# Patient Record
Sex: Female | Born: 1965
Health system: Southern US, Community
[De-identification: ages and names within clinical notes are randomized; demographics above are authoritative.]

## PROBLEM LIST (undated history)

## (undated) DIAGNOSIS — F419 Anxiety disorder, unspecified: Secondary | ICD-10-CM

## (undated) DIAGNOSIS — M1990 Unspecified osteoarthritis, unspecified site: Secondary | ICD-10-CM

## (undated) DIAGNOSIS — E119 Type 2 diabetes mellitus without complications: Secondary | ICD-10-CM

## (undated) DIAGNOSIS — I10 Essential (primary) hypertension: Secondary | ICD-10-CM

## (undated) DIAGNOSIS — T7840XA Allergy, unspecified, initial encounter: Secondary | ICD-10-CM

## (undated) HISTORY — DX: Essential (primary) hypertension: I10

## (undated) HISTORY — DX: Anxiety disorder, unspecified: F41.9

## (undated) HISTORY — DX: Type 2 diabetes mellitus without complications: E11.9

## (undated) HISTORY — DX: Allergy, unspecified, initial encounter: T78.40XA

## (undated) HISTORY — PX: TONSILLECTOMY: SUR1361

---

## 2004-03-23 ENCOUNTER — Inpatient Hospital Stay: Payer: Self-pay

## 2004-04-05 HISTORY — PX: DILATION AND CURETTAGE OF UTERUS: SHX78

## 2004-09-01 ENCOUNTER — Ambulatory Visit: Payer: Self-pay

## 2004-12-30 ENCOUNTER — Inpatient Hospital Stay: Payer: Self-pay

## 2005-01-12 ENCOUNTER — Ambulatory Visit: Payer: Self-pay | Admitting: Obstetrics & Gynecology

## 2005-02-03 ENCOUNTER — Ambulatory Visit: Payer: Self-pay | Admitting: Obstetrics & Gynecology

## 2005-02-05 ENCOUNTER — Observation Stay: Payer: Self-pay

## 2005-02-26 ENCOUNTER — Observation Stay: Payer: Self-pay | Admitting: Obstetrics & Gynecology

## 2005-03-01 ENCOUNTER — Inpatient Hospital Stay: Payer: Self-pay

## 2005-03-05 ENCOUNTER — Ambulatory Visit: Payer: Self-pay | Admitting: Obstetrics & Gynecology

## 2007-08-17 ENCOUNTER — Ambulatory Visit: Payer: Self-pay | Admitting: Family Medicine

## 2013-03-05 ENCOUNTER — Ambulatory Visit (INDEPENDENT_AMBULATORY_CARE_PROVIDER_SITE_OTHER): Payer: 59 | Admitting: Family Medicine

## 2013-03-05 ENCOUNTER — Encounter: Payer: Self-pay | Admitting: Family Medicine

## 2013-03-05 VITALS — BP 140/98 | HR 83 | Ht 67.0 in | Wt 215.0 lb

## 2013-03-05 DIAGNOSIS — I119 Hypertensive heart disease without heart failure: Secondary | ICD-10-CM | POA: Insufficient documentation

## 2013-03-05 DIAGNOSIS — R7303 Prediabetes: Secondary | ICD-10-CM | POA: Insufficient documentation

## 2013-03-05 DIAGNOSIS — R7309 Other abnormal glucose: Secondary | ICD-10-CM

## 2013-03-05 DIAGNOSIS — I10 Essential (primary) hypertension: Secondary | ICD-10-CM

## 2013-03-05 DIAGNOSIS — Z1151 Encounter for screening for human papillomavirus (HPV): Secondary | ICD-10-CM

## 2013-03-05 DIAGNOSIS — Z124 Encounter for screening for malignant neoplasm of cervix: Secondary | ICD-10-CM

## 2013-03-05 DIAGNOSIS — Z01419 Encounter for gynecological examination (general) (routine) without abnormal findings: Secondary | ICD-10-CM

## 2013-03-05 DIAGNOSIS — Z3041 Encounter for surveillance of contraceptive pills: Secondary | ICD-10-CM

## 2013-03-05 LAB — CBC
Hemoglobin: 13.5 g/dL (ref 12.0–15.0)
MCH: 29 pg (ref 26.0–34.0)
MCHC: 34.5 g/dL (ref 30.0–36.0)
MCV: 83.9 fL (ref 78.0–100.0)
RDW: 14.2 % (ref 11.5–15.5)

## 2013-03-05 MED ORDER — NORGESTIMATE-ETH ESTRADIOL 0.25-35 MG-MCG PO TABS: 1.0000 | ORAL_TABLET | Freq: Every day | ORAL | Status: DC

## 2013-03-05 NOTE — Progress Notes (Signed)
  Subjective:     Mackenzie Clark is a 47 y.o. female and is here for a comprehensive physical exam. The patient reports no problems. Seen by PCP with pre-diabetes, treated with weight loss.  Last Hgb A1C was 6.2 on no meds.  On OC's for contraception and cycle control.  On 2 medications for her BP. Discussed contraceptive options that do not contain estrogen to assist with BP control.  Per pt., BP is usually not this far elevated.  Had normal mammogram in July.  She is a CMA at [employer].  History   Social History  . Marital Status: Married    Spouse Name: N/A    Number of Children: N/A  . Years of Education: N/A   Occupational History  . Not on file.   Social History Main Topics  . Smoking status: Never Smoker   . Smokeless tobacco: Never Used  . Alcohol Use: Yes     Comment: occasionally  . Drug Use: No  . Sexual Activity: Yes    Partners: Male    Birth Control/ Protection: OCP, Pill   Other Topics Concern  . Not on file   Social History Narrative  . No narrative on file   No health maintenance topics applied.  The following portions of the patient's history were reviewed and updated as appropriate: allergies, current medications, past family history, past medical history, past social history, past surgical history and problem list.  Review of Systems A comprehensive review of systems was negative.   Objective:    BP 140/98  Pulse 83  Ht 5\' 7"  (1.702 m)  Wt 215 lb (97.523 kg)  BMI 33.67 kg/m2  LMP 02/28/2013 General appearance: alert, cooperative and appears stated age Head: Normocephalic, without obvious abnormality, atraumatic Neck: no adenopathy, supple, symmetrical, trachea midline and thyroid not enlarged, symmetric, no tenderness/mass/nodules Lungs: clear to auscultation bilaterally Breasts: normal appearance, no masses or tenderness Heart: regular rate and rhythm, S1, S2 normal, no murmur, click, rub or gallop Abdomen: soft, non-tender; bowel sounds  normal; no masses,  no organomegaly Pelvic: cervix normal in appearance, external genitalia normal, no adnexal masses or tenderness, no cervical motion tenderness, uterus normal size, shape, and consistency and vagina normal without discharge Extremities: extremities normal, atraumatic, no cyanosis or edema Pulses: 2+ and symmetric Skin: Skin color, texture, turgor normal. No rashes or lesions Lymph nodes: Cervical, supraclavicular, and axillary nodes normal. Neurologic: Grossly normal    Assessment:    Healthy female exam. HTN, pre-diabetes.      Plan:    Pap Smear--s/p flu and mammogram. Fasting blood work Contraception options reviewed.  Have refilled her OC's for now--she should watch BP and consider switch to progestin only or barrier method if BP remains high. See After Visit Summary for Counseling Recommendations

## 2013-03-05 NOTE — Patient Instructions (Signed)
Contraception Choices Contraception (birth control) is the use of any methods or devices to prevent pregnancy. Below are some methods to help avoid pregnancy. HORMONAL METHODS   Contraceptive implant This is a thin, plastic tube containing progesterone hormone. It does not contain estrogen hormone. Your health care provider inserts the tube in the inner part of the upper arm. The tube can remain in place for up to 3 years. After 3 years, the implant must be removed. The implant prevents the ovaries from releasing an egg (ovulation), thickens the cervical mucus to prevent sperm from entering the uterus, and thins the lining of the inside of the uterus.  Progesterone-only injections These injections are given every 3 months by your health care provider to prevent pregnancy. This synthetic progesterone hormone stops the ovaries from releasing eggs. It also thickens cervical mucus and changes the uterine lining. This makes it harder for sperm to survive in the uterus.  Birth control pills These pills contain estrogen and progesterone hormone. They work by preventing the ovaries from releasing eggs (ovulation). They also cause the cervical mucus to thicken, preventing the sperm from entering the uterus. Birth control pills are prescribed by a health care provider.Birth control pills can also be used to treat heavy periods.  Minipill This type of birth control pill contains only the progesterone hormone. They are taken every day of each month and must be prescribed by your health care provider.  Birth control patch The patch contains hormones similar to those in birth control pills. It must be changed once a week and is prescribed by a health care provider.  Vaginal ring The ring contains hormones similar to those in birth control pills. It is left in the vagina for 3 weeks, removed for 1 week, and then a new one is put back in place. The patient must be comfortable inserting and removing the ring from the  vagina.A health care provider's prescription is necessary.  Emergency contraception Emergency contraceptives prevent pregnancy after unprotected sexual intercourse. This pill can be taken right after sex or up to 5 days after unprotected sex. It is most effective the sooner you take the pills after having sexual intercourse. Most emergency contraceptive pills are available without a prescription. Check with your pharmacist. Do not use emergency contraception as your only form of birth control. BARRIER METHODS   Female condom This is a thin sheath (latex or rubber) that is worn over the penis during sexual intercourse. It can be used with spermicide to increase effectiveness.  Female condom. This is a soft, loose-fitting sheath that is put into the vagina before sexual intercourse.  Diaphragm This is a soft, latex, dome-shaped barrier that must be fitted by a health care provider. It is inserted into the vagina, along with a spermicidal jelly. It is inserted before intercourse. The diaphragm should be left in the vagina for 6 to 8 hours after intercourse.  Cervical cap This is a round, soft, latex or plastic cup that fits over the cervix and must be fitted by a health care provider. The cap can be left in place for up to 48 hours after intercourse.  Sponge This is a soft, circular piece of polyurethane foam. The sponge has spermicide in it. It is inserted into the vagina after wetting it and before sexual intercourse.  Spermicides These are chemicals that kill or block sperm from entering the cervix and uterus. They come in the form of creams, jellies, suppositories, foam, or tablets. They do not require a   prescription. They are inserted into the vagina with an applicator before having sexual intercourse. The process must be repeated every time you have sexual intercourse. INTRAUTERINE CONTRACEPTION  Intrauterine device (IUD) This is a T-shaped device that is put in a woman's uterus during a  menstrual period to prevent pregnancy. There are 2 types:  Copper IUD This type of IUD is wrapped in copper wire and is placed inside the uterus. Copper makes the uterus and fallopian tubes produce a fluid that kills sperm. It can stay in place for 10 years.  Hormone IUD This type of IUD contains the hormone progestin (synthetic progesterone). The hormone thickens the cervical mucus and prevents sperm from entering the uterus, and it also thins the uterine lining to prevent implantation of a fertilized egg. The hormone can weaken or kill the sperm that get into the uterus. It can stay in place for 3 5 years, depending on which type of IUD is used. PERMANENT METHODS OF CONTRACEPTION  Female tubal ligation This is when the woman's fallopian tubes are surgically sealed, tied, or blocked to prevent the egg from traveling to the uterus.  Hysteroscopic sterilization This involves placing a small coil or insert into each fallopian tube. Your doctor uses a technique called hysteroscopy to do the procedure. The device causes scar tissue to form. This results in permanent blockage of the fallopian tubes, so the sperm cannot fertilize the egg. It takes about 3 months after the procedure for the tubes to become blocked. You must use another form of birth control for these 3 months.  Female sterilization This is when the female has the tubes that carry sperm tied off (vasectomy).This blocks sperm from entering the vagina during sexual intercourse. After the procedure, the man can still ejaculate fluid (semen). NATURAL PLANNING METHODS  Natural family planning This is not having sexual intercourse or using a barrier method (condom, diaphragm, cervical cap) on days the woman could become pregnant.  Calendar method This is keeping track of the length of each menstrual cycle and identifying when you are fertile.  Ovulation method This is avoiding sexual intercourse during ovulation.  Symptothermal method This is  avoiding sexual intercourse during ovulation, using a thermometer and ovulation symptoms.  Post ovulation method This is timing sexual intercourse after you have ovulated. Regardless of which type or method of contraception you choose, it is important that you use condoms to protect against the transmission of sexually transmitted infections (STIs). Talk with your health care provider about which form of contraception is most appropriate for you. Document Released: 03/22/2005 Document Revised: 11/22/2012 Document Reviewed: 09/14/2012 [facility] Patient Information 2014 [place], [place].  Preventive Care for Adults, Female A healthy lifestyle and preventive care can promote health and wellness. Preventive health guidelines for women include the following key practices.  A routine yearly physical is a good way to check with your caregiver about your health and preventive screening. It is a chance to share any concerns and updates on your health, and to receive a thorough exam.  Visit your dentist for a routine exam and preventive care every 6 months. Brush your teeth twice a day and floss once a day. Good oral hygiene prevents tooth decay and gum disease.  The frequency of eye exams is based on your age, health, family medical history, use of contact lenses, and other factors. Follow your caregiver's recommendations for frequency of eye exams.  Eat a healthy diet. Foods like vegetables, fruits, whole grains, low-fat dairy products, and lean protein  foods contain the nutrients you need without too many calories. Decrease your intake of foods high in solid fats, added sugars, and salt. Eat the right amount of calories for you.Get information about a proper diet from your caregiver, if necessary.  Regular physical exercise is one of the most important things you can do for your health. Most adults should get at least 150 minutes of moderate-intensity exercise (any activity that increases your heart rate  and causes you to sweat) each week. In addition, most adults need muscle-strengthening exercises on 2 or more days a week.  Maintain a healthy weight. The body mass index (BMI) is a screening tool to identify possible weight problems. It provides an estimate of body fat based on height and weight. Your caregiver can help determine your BMI, and can help you achieve or maintain a healthy weight.For adults 20 years and older:  A BMI below 18.5 is considered underweight.  A BMI of 18.5 to 24.9 is normal.  A BMI of 25 to 29.9 is considered overweight.  A BMI of 30 and above is considered obese.  Maintain normal blood lipids and cholesterol levels by exercising and minimizing your intake of saturated fat. Eat a balanced diet with plenty of fruit and vegetables. Blood tests for lipids and cholesterol should begin at age 55 and be repeated every 5 years. If your lipid or cholesterol levels are high, you are over 50, or you are at high risk for heart disease, you may need your cholesterol levels checked more frequently.Ongoing high lipid and cholesterol levels should be treated with medicines if diet and exercise are not effective.  If you smoke, find out from your caregiver how to quit. If you do not use tobacco, do not start.  Lung cancer screening is recommended for adults aged 46 80 years who are at high risk for developing lung cancer because of a history of smoking. Yearly low-dose computed tomography (CT) is recommended for people who have at least a 30-pack-year history of smoking and are a current smoker or have quit within the past 15 years. A pack year of smoking is smoking an average of 1 pack of cigarettes a day for 1 year (for example: 1 pack a day for 30 years or 2 packs a day for 15 years). Yearly screening should continue until the smoker has stopped smoking for at least 15 years. Yearly screening should also be stopped for people who develop a health problem that would prevent them from  having lung cancer treatment.  If you are pregnant, do not drink alcohol. If you are breastfeeding, be very cautious about drinking alcohol. If you are not pregnant and choose to drink alcohol, do not exceed 1 drink per day. One drink is considered to be 12 ounces (355 mL) of beer, 5 ounces (148 mL) of wine, or 1.5 ounces (44 mL) of liquor.  Avoid use of street drugs. Do not share needles with anyone. Ask for help if you need support or instructions about stopping the use of drugs.  High blood pressure causes heart disease and increases the risk of stroke. Your blood pressure should be checked at least every 1 to 2 years. Ongoing high blood pressure should be treated with medicines if weight loss and exercise are not effective.  If you are 68 to 45 years old, ask your caregiver if you should take aspirin to prevent strokes.  Diabetes screening involves taking a blood sample to check your fasting blood sugar level. This should  be done once every 3 years, after age 28, if you are within normal weight and without risk factors for diabetes. Testing should be considered at a younger age or be carried out more frequently if you are overweight and have at least 1 risk factor for diabetes.  Breast cancer screening is essential preventive care for women. You should practice "breast self-awareness." This means understanding the normal appearance and feel of your breasts and may include breast self-examination. Any changes detected, no matter how small, should be reported to a caregiver. Women in their 110s and 30s should have a clinical breast exam (CBE) by a caregiver as part of a regular health exam every 1 to 3 years. After age 54, women should have a CBE every year. Starting at age 20, women should consider having a mammography (breast X-ray test) every year. Women who have a family history of breast cancer should talk to their caregiver about genetic screening. Women at a high risk of breast cancer should talk  to their caregivers about having magnetic resonance imaging (MRI) and a mammography every year.  Breast cancer gene (BRCA)-related cancer risk assessment is recommended for women who have family members with BRCA-related cancers. BRCA-related cancers include breast, ovarian, tubal, and peritoneal cancers. Having family members with these cancers may be associated with an increased risk for harmful changes (mutations) in the breast cancer genes BRCA1 and BRCA2. Results of the assessment will determine the need for genetic counseling and BRCA1 and BRCA2 testing.  The Pap test is a screening test for cervical cancer. A Pap test can show cell changes on the cervix that might become cervical cancer if left untreated. A Pap test is a procedure in which cells are obtained and examined from the lower end of the uterus (cervix).  Women should have a Pap test starting at age 68.  Between ages 35 and 61, Pap tests should be repeated every 2 years.  Beginning at age 64, you should have a Pap test every 3 years as long as the past 3 Pap tests have been normal.  Some women have medical problems that increase the chance of getting cervical cancer. Talk to your caregiver about these problems. It is especially important to talk to your caregiver if a new problem develops soon after your last Pap test. In these cases, your caregiver may recommend more frequent screening and Pap tests.  The above recommendations are the same for women who have or have not gotten the vaccine for human papillomavirus (HPV).  If you had a hysterectomy for a problem that was not cancer or a condition that could lead to cancer, then you no longer need Pap tests. Even if you no longer need a Pap test, a regular exam is a good idea to make sure no other problems are starting.  If you are between ages 16 and 83, and you have had normal Pap tests going back 10 years, you no longer need Pap tests. Even if you no longer need a Pap test, a  regular exam is a good idea to make sure no other problems are starting.  If you have had past treatment for cervical cancer or a condition that could lead to cancer, you need Pap tests and screening for cancer for at least 20 years after your treatment.  If Pap tests have been discontinued, risk factors (such as a new sexual partner) need to be reassessed to determine if screening should be resumed.  The HPV test is an  additional test that may be used for cervical cancer screening. The HPV test looks for the virus that can cause the cell changes on the cervix. The cells collected during the Pap test can be tested for HPV. The HPV test could be used to screen women aged 85 years and older, and should be used in women of any age who have unclear Pap test results. After the age of 73, women should have HPV testing at the same frequency as a Pap test.  Colorectal cancer can be detected and often prevented. Most routine colorectal cancer screening begins at the age of 63 and continues through age 68. However, your caregiver may recommend screening at an earlier age if you have risk factors for colon cancer. On a yearly basis, your caregiver may provide home test kits to check for hidden blood in the stool. Use of a small camera at the end of a tube, to directly examine the colon (sigmoidoscopy or colonoscopy), can detect the earliest forms of colorectal cancer. Talk to your caregiver about this at age 34, when routine screening begins. Direct examination of the colon should be repeated every 5 to 10 years through age 71, unless early forms of pre-cancerous polyps or small growths are found.  Hepatitis C blood testing is recommended for all people born from [birth year] through 1963 and any individual with known risks for hepatitis C.  Practice safe sex. Use condoms and avoid high-risk sexual practices to reduce the spread of sexually transmitted infections (STIs). STIs include gonorrhea, chlamydia, syphilis,  trichomonas, herpes, HPV, and human immunodeficiency virus (HIV). Herpes, HIV, and HPV are viral illnesses that have no cure. They can result in disability, cancer, and death. Sexually active women aged 83 and younger should be checked for chlamydia. Older women with new or multiple partners should also be tested for chlamydia. Testing for other STIs is recommended if you are sexually active and at increased risk.  Osteoporosis is a disease in which the bones lose minerals and strength with aging. This can result in serious bone fractures. The risk of osteoporosis can be identified using a bone density scan. Women ages 7 and over and women at risk for fractures or osteoporosis should discuss screening with their caregivers. Ask your caregiver whether you should take a calcium supplement or vitamin D to reduce the rate of osteoporosis.  Menopause can be associated with physical symptoms and risks. Hormone replacement therapy is available to decrease symptoms and risks. You should talk to your caregiver about whether hormone replacement therapy is right for you.  Use sunscreen. Apply sunscreen liberally and repeatedly throughout the day. You should seek shade when your shadow is shorter than you. Protect yourself by wearing long sleeves, pants, a wide-brimmed hat, and sunglasses year round, whenever you are outdoors.  Once a month, do a whole body skin exam, using a mirror to look at the skin on your back. Notify your caregiver of new moles, moles that have irregular borders, moles that are larger than a pencil eraser, or moles that have changed in shape or color.  Stay current with required immunizations.  Influenza vaccine. All adults should be immunized every year.  Tetanus, diphtheria, and acellular pertussis (Td, Tdap) vaccine. Pregnant women should receive 1 dose of Tdap vaccine during each pregnancy. The dose should be obtained regardless of the length of time since the last dose. Immunization is  preferred during the 27th to 36th week of gestation. An adult who has not previously received Tdap or  who does not know her vaccine status should receive 1 dose of Tdap. This initial dose should be followed by tetanus and diphtheria toxoids (Td) booster doses every 10 years. Adults with an unknown or incomplete history of completing a 3-dose immunization series with Td-containing vaccines should begin or complete a primary immunization series including a Tdap dose. Adults should receive a Td booster every 10 years.  Varicella vaccine. An adult without evidence of immunity to varicella should receive 2 doses or a second dose if she has previously received 1 dose. Pregnant females who do not have evidence of immunity should receive the first dose after pregnancy. This first dose should be obtained before leaving the health care facility. The second dose should be obtained 4 8 weeks after the first dose.  Human papillomavirus (HPV) vaccine. Females aged 61 26 years who have not received the vaccine previously should obtain the 3-dose series. The vaccine is not recommended for use in pregnant females. However, pregnancy testing is not needed before receiving a dose. If a female is found to be pregnant after receiving a dose, no treatment is needed. In that case, the remaining doses should be delayed until after the pregnancy. Immunization is recommended for any person with an immunocompromised condition through the age of 26 years if she did not get any or all doses earlier. During the 3-dose series, the second dose should be obtained 4 8 weeks after the first dose. The third dose should be obtained 24 weeks after the first dose and 16 weeks after the second dose.  Zoster vaccine. One dose is recommended for adults aged 63 years or older unless certain conditions are present.  Measles, mumps, and rubella (MMR) vaccine. Adults born before [birth year] generally are considered immune to measles and mumps. Adults born in  2 or later should have 1 or more doses of MMR vaccine unless there is a contraindication to the vaccine or there is laboratory evidence of immunity to each of the three diseases. A routine second dose of MMR vaccine should be obtained at least 28 days after the first dose for students attending postsecondary schools, health care workers, or international travelers. People who received inactivated measles vaccine or an unknown type of measles vaccine during 1963 1967 should receive 2 doses of MMR vaccine. People who received inactivated mumps vaccine or an unknown type of mumps vaccine before 1979 and are at high risk for mumps infection should consider immunization with 2 doses of MMR vaccine. For females of childbearing age, rubella immunity should be determined. If there is no evidence of immunity, females who are not pregnant should be vaccinated. If there is no evidence of immunity, females who are pregnant should delay immunization until after pregnancy. Unvaccinated health care workers born before [birth year] who lack laboratory evidence of measles, mumps, or rubella immunity or laboratory confirmation of disease should consider measles and mumps immunization with 2 doses of MMR vaccine or rubella immunization with 1 dose of MMR vaccine.  Pneumococcal 13-valent conjugate (PCV13) vaccine. When indicated, a person who is uncertain of her immunization history and has no record of immunization should receive the PCV13 vaccine. An adult aged 23 years or older who has certain medical conditions and has not been previously immunized should receive 1 dose of PCV13 vaccine. This PCV13 should be followed with a dose of pneumococcal polysaccharide (PPSV23) vaccine. The PPSV23 vaccine dose should be obtained at least 8 weeks after the dose of PCV13 vaccine. An adult aged 62 years or  older who has certain medical conditions and previously received 1 or more doses of PPSV23 vaccine should receive 1 dose of PCV13. The PCV13  vaccine dose should be obtained 1 or more years after the last PPSV23 vaccine dose.  Pneumococcal polysaccharide (PPSV23) vaccine. When PCV13 is also indicated, PCV13 should be obtained first. All adults aged 81 years and older should be immunized. An adult younger than age 46 years who has certain medical conditions should be immunized. Any person who resides in a nursing home or long-term care facility should be immunized. An adult smoker should be immunized. People with an immunocompromised condition and certain other conditions should receive both PCV13 and PPSV23 vaccines. People with human immunodeficiency virus (HIV) infection should be immunized as soon as possible after diagnosis. Immunization during chemotherapy or radiation therapy should be avoided. Routine use of PPSV23 vaccine is not recommended for American Indians, [address], or people younger than 65 years unless there are medical conditions that require PPSV23 vaccine. When indicated, people who have unknown immunization and have no record of immunization should receive PPSV23 vaccine. One-time revaccination 5 years after the first dose of PPSV23 is recommended for people aged 45 64 years who have chronic kidney failure, nephrotic syndrome, asplenia, or immunocompromised conditions. People who received 1 2 doses of PPSV23 before age 31 years should receive another dose of PPSV23 vaccine at age 2 years or later if at least 5 years have passed since the previous dose. Doses of PPSV23 are not needed for people immunized with PPSV23 at or after age 41 years.  Meningococcal vaccine. Adults with asplenia or persistent complement component deficiencies should receive 2 doses of quadrivalent meningococcal conjugate (MenACWY-D) vaccine. The doses should be obtained at least 2 months apart. Microbiologists working with certain meningococcal bacteria, military recruits, people at risk during an outbreak, and people who travel to or live in countries  with a high rate of meningitis should be immunized. A first-year college student up through age 41 years who is living in a residence hall should receive a dose if she did not receive a dose on or after her 16th birthday. Adults who have certain high-risk conditions should receive one or more doses of vaccine.  Hepatitis A vaccine. Adults who wish to be protected from this disease, have certain high-risk conditions, work with hepatitis A-infected animals, work in hepatitis A research labs, or travel to or work in countries with a high rate of hepatitis A should be immunized. Adults who were previously unvaccinated and who anticipate close contact with an international adoptee during the first 60 days after arrival in the [place] from a country with a high rate of hepatitis A should be immunized.  Hepatitis B vaccine. Adults who wish to be protected from this disease, have certain high-risk conditions, may be exposed to blood or other infectious body fluids, are household contacts or sex partners of hepatitis B positive people, are clients or workers in certain care facilities, or travel to or work in countries with a high rate of hepatitis B should be immunized.  Haemophilus influenzae type b (Hib) vaccine. A previously unvaccinated person with asplenia or sickle cell disease or having a scheduled splenectomy should receive 1 dose of Hib vaccine. Regardless of previous immunization, a recipient of a hematopoietic stem cell transplant should receive a 3-dose series 6 12 months after her successful transplant. Hib vaccine is not recommended for adults with HIV infection. Preventive Services / Frequency Ages 71 to 82  Blood pressure  check.** / Every 1 to 2 years.  Lipid and cholesterol check.** / Every 5 years beginning at age 33.  Clinical breast exam.** / Every 3 years for women in their 13s and 30s.  BRCA-related cancer risk assessment.** / For women who have family members with a BRCA-related  cancer (breast, ovarian, tubal, or peritoneal cancers).  Pap test.** / Every 2 years from ages 63 through 49. Every 3 years starting at age 64 through age 43 or 34 with a history of 3 consecutive normal Pap tests.  HPV screening.** / Every 3 years from ages 77 through ages 62 to 10 with a history of 3 consecutive normal Pap tests.  Hepatitis C blood test.** / For any individual with known risks for hepatitis C.  Skin self-exam. / Monthly.  Influenza vaccine. / Every year.  Tetanus, diphtheria, and acellular pertussis (Tdap, Td) vaccine.** / Consult your caregiver. Pregnant women should receive 1 dose of Tdap vaccine during each pregnancy. 1 dose of Td every 10 years.  Varicella vaccine.** / Consult your caregiver. Pregnant females who do not have evidence of immunity should receive the first dose after pregnancy.  HPV vaccine. / 3 doses over 6 months, if 26 and younger. The vaccine is not recommended for use in pregnant females. However, pregnancy testing is not needed before receiving a dose.  Measles, mumps, rubella (MMR) vaccine.** / You need at least 1 dose of MMR if you were born in 1957 or later. You may also need a 2nd dose. For females of childbearing age, rubella immunity should be determined. If there is no evidence of immunity, females who are not pregnant should be vaccinated. If there is no evidence of immunity, females who are pregnant should delay immunization until after pregnancy.  Pneumococcal 13-valent conjugate (PCV13) vaccine.** / Consult your caregiver.  Pneumococcal polysaccharide (PPSV23) vaccine.** / 1 to 2 doses if you smoke cigarettes or if you have certain conditions.  Meningococcal vaccine.** / 1 dose if you are age 43 to 57 years and a Orthoptist living in a residence hall, or have one of several medical conditions, you need to get vaccinated against meningococcal disease. You may also need additional booster doses.  Hepatitis A vaccine.** /  Consult your caregiver.  Hepatitis B vaccine.** / Consult your caregiver.  Haemophilus influenzae type b (Hib) vaccine.** / Consult your caregiver. Ages 41 to 44  Blood pressure check.** / Every 1 to 2 years.  Lipid and cholesterol check.** / Every 5 years beginning at age 15.  Lung cancer screening. / Every year if you are aged 64 80 years and have a 30-pack-year history of smoking and currently smoke or have quit within the past 15 years. Yearly screening is stopped once you have quit smoking for at least 15 years or develop a health problem that would prevent you from having lung cancer treatment.  Clinical breast exam.** / Every year after age 12.  BRCA-related cancer risk assessment.** / For women who have family members with a BRCA-related cancer (breast, ovarian, tubal, or peritoneal cancers).  Mammogram.** / Every year beginning at age 30 and continuing for as long as you are in good health. Consult with your caregiver.  Pap test.** / Every 3 years starting at age 57 through age 92 or 70 with a history of 3 consecutive normal Pap tests.  HPV screening.** / Every 3 years from ages 71 through ages 39 to 7 with a history of 3 consecutive normal Pap tests.  Fecal occult  blood test (FOBT) of stool. / Every year beginning at age 31 and continuing until age 41. You may not need to do this test if you get a colonoscopy every 10 years.  Flexible sigmoidoscopy or colonoscopy.** / Every 5 years for a flexible sigmoidoscopy or every 10 years for a colonoscopy beginning at age 79 and continuing until age 55.  Hepatitis C blood test.** / For all people born from [birth year] through 1963 and any individual with known risks for hepatitis C.  Skin self-exam. / Monthly.  Influenza vaccine. / Every year.  Tetanus, diphtheria, and acellular pertussis (Tdap/Td) vaccine.** / Consult your caregiver. Pregnant women should receive 1 dose of Tdap vaccine during each pregnancy. 1 dose of Td every 10  years.  Varicella vaccine.** / Consult your caregiver. Pregnant females who do not have evidence of immunity should receive the first dose after pregnancy.  Zoster vaccine.** / 1 dose for adults aged 24 years or older.  Measles, mumps, rubella (MMR) vaccine.** / You need at least 1 dose of MMR if you were born in 1957 or later. You may also need a 2nd dose. For females of childbearing age, rubella immunity should be determined. If there is no evidence of immunity, females who are not pregnant should be vaccinated. If there is no evidence of immunity, females who are pregnant should delay immunization until after pregnancy.  Pneumococcal 13-valent conjugate (PCV13) vaccine.** / Consult your caregiver.  Pneumococcal polysaccharide (PPSV23) vaccine.** / 1 to 2 doses if you smoke cigarettes or if you have certain conditions.  Meningococcal vaccine.** / Consult your caregiver.  Hepatitis A vaccine.** / Consult your caregiver.  Hepatitis B vaccine.** / Consult your caregiver.  Haemophilus influenzae type b (Hib) vaccine.** / Consult your caregiver. Ages 29 and over  Blood pressure check.** / Every 1 to 2 years.  Lipid and cholesterol check.** / Every 5 years beginning at age 22.  Lung cancer screening. / Every year if you are aged 40 80 years and have a 30-pack-year history of smoking and currently smoke or have quit within the past 15 years. Yearly screening is stopped once you have quit smoking for at least 15 years or develop a health problem that would prevent you from having lung cancer treatment.  Clinical breast exam.** / Every year after age 75.  BRCA-related cancer risk assessment.** / For women who have family members with a BRCA-related cancer (breast, ovarian, tubal, or peritoneal cancers).  Mammogram.** / Every year beginning at age 2 and continuing for as long as you are in good health. Consult with your caregiver.  Pap test.** / Every 3 years starting at age 51 through age  40 or 99 with a 3 consecutive normal Pap tests. Testing can be stopped between 65 and 70 with 3 consecutive normal Pap tests and no abnormal Pap or HPV tests in the past 10 years.  HPV screening.** / Every 3 years from ages 45 through ages 35 or 69 with a history of 3 consecutive normal Pap tests. Testing can be stopped between 65 and 70 with 3 consecutive normal Pap tests and no abnormal Pap or HPV tests in the past 10 years.  Fecal occult blood test (FOBT) of stool. / Every year beginning at age 40 and continuing until age 39. You may not need to do this test if you get a colonoscopy every 10 years.  Flexible sigmoidoscopy or colonoscopy.** / Every 5 years for a flexible sigmoidoscopy or every 10 years for a colonoscopy beginning  at age 72 and continuing until age 44.  Hepatitis C blood test.** / For all people born from [birth year] through 1965 and any individual with known risks for hepatitis C.  Osteoporosis screening.** / A one-time screening for women ages 45 and over and women at risk for fractures or osteoporosis.  Skin self-exam. / Monthly.  Influenza vaccine. / Every year.  Tetanus, diphtheria, and acellular pertussis (Tdap/Td) vaccine.** / 1 dose of Td every 10 years.  Varicella vaccine.** / Consult your caregiver.  Zoster vaccine.** / 1 dose for adults aged 69 years or older.  Pneumococcal 13-valent conjugate (PCV13) vaccine.** / Consult your caregiver.  Pneumococcal polysaccharide (PPSV23) vaccine.** / 1 dose for all adults aged 52 years and older.  Meningococcal vaccine.** / Consult your caregiver.  Hepatitis A vaccine.** / Consult your caregiver.  Hepatitis B vaccine.** / Consult your caregiver.  Haemophilus influenzae type b (Hib) vaccine.** / Consult your caregiver. ** Family history and personal history of risk and conditions may change your caregiver's recommendations. Document Released: 05/18/2001 Document Revised: 07/17/2012 Document Reviewed: 08/17/2010 [facility]  Patient Information 2014 [place], [place].

## 2013-03-06 LAB — LIPID PANEL
HDL: 59 mg/dL (ref >39)
LDL Cholesterol: 88 mg/dL (ref 0–99)
VLDL: 14 mg/dL (ref 0–40)

## 2013-03-06 LAB — COMPREHENSIVE METABOLIC PANEL
ALT: 13 U/L (ref 0–35)
AST: 13 U/L (ref 0–37)
Albumin: 4.3 g/dL (ref 3.5–5.2)
Alkaline Phosphatase: 58 U/L (ref 39–117)
Glucose, Bld: 98 mg/dL (ref 70–99)
Potassium: 3.1 mEq/L — ABNORMAL LOW (ref 3.5–5.3)
Sodium: 139 mEq/L (ref 135–145)
Total Bilirubin: 0.5 mg/dL (ref 0.3–1.2)
Total Protein: 7.2 g/dL (ref 6.0–8.3)

## 2013-03-06 LAB — HEMOGLOBIN A1C
Hgb A1c MFr Bld: 6.6 % — ABNORMAL HIGH (ref <5.7)
Mean Plasma Glucose: 143 mg/dL — ABNORMAL HIGH (ref <117)

## 2013-03-08 NOTE — Progress Notes (Signed)
Left message for patient that her lab results are normal and if she would like details of her results she can call us back.

## 2013-07-14 ENCOUNTER — Encounter: Payer: 59 | Attending: Internal Medicine

## 2013-07-14 VITALS — Ht 67.0 in | Wt 224.8 lb

## 2013-07-14 DIAGNOSIS — Z713 Dietary counseling and surveillance: Secondary | ICD-10-CM | POA: Insufficient documentation

## 2013-07-14 DIAGNOSIS — E119 Type 2 diabetes mellitus without complications: Secondary | ICD-10-CM | POA: Insufficient documentation

## 2013-07-14 DIAGNOSIS — R7303 Prediabetes: Secondary | ICD-10-CM

## 2013-07-14 NOTE — Progress Notes (Signed)
Patient was seen on 07/14/13 for the complete diabetes self-management series at [facility]. This is a part of the Link to [employer].  Current A1c = 6.1%  Handouts given during class include:  Living Well with Diabetes book  Carb Counting and Meal Planning book  Meal Plan Card  Carbohydrate guide  Meal planning worksheet  Low Sodium Flavoring Tips  The diabetes portion plate  Low Carbohydrate Snack Suggestions  A1c to eAG Conversion Chart  Diabetes Medications  Stress Management  Diabetes Recommended Care Schedule  Diabetes Success Plan  Core Class Satisfaction Survey  The following learning objectives were met by the patient during this course:  Describe diabetes  State some common risk factors for diabetes  Defines the role of glucose and insulin  Identifies type of diabetes and pathophysiology  Describe the relationship between diabetes and cardiovascular risk  State the members of the Healthcare Team  States the rationale for glucose monitoring  State when to test glucose  State their individual Target Range  State the importance of logging glucose readings  Describe how to interpret glucose readings  Identifies A1C target  Explain the correlation between A1c and eAG values  State symptoms and treatment of high blood glucose  State symptoms and treatment of low blood glucose  Explain proper technique for glucose testing  Identifies proper sharps disposal  Describe the role of different macronutrients on glucose  Explain how carbohydrates affect blood glucose  State what foods contain the most carbohydrates  Demonstrate carbohydrate counting  Demonstrate how to read Nutrition Facts food label  Describe effects of various fats on heart health  Describe the importance of good nutrition for health and healthy eating strategies  Describe techniques for managing your shopping, cooking and meal  planning  List strategies to follow meal plan when dining out  Describe the effects of alcohol on glucose and how to use it safely   State the amount of activity recommended for healthy living   Describe activities suitable for individual needs   Identify ways to regularly incorporate activity into daily life   Identify barriers to activity and ways to over come these barriers  Identify diabetes medications being personally used and their primary action for lowering glucose and possible side effects   Describe role of stress on blood glucose and develop strategies to address psychosocial issues   Identify diabetes complications and ways to prevent them  Explain how to manage diabetes during illness   Evaluate success in meeting personal goal   Establish 2-3 goals that they will plan to diligently work on until they return for the  73-monthfollow-up visit  Goals:  Follow Diabetes Meal Plan as instructed  Eat 3 meals and 2 snacks, every 3-5 hrs  Limit carbohydrate intake to 45 grams carbohydrate/meal Limit carbohydrate intake to 15 grams carbohydrate/snack Add lean protein foods to meals/snacks  Monitor glucose levels as instructed by your doctor  Aim for 15-30 mins of physical activity daily as tolerated  Bring food record and glucose log to all healthcare visits  Your patient has established the following 4 month goals in their individualized success plan: I will count my carb choices at most meals and snacks I will increase my activity level at least 5 days a week I will take my diabetes medications as scheduled I will test my glucose at least 1 times a day, 1 days a week To help manage stress I will do meditation at least 3  times a week  Your patient has identified these potential barriers to change: Motivation  Your patient has identified their diabetes self-care support plan as [facility] American Diabetes Association web site Family support  Plan: Attend Core 4  in 4 months

## 2013-11-05 ENCOUNTER — Ambulatory Visit: Payer: 59

## 2014-02-17 ENCOUNTER — Ambulatory Visit: Payer: Self-pay | Admitting: Family Medicine

## 2014-07-05 ENCOUNTER — Other Ambulatory Visit: Payer: Self-pay

## 2014-07-05 NOTE — Patient Instructions (Signed)
1. Plan to exercise/walk 2 times a week for 30 minutes. Goal of 150 minutes a week   Diabetes and Exercise Exercising regularly is important. It is not just about losing weight. It has many health benefits, such as:  Improving your overall fitness, flexibility, and endurance.  Increasing your bone density.  Helping with weight control.  Decreasing your body fat.  Increasing your muscle strength.  Reducing stress and tension.  Improving your overall health. People with diabetes who exercise gain additional benefits because exercise:  Reduces appetite.  Improves the body's use of blood sugar (glucose).  Helps lower or control blood glucose.  Decreases blood pressure.  Helps control blood lipids (such as cholesterol and triglycerides).  Improves the body's use of the hormone insulin by:  Increasing the body's insulin sensitivity.  Reducing the body's insulin needs.  Decreases the risk for heart disease because exercising:  Lowers cholesterol and triglycerides levels.  Increases the levels of good cholesterol (such as high-density lipoproteins [HDL]) in the body.  Lowers blood glucose levels. YOUR ACTIVITY PLAN  Choose an activity that you enjoy and set realistic goals. Your health care provider or diabetes educator can help you make an activity plan that works for you. Exercise regularly as directed by your health care provider. This includes:  Performing resistance training twice a week such as push-ups, sit-ups, lifting weights, or using resistance bands.  Performing 150 minutes of cardio exercises each week such as walking, running, or playing sports.  Staying active and spending no more than 90 minutes at one time being inactive. Even short bursts of exercise are good for you. Three 10-minute sessions spread throughout the day are just as beneficial as a single 30-minute session. Some exercise ideas include:  Taking the dog for a walk.  Taking the stairs  instead of the elevator.  Dancing to your favorite song.  Doing an exercise video.  Doing your favorite exercise with a friend. RECOMMENDATIONS FOR EXERCISING WITH TYPE 1 OR TYPE 2 DIABETES   Check your blood glucose before exercising. If blood glucose levels are greater than 240 mg/dL, check for urine ketones. Do not exercise if ketones are present.  Avoid injecting insulin into areas of the body that are going to be exercised. For example, avoid injecting insulin into:  The arms when playing tennis.  The legs when jogging.  Keep a record of:  Food intake before and after you exercise.  Expected peak times of insulin action.  Blood glucose levels before and after you exercise.  The type and amount of exercise you have done.  Review your records with your health care provider. Your health care provider will help you to develop guidelines for adjusting food intake and insulin amounts before and after exercising.  If you take insulin or oral hypoglycemic agents, watch for signs and symptoms of hypoglycemia. They include:  Dizziness.  Shaking.  Sweating.  Chills.  Confusion.  Drink plenty of water while you exercise to prevent dehydration or heat stroke. Body water is lost during exercise and must be replaced.  Talk to your health care provider before starting an exercise program to make sure it is safe for you. Remember, almost any type of activity is better than none. Document Released: 06/12/2003 Document Revised: 08/06/2013 Document Reviewed: 08/29/2012 [facility] Patient Information 2015 [place], [place]. This information is not intended to replace advice given to you by your health care provider. Make sure you discuss any questions you have with your health care provider.

## 2014-07-05 NOTE — Patient Outreach (Signed)
[facility]) Care Management   07/05/2014  Mackenzie Clark 1966-01-26 [id number]  Mackenzie Clark is an 49 y.o. female seen for Link to Wellness self management program for Type 2 diabetes  Subjective:  Member states that she has been doing well and she has lost weight.  States that she has been limiting the carbohyrates she eats and her portion sizes.  States she has not been exercising regularly but she does a lot of walking at work  Objective:   Review of Systems  All other systems reviewed and are negative.   Physical Exam  Filed Vitals:   07/05/14 1229  BP: 136/88  Pulse: 74    Current Medications:   Current Outpatient Prescriptions  Medication Sig Dispense Refill  . Liraglutide (VICTOZA) 18 MG/3ML SOPN Inject 1.8 mg into the skin daily.    Marland Kitchen lisinopril-hydrochlorothiazide (PRINZIDE,ZESTORETIC) 20-25 MG per tablet Take 1 tablet by mouth daily.    . metFORMIN (GLUCOPHAGE) 500 MG tablet Take 500 mg by mouth daily with breakfast.    . norgestimate-ethinyl estradiol (ORTHO-CYCLEN,SPRINTEC,PREVIFEM) 0.25-35 MG-MCG tablet Take 1 tablet by mouth daily. 1 Package 3  . lisinopril-hydrochlorothiazide (PRINZIDE,ZESTORETIC) 20-12.5 MG per tablet Take 1 tablet by mouth daily.     No current facility-administered medications for this visit.    Functional Status:   In your present state of health, do you have any difficulty performing the following activities: 07/05/2014  Is the patient deaf or have difficulty hearing? N  Hearing N  Vision N  Difficulty concentrating or making decisions N  Walking or climbing stairs? N  Doing errands, shopping? N    Fall/Depression Screening:    PHQ 2/9 Scores 07/05/2014 07/14/2013  PHQ - 2 Score 0 0   [facility]N CM Care Plan        Patient Outreach from 07/05/2014 in [place] [facility]  Potential for elevated blood sugars related to dx of diabetes   Care Plan for Problem One  Active   Interventions for Problem One Long Term Goal  Reviewed carbohydrate counting and portion control, Reinforced importance of exercise in lowering blood sugars, Encouraged to make appt with prirmary care MD  in May    [facility] Long Term Goal (31-90 days)  Plan to maintain hemoglobin A1C at or below 6.5   [facility] Long Term Goal Start Date  07/05/14      Assessment:  Member's diabetes is well controled with last hemoglobin A1C of 6.3 on 05/17/14 at MD office.    Plan: Plan to see member in July for Link to Wellness visit.    [name] RN, [facility] Care Management [phone]

## 2014-11-13 ENCOUNTER — Ambulatory Visit: Payer: 59

## 2015-02-07 ENCOUNTER — Other Ambulatory Visit: Payer: Self-pay

## 2015-02-07 NOTE — Patient Outreach (Signed)
[facility] Care Management  02/07/2015  Sherina Stammer [birth year] [id number]  Phone call to schedule appointment. Member last seen 07/05/14.  No answer and message left.  Plan to send appointment request letter.  [name] RN, [facility] Care Management Coordinator-Link to [place] Management [phone]

## 2015-03-05 ENCOUNTER — Ambulatory Visit: Payer: 59

## 2015-03-05 ENCOUNTER — Other Ambulatory Visit: Payer: Self-pay

## 2015-03-05 VITALS — BP 138/88 | HR 75 | Resp 16 | Ht 67.0 in | Wt 213.1 lb

## 2015-03-05 DIAGNOSIS — R7303 Prediabetes: Secondary | ICD-10-CM

## 2015-03-05 NOTE — Patient Instructions (Signed)
1. Plan to eat 30-45 gm (2-3 servings) carbohydrates a meal and 15 gm for snacks.  Plan to use My Fitness Pal or Lose It to monitor carbohydrates and calories 2. Plan to exercise/walk 3-4 times a week for 20 minutes. Goal of 150 minutes a week 3. Plan to work on losing 10% of your weight by exercising more and returning to watching calories 4. Plan to see provider in December. 5. Plan to return to Link to Wellness on 06/11/15 at [facility]

## 2015-03-05 NOTE — Patient Outreach (Signed)
[facility]) Care Management   03/05/2015  Mackenzie Clark, Mackenzie Clark [id number]  Mackenzie Clark is an 49 y.o. female.   Member seen for follow up office visit for Link to Wellness program for self management of prediabetes  Subjective: States that she saw her provider in July and her hemoglobin A1C was 5.8.  States she is watching her portion sizes and she tries to make healthy choices.  States she had been walking with her son three times a week until it got cold and she has not found another activity to do while the weather is colder.  Objective:   Review of Systems  All other systems reviewed and are negative.   Physical Exam Today's Vitals   03/05/15 1415  BP: 138/88  Pulse: 75  Resp: 16  Height: 1.702 m (5\' 7" )  Weight: 213 lb 1.6 oz (96.662 kg)  SpO2: 98%  PainSc: 0-No pain   Current Medications:   Current Outpatient Prescriptions  Medication Sig Dispense Refill  . lisinopril-hydrochlorothiazide (PRINZIDE,ZESTORETIC) 20-25 MG per tablet Take 1 tablet by mouth daily.    . metFORMIN (GLUCOPHAGE) 500 MG tablet Take 500 mg by mouth daily with breakfast.    . norgestimate-ethinyl estradiol (ORTHO-CYCLEN,SPRINTEC,PREVIFEM) 0.25-35 MG-MCG tablet Take 1 tablet by mouth daily. 1 Package 3  . Liraglutide (VICTOZA) 18 MG/3ML SOPN Inject 1.8 mg into the skin daily.    Marland Kitchen lisinopril-hydrochlorothiazide (PRINZIDE,ZESTORETIC) 20-12.5 MG per tablet Take 1 tablet by mouth daily.     No current facility-administered medications for this visit.    Functional Status:   In your present state of health, do you have any difficulty performing the following activities: 03/05/2015 07/05/2014  Hearing? N N  Vision? N N  Difficulty concentrating or making decisions? N N  Walking or climbing stairs? N N  Dressing or bathing? N N  Doing errands, shopping? N N    Fall/Depression Screening:    PHQ 2/9 Scores 03/05/2015 07/05/2014 07/14/2013  PHQ - 2 Score 0 0 0     Assessment:   Member seen for follow up office visit for Link to Wellness program for self management of  Prediabetes.  Member at goal with last hemoglobin A1C of 5.8.  Reports following a low CHO diet and she had been exercising until weather became cold.  Plan:   Plan to eat 30-45 gm (2-3 servings) carbohydrates a meal and 15 gm for snacks.  Plan to use My Fitness Pal or Lose It to monitor carbohydrates and calories Plan to exercise/walk 3-4 times a week for 20 minutes. Goal of 150 minutes a week Plan to work on losing 10% of your weight by exercising more and returning to watching calories Plan to see provider in December. Plan to return to Link to Wellness on 06/11/15 at [place] [facility]        Most Recent Value   Care Plan Problem One  Potential for elevated blood sugars related to dx of diabetes   Role Documenting the Problem One  Care Management Coordinator   Care Plan for Problem One  Active   [facility]N Long Term Goal (31-90 days)  Plan to maintain hemoglobin A1C at or below 6.5   [facility]N Long Term Goal Start Date  03/05/15 [hemoglobin A1C 5.9 on 10/31/14]   Interventions for Problem One Long Term Goal  Reviewed carbohydrate counting and portion control,Discussed imoportance of weight loss for blood sugars,  Reinforced importance of exercise in lowering blood sugars, Discussed  ways to get exercise when the weather is cold,Encouraged to keep appt with prirmary care MD  in December

## 2015-04-21 DIAGNOSIS — H5213 Myopia, bilateral: Secondary | ICD-10-CM | POA: Diagnosis not present

## 2015-04-21 DIAGNOSIS — H524 Presbyopia: Secondary | ICD-10-CM | POA: Diagnosis not present

## 2015-04-21 DIAGNOSIS — H52223 Regular astigmatism, bilateral: Secondary | ICD-10-CM | POA: Diagnosis not present

## 2015-06-11 ENCOUNTER — Ambulatory Visit: Payer: Self-pay

## 2015-07-21 DIAGNOSIS — Z79899 Other long term (current) drug therapy: Secondary | ICD-10-CM | POA: Diagnosis not present

## 2015-07-21 DIAGNOSIS — I10 Essential (primary) hypertension: Secondary | ICD-10-CM | POA: Diagnosis not present

## 2015-07-21 DIAGNOSIS — R7309 Other abnormal glucose: Secondary | ICD-10-CM | POA: Diagnosis not present

## 2015-07-23 ENCOUNTER — Telehealth: Payer: Self-pay

## 2015-07-29 ENCOUNTER — Telehealth: Payer: Self-pay

## 2015-07-30 ENCOUNTER — Ambulatory Visit: Payer: Self-pay | Admitting: Physician Assistant

## 2015-07-30 ENCOUNTER — Encounter: Payer: Self-pay | Admitting: Physician Assistant

## 2015-07-30 VITALS — BP 130/100 | Temp 98.5°F

## 2015-07-30 DIAGNOSIS — N390 Urinary tract infection, site not specified: Secondary | ICD-10-CM

## 2015-07-30 DIAGNOSIS — R300 Dysuria: Secondary | ICD-10-CM

## 2015-07-30 LAB — POCT URINALYSIS DIPSTICK
Bilirubin, UA: NEGATIVE
Glucose, UA: NEGATIVE
Ketones, UA: NEGATIVE
NITRITE UA: NEGATIVE
PROTEIN UA: NEGATIVE
SPEC GRAV UA: 1.02
UROBILINOGEN UA: 0.2
pH, UA: 6.5

## 2015-07-30 MED ORDER — FLUCONAZOLE 150 MG PO TABS: 150.0000 mg | ORAL_TABLET | Freq: Once | ORAL | Status: DC

## 2015-07-30 MED ORDER — CIPROFLOXACIN HCL 250 MG PO TABS: 250.0000 mg | ORAL_TABLET | Freq: Two times a day (BID) | ORAL | Status: DC

## 2015-07-30 NOTE — Progress Notes (Signed)
S:  C/o uti sx for 2 days, burning, urgency, frequency, denies vaginal discharge, abdominal pain or flank pain:  Remainder ros neg  O:  Vitals wnl, nad, no cva tenderness, back nontender,, n/v intact ua 3+ leuks  A: uti  P: cipro 250mg  bid x 7d, increase water intake, add cranberry juice, return if not improving in 2 -3 days, return earlier if worsening, discussed pyelonephritis sx

## 2015-08-05 ENCOUNTER — Encounter: Payer: Self-pay | Admitting: Emergency Medicine

## 2015-08-05 ENCOUNTER — Ambulatory Visit
Admission: EM | Admit: 2015-08-05 | Discharge: 2015-08-05 | Disposition: A | Discharge status: Home / Self Care | Payer: 59 | Attending: Family Medicine | Admitting: Family Medicine

## 2015-08-05 DIAGNOSIS — A5901 Trichomonal vulvovaginitis: Secondary | ICD-10-CM | POA: Diagnosis not present

## 2015-08-05 LAB — WET PREP, GENITAL
Clue Cells Wet Prep HPF POC: NONE SEEN
Sperm: NONE SEEN
YEAST WET PREP: NONE SEEN

## 2015-08-05 LAB — URINALYSIS COMPLETE WITH MICROSCOPIC (ARMC ONLY)
BACTERIA UA: NONE SEEN
Bilirubin Urine: NEGATIVE
Glucose, UA: NEGATIVE mg/dL
Ketones, ur: NEGATIVE mg/dL
NITRITE: NEGATIVE
PH: 7 (ref 5.0–8.0)
Protein, ur: NEGATIVE mg/dL
SPECIFIC GRAVITY, URINE: 1.015 (ref 1.005–1.030)

## 2015-08-05 MED ORDER — METRONIDAZOLE 500 MG PO TABS: 2000.0000 mg | ORAL_TABLET | Freq: Once | ORAL | Status: DC

## 2015-08-05 NOTE — Discharge Instructions (Signed)
Rest. Drink plenty of fluids. Inform partners. All partner should be evaluated and treated. Practice pelvic rest, and no sexual activity until follow-up to ensure clearance.  Follow up with your primary care physician this week. Return to Urgent care for new or worsening concerns.    Trichomoniasis Trichomoniasis is an infection caused by an organism called Trichomonas. The infection can affect both women and men. In women, the outer female genitalia and the vagina are affected. In men, the penis is mainly affected, but the prostate and other reproductive organs can also be involved. Trichomoniasis is a sexually transmitted infection (STI) and is most often passed to another person through sexual contact.  RISK FACTORS  Having unprotected sexual intercourse.  Having sexual intercourse with an infected partner. SIGNS AND SYMPTOMS  Symptoms of trichomoniasis in women include:  Abnormal gray-green frothy vaginal discharge.  Itching and irritation of the vagina.  Itching and irritation of the area outside the vagina. Symptoms of trichomoniasis in men include:   Penile discharge with or without pain.  Pain during urination. This results from inflammation of the urethra. DIAGNOSIS  Trichomoniasis may be found during a Pap test or physical exam. Your health care provider may use one of the following methods to help diagnose this infection:  Testing the pH of the vagina with a test tape.  Using a vaginal swab test that checks for the Trichomonas organism. A test is available that provides results within a few minutes.  Examining a urine sample.  Testing vaginal secretions. Your health care provider may test you for other STIs, including HIV. TREATMENT   You may be given medicine to fight the infection. Women should inform their health care provider if they could be or are pregnant. Some medicines used to treat the infection should not be taken during pregnancy.  Your health care  provider may recommend over-the-counter medicines or creams to decrease itching or irritation.  Your sexual partner will need to be treated if infected.  Your health care provider may test you for infection again 3 months after treatment. HOME CARE INSTRUCTIONS   Take medicines only as directed by your health care provider.  Take over-the-counter medicine for itching or irritation as directed by your health care provider.  Do not have sexual intercourse while you have the infection.  Women should not douche or wear tampons while they have the infection.  Discuss your infection with your partner. Your partner may have gotten the infection from you, or you may have gotten it from your partner.  Have your sex partner get examined and treated if necessary.  Practice safe, informed, and protected sex.  See your health care provider for other STI testing. SEEK MEDICAL CARE IF:   You still have symptoms after you finish your medicine.  You develop abdominal pain.  You have pain when you urinate.  You have bleeding after sexual intercourse.  You develop a rash.  Your medicine makes you sick or makes you throw up (vomit). MAKE SURE YOU:  Understand these instructions.  Will watch your condition.  Will get help right away if you are not doing well or get worse.   This information is not intended to replace advice given to you by your health care provider. Make sure you discuss any questions you have with your health care provider.   Document Released: 09/15/2000 Document Revised: 04/12/2014 Document Reviewed: 01/01/2013 Elsevier Interactive Patient Education 2016 [employer].  Sexually Transmitted Disease A sexually transmitted disease (STD) is a disease  or infection often passed to another person during sex. However, STDs can be passed through nonsexual ways. An STD can be passed through:  Spit (saliva).  Semen.  Blood.  Mucus from the vagina.  Pee (urine). HOW  CAN I LESSEN MY CHANCES OF GETTING AN STD?  Use:  Latex condoms.  Water-soluble lubricants with condoms. Do not use petroleum jelly or oils.  Dental dams. These are small pieces of latex that are used as a barrier during oral sex.  Avoid having more than one sex partner.  Do not have sex with someone who has other sex partners.  Do not have sex with anyone you do not know or who is at high risk for an STD.  Avoid risky sex that can break your skin.  Do not have sex if you have open sores on your mouth or skin.  Avoid drinking too much alcohol or taking illegal drugs. Alcohol and drugs can affect your good judgment.  Avoid oral and anal sex acts.  Get shots (vaccines) for HPV and hepatitis.  If you are at risk of being infected with HIV, it is advised that you take a certain medicine daily to prevent HIV infection. This is called pre-exposure prophylaxis (PrEP). You may be at risk if:  You are a man who has sex with other men (MSM).  You are attracted to the opposite sex (heterosexual) and are having sex with more than one partner.  You take drugs with a needle.  You have sex with someone who has HIV.  Talk with your doctor about if you are at high risk of being infected with HIV. If you begin to take PrEP, get tested for HIV first. Get tested every 3 months for as long as you are taking PrEP.  Get tested for STDs every year if you are sexually active. If you are treated for an STD, get tested again 3 months after you are treated. WHAT SHOULD I DO IF I THINK I HAVE AN STD?  See your doctor.  Tell your sex partner(s) that you have an STD. They should be tested and treated.  Do not have sex until your doctor says it is okay. WHEN SHOULD I GET HELP? Get help right away if:  You have bad belly (abdominal) pain.  You are a man and have puffiness (swelling) or pain in your testicles.  You are a woman and have puffiness in your vagina.   This information is not intended  to replace advice given to you by your health care provider. Make sure you discuss any questions you have with your health care provider.   Document Released: 04/29/2004 Document Revised: 04/12/2014 Document Reviewed: 09/15/2012 Elsevier Interactive Patient Education [employer].

## 2015-08-05 NOTE — ED Notes (Signed)
Patient states she has recently been treated with CIPRO for a UTI and has developed a yeast infection

## 2015-08-05 NOTE — ED Provider Notes (Signed)
[facility]  ____________________________________________  Time seen: Approximately 7:00 PM  I have reviewed the triage vital signs and the nursing notes.   HISTORY  Chief Complaint Vaginitis   HPI Mackenzie Clark is a 50 y.o. female  presents with a complaint of vaginal discharge and vaginal itching. Patient reports this has been present for the last 2 weeks intermittently but more persistent over the last 4 days. Patient reports that she is currently finishing a prescription of Cipro for a recent urinary tract infection. Patient states that she has one day left of the Cipro out of 7 day course. Patient reports she does still have some mild urinary frequency but denies urgency or burning with urination. Patient reports that she does have inner and outer vaginal itching with intermittent vaginal discharge. Patient states that she has had a yeast infection in the past and was concerned that she may have had a yeast infection at this time. Patient states that her doctor did give her a prescription for Diflucan but states has not taken the Diflucan as she wanted to make sure that this was a yeast infection.  Patient reports that she did have a new sexual partner 2 months ago. Patient states that she has not had sexual intercourse since 2 months ago. Denies other sexual partner changes.  Denies rash. Denies abdominal pain, back pain, fevers, chest pain, shortness of breath, vaginal bleeding or other abnormal bleeding. Reports continues to move bowels well. Last bowel movement yesterday.  Patient's last menstrual period was 07/21/2015.Denies chance of pregnancy.    Past Medical History  Diagnosis Date  . Allergy   . Hypertension   . Diabetes mellitus without complication ([place])     diet and exercise controlled    Patient Active Problem List   Diagnosis Date Noted  . Hypertension 03/05/2013  . Pre-diabetes 03/05/2013  . Surveillance of previously prescribed contraceptive  pill 03/05/2013    Past Surgical History  Procedure Laterality Date  . Dilation and curettage of uterus  2006    16 week stillbirth    Current Outpatient Rx  Name  Route  Sig  Dispense  Refill  .           . fluticasone (FLONASE) 50 MCG/ACT nasal spray            5   . Liraglutide (VICTOZA) 18 MG/3ML SOPN   Subcutaneous   Inject 1.8 mg into the skin daily.         Marland Kitchen lisinopril-hydrochlorothiazide (PRINZIDE,ZESTORETIC) 20-12.5 MG per tablet   Oral   Take 1 tablet by mouth daily.         Marland Kitchen lisinopril-hydrochlorothiazide (PRINZIDE,ZESTORETIC) 20-25 MG per tablet   Oral   Take 1 tablet by mouth daily.         . metFORMIN (GLUCOPHAGE) 500 MG tablet   Oral   Take 500 mg by mouth daily with breakfast.         . norgestimate-ethinyl estradiol (ORTHO-CYCLEN,SPRINTEC,PREVIFEM) 0.25-35 MG-MCG tablet   Oral   Take 1 tablet by mouth daily.   1 Package   3   . fluconazole (DIFLUCAN) 150 MG tablet   Oral   Take 1 tablet (150 mg total) by mouth once.   1 tablet   0     Allergies Review of patient's allergies indicates no known allergies.  Family History  Problem Relation Age of Onset  . Hypertension Mother   . Hyperlipidemia Mother   . Hypertension Father   .  Diabetes Father   . Heart disease Father     heart attack age 16  . Hypertension Brother   . Cancer Maternal Grandmother 60    Breast  . Obesity Other     Social History Social History  Substance Use Topics  . Smoking status: Never Smoker   . Smokeless tobacco: Never Used  . Alcohol Use: Yes     Comment: occasionally    Review of Systems Constitutional: No fever/chills Eyes: No visual changes. ENT: No sore throat. Cardiovascular: Denies chest pain. Respiratory: Denies shortness of breath. Gastrointestinal: No abdominal pain.  No nausea, no vomiting.  No diarrhea.  No constipation. Genitourinary:As above. Musculoskeletal: Negative for back pain. Skin: Negative for rash. Neurological:  Negative for headaches, focal weakness or numbness.  10-point ROS otherwise negative.  ____________________________________________   PHYSICAL EXAM:  VITAL SIGNS: ED Triage Vitals  Enc Vitals Group     BP 08/05/15 1826 140/90 mmHg     Pulse Rate 08/05/15 1826 85     Resp 08/05/15 1826 18     Temp 08/05/15 1826 98.8 F (37.1 C)     Temp Source 08/05/15 1826 Tympanic     SpO2 08/05/15 1826 100 %     Weight 08/05/15 1826 213 lb (96.616 kg)     Height 08/05/15 1826 5\' 7"  (1.702 m)     Head Cir --      Peak Flow --      Pain Score --      Pain Loc --      Pain Edu? --      Excl. in [place]? --     Constitutional: Alert and oriented. Well appearing and in no acute distress. Eyes: Conjunctivae are normal. PERRL. EOMI. Head: Atraumatic.  Mouth/Throat: Mucous membranes are moist.  Oropharynx non-erythematous. No exudate. Neck: No stridor.  No cervical spine tenderness to palpation. Hematological/Lymphatic/Immunilogical: No cervical lymphadenopathy. Cardiovascular: Normal rate, regular rhythm. Grossly normal heart sounds.  Good peripheral circulation. Respiratory: Normal respiratory effort.  No retractions. Lungs CTAB. No wheezes, rales or rhonchi. Gastrointestinal: Soft and nontender. No distention. Normal Bowel sounds.   No CVA tenderness. Pelvic: Completed with Izora Gala RN at bedside as chaperone. External: No rash or lesion with normal appearance. Speculum: Moderate amount of yellowish to whitish thin active discharge, no active bleeding or dried blood in vaginal vault, cervical os closed, no foreign bodies. Bimanual: Nontender, no cervical or adnexal tenderness. Musculoskeletal: No lower or upper extremity tenderness nor edema.  No joint effusions. Bilateral pedal pulses equal and easily palpated.  Neurologic:  Normal speech and language. No gross focal neurologic deficits are appreciated. No gait instability. Skin:  Skin is warm, dry and intact. No rash noted. Psychiatric: Mood and  affect are normal. Speech and behavior are normal.  ____________________________________________   LABS (all labs ordered are listed, but only abnormal results are displayed)  Labs Reviewed  WET PREP, GENITAL - Abnormal; Notable for the following:    Trich, Wet Prep PRESENT (*)    WBC, Wet Prep HPF POC TOO NUMEROUS TO COUNT (*)    All other components within normal limits  URINALYSIS COMPLETEWITH MICROSCOPIC ([facility] ONLY) - Abnormal; Notable for the following:    Hgb urine dipstick TRACE (*)    Leukocytes, UA TRACE (*)    Squamous Epithelial / LPF 0-5 (*)    All other components within normal limits  CHLAMYDIA/NGC RT PCR ([facility] ONLY)     INITIAL IMPRESSION / ASSESSMENT AND PLAN / ED COURSE  Pertinent labs & imaging results that were available during my care of the patient were reviewed by me and considered in my medical decision making (see chart for details).  Very well-appearing patient. No acute distress. Presents for complaints of vaginal discharge and vaginal itching that has been intermittent over the last 2 weeks more persistent over the last 4 days. Patient reports she is concerned that she has a yeast infection. Does report recent change in sexual partner with last intercourse being 2 months ago and states not highly concerning regarding STD but had stated she wanted to make sure not an STD. Pelvic exam completed. Wet prep positive for Trichomonas as well as too numerous to count WBCs. Urinalysis negative for bacteria, positive trace leukocytes and positive trace hemoglobin, will culture urine.  Discussed in detail results with patient. Will treat patient with 2 g oral Flagyl once. Discussed with patient need for pelvic rest as well as all partners to be informed, tested and evaluated and treated. Discussed no sexual activity until follow-up to ensure clearance. Encouraged further STD testing and follow-up. Encouraged PCP and or Health Department follow up.   Discussed  follow up with Primary care physician this week. Discussed follow up and return parameters including no resolution or any worsening concerns. Patient verbalized understanding and agreed to plan.   ____________________________________________   FINAL CLINICAL IMPRESSION(S) / ED DIAGNOSES  Final diagnoses:  Trichomonal vaginitis      Note: This dictation was prepared with Dragon dictation along with smaller phrase technology. Any transcriptional errors that result from this process are unintentional.    Marylene Land, NP 08/05/15 2052

## 2015-08-06 LAB — CHLAMYDIA/NGC RT PCR (ARMC ONLY)
CHLAMYDIA TR: NOT DETECTED
N gonorrhoeae: NOT DETECTED

## 2015-08-07 LAB — URINE CULTURE
Culture: NO GROWTH
SPECIAL REQUESTS: NORMAL

## 2015-09-12 ENCOUNTER — Other Ambulatory Visit: Payer: Self-pay

## 2015-09-12 NOTE — Patient Outreach (Signed)
[place] [facility]) Care Management  09/12/2015  Mackenzie Clark Jul 26, 1965 [id number]   Phone call to schedule appointment.  No answer and message left.  Plan to send appointment request letter. Member has not had Link to Wellness appt since 03/05/15. [name] RN, [facility] Care Management Coordinator-Link to [place] Management [phone]

## 2015-10-29 ENCOUNTER — Ambulatory Visit: Payer: Self-pay

## 2015-12-03 ENCOUNTER — Other Ambulatory Visit: Payer: Self-pay

## 2015-12-03 VITALS — BP 122/80 | HR 77 | Resp 14 | Ht 67.0 in | Wt 210.3 lb

## 2015-12-03 DIAGNOSIS — R7303 Prediabetes: Secondary | ICD-10-CM

## 2015-12-03 NOTE — Patient Outreach (Signed)
[place] [facility]) Care Management   12/03/2015  Mackenzie Clark January 10, 1966 [id number]  Mackenzie Clark is an 50 y.o. female.   Member seen for follow up office visit for Link to Wellness program for self management of  prediabetes  Subjective:  Member states that her hemoglobin A1C went up to 6.6% when she saw her provider in April.  States that she put her back on Victoza.  States she has not been exercising as much.  States she is trying to watch the [facility] that she eats.  Objective:   Review of Systems  All other systems reviewed and are negative.   Physical Exam Today's Vitals   12/03/15 1609 12/03/15 1612  BP: 122/80   Pulse: 77   Resp: 14   SpO2: 98%   Weight: 210 lb 4.8 oz (95.4 kg)   Height: 1.702 m (5\' 7" )   PainSc: 0-No pain 0-No pain   Encounter Medications:   Outpatient Encounter Prescriptions as of 12/03/2015  Medication Sig Note  . fluticasone (FLONASE) 50 MCG/ACT nasal spray  07/30/2015: Received from: External Pharmacy  . Liraglutide (VICTOZA) 18 MG/3ML SOPN Inject 1.8 mg into the skin daily.   Marland Kitchen lisinopril-hydrochlorothiazide (PRINZIDE,ZESTORETIC) 20-25 MG per tablet Take 1 tablet by mouth daily.   . metFORMIN (GLUCOPHAGE) 500 MG tablet Take 500 mg by mouth daily with breakfast.   . norgestimate-ethinyl estradiol (ORTHO-CYCLEN,SPRINTEC,PREVIFEM) 0.25-35 MG-MCG tablet Take 1 tablet by mouth daily.   . ciprofloxacin (CIPRO) 250 MG tablet Take 1 tablet (250 mg total) by mouth 2 (two) times daily. (Patient not taking: Reported on 12/03/2015)   . fluconazole (DIFLUCAN) 150 MG tablet Take 1 tablet (150 mg total) by mouth once. (Patient not taking: Reported on 12/03/2015)   . lisinopril-hydrochlorothiazide (PRINZIDE,ZESTORETIC) 20-12.5 MG per tablet Take 1 tablet by mouth daily. 07/05/2014: Taking 20-25 mg dose  . metroNIDAZOLE (FLAGYL) 500 MG tablet Take 4 tablets (2,000 mg total) by mouth once. (Patient not taking: Reported on 12/03/2015)    No  facility-administered encounter medications on file as of 12/03/2015.     Functional Status:   In your present state of health, do you have any difficulty performing the following activities: 12/03/2015 03/05/2015  Hearing? N N  Vision? N N  Difficulty concentrating or making decisions? N N  Walking or climbing stairs? N N  Dressing or bathing? N N  Doing errands, shopping? N N  Some recent data might be hidden    Fall/Depression Screening:    PHQ 2/9 Scores 12/03/2015 03/05/2015 07/05/2014 07/14/2013  PHQ - 2 Score 0 0 0 0    Assessment:  Member seen for follow up office visit for Link to Wellness program for self management of  Prediabetes.  Member slightly above goal with last hemoglobin A1C of 6.6%.  Reports following a low CHO diet and but she has been exercising less.  Member now back on Victoza.  She does not check CBGs.  She is up to date with annual eye exams.  Plan:  1. Plan to eat 30-45 gm (2-3 servings) carbohydrates a meal and 15 gm for snacks.  Plan to continue use Lose It to monitor carbohydrates and calories 2. Plan to exercise/walk 4-5 times a week for 15 minutes at lunch break and Wii game when weather does not allow walking.  Goal of 150 minutes a week 3. Plan to work on losing 10% of your weight by exercising more and returning to watching calories.  Plan to consider joining [employer]  4. Plan to see provider 01/01/16. 5. Plan to return to Link to Wellness on 03/17/16 at [place] Problem One   Flowsheet Row Most Recent Value  Care Plan Problem One  Potential for elevated blood sugars related to dx of diabetes  Role Documenting the Problem One  Care Management Coordinator  Care Plan for Problem One  Active  [facility] Long Term Goal (31-90 days)  Plan to maintain hemoglobin A1C at or below 6.5  [facility] Long Term Goal Start Date  12/03/15 [hemoglobin A1C 5.9 on 10/31/14]  Interventions for Problem One Long Term Goal  Reviewed carbohydrate counting and portion  control,Discussed importance of weight loss for blood sugars, Encouraged to join [employer] and given information on how to join, Reinforced importance of exercise in lowering blood sugars, Encouraged to walk at her lunch breaks even for 10-15 minutes,  Encouraged to keep appt with prirmary care MD  in September     Atlanta Pelto RN, [facility] Care Management Coordinator-Link to [place] Management [phone]

## 2015-12-03 NOTE — Patient Instructions (Signed)
1. Plan to eat 30-45 gm (2-3 servings) carbohydrates a meal and 15 gm for snacks.  Plan to continue use Lose It to monitor carbohydrates and calories 2. Plan to exercise/walk 4-5 times a week for 15 minutes at lunch break and Wii game when weather does not allow walking.  Goal of 150 minutes a week 3. Plan to work on losing 10% of your weight by exercising more and returning to watching calories.  Plan to consider joining [employer] 4. Plan to see provider 01/01/16. 5. Plan to return to [facility] to Wellness on 03/17/16 at [facility]

## 2016-01-01 DIAGNOSIS — E1165 Type 2 diabetes mellitus with hyperglycemia: Secondary | ICD-10-CM | POA: Diagnosis not present

## 2016-01-01 DIAGNOSIS — Z79899 Other long term (current) drug therapy: Secondary | ICD-10-CM | POA: Diagnosis not present

## 2016-01-01 DIAGNOSIS — Z23 Encounter for immunization: Secondary | ICD-10-CM | POA: Diagnosis not present

## 2016-01-01 DIAGNOSIS — Z6834 Body mass index (BMI) 34.0-34.9, adult: Secondary | ICD-10-CM | POA: Diagnosis not present

## 2016-01-01 DIAGNOSIS — I10 Essential (primary) hypertension: Secondary | ICD-10-CM | POA: Diagnosis not present

## 2016-01-02 DIAGNOSIS — Z1231 Encounter for screening mammogram for malignant neoplasm of breast: Secondary | ICD-10-CM | POA: Diagnosis not present

## 2016-03-17 ENCOUNTER — Ambulatory Visit: Payer: Self-pay

## 2016-06-17 DIAGNOSIS — E119 Type 2 diabetes mellitus without complications: Secondary | ICD-10-CM | POA: Diagnosis not present

## 2016-06-17 DIAGNOSIS — H52223 Regular astigmatism, bilateral: Secondary | ICD-10-CM | POA: Diagnosis not present

## 2016-06-17 DIAGNOSIS — H5213 Myopia, bilateral: Secondary | ICD-10-CM | POA: Diagnosis not present

## 2016-07-14 ENCOUNTER — Encounter: Payer: Self-pay | Admitting: Obstetrics & Gynecology

## 2016-07-14 ENCOUNTER — Ambulatory Visit (INDEPENDENT_AMBULATORY_CARE_PROVIDER_SITE_OTHER): Payer: 59 | Admitting: Obstetrics & Gynecology

## 2016-07-14 VITALS — BP 128/86 | HR 80 | Ht 67.0 in | Wt 212.0 lb

## 2016-07-14 DIAGNOSIS — Z01419 Encounter for gynecological examination (general) (routine) without abnormal findings: Secondary | ICD-10-CM

## 2016-07-14 DIAGNOSIS — Z3041 Encounter for surveillance of contraceptive pills: Secondary | ICD-10-CM

## 2016-07-14 MED ORDER — NORETHINDRONE 0.35 MG PO TABS: 1.0000 | ORAL_TABLET | Freq: Every day | ORAL | 7 refills | Status: DC

## 2016-07-14 NOTE — Progress Notes (Signed)
Pt here for routine GYN exam. She c/o irregular cycles

## 2016-07-14 NOTE — Progress Notes (Signed)
GYNECOLOGY ANNUAL PREVENTATIVE CARE ENCOUNTER NOTE  Subjective:   Mackenzie Clark is a 51 y.o. [phone] female here for a routine annual gynecologic exam.  Current complaints: started to skip periods in the last 3-4 months. She is on Micronor for birth control and cycle control. She wonders is she is perimenopausal, no vasomotor symptoms.   Denies abnormal vaginal bleeding, discharge, pelvic pain, problems with intercourse or other gynecologic concerns.    Gynecologic History Patient's last menstrual period was 06/19/2016. Contraception: oral progesterone-only contraceptive Last Pap: 03/2013. Results were: normal Last mammogram: 11/2015 at [facility] Results were: normal  Obstetric History OB History  Gravida Para Term Preterm AB Living  3 2 2  0 1 2  SAB TAB Ectopic Multiple Live Births  1 0 0 0 2    # Outcome Date GA Lbr Len/2nd Weight Sex Delivery Anes PTL Lv  3 SAB  [redacted]w[redacted]d         2 Term      Vag-Spont   LIV  1 Term      Vag-Spont   LIV      Past Medical History:  Diagnosis Date  . Allergy   . Diabetes mellitus without complication (HCC)    diet and exercise controlled  . Hypertension     Past Surgical History:  Procedure Laterality Date  . DILATION AND CURETTAGE OF UTERUS  2006   16 week stillbirth    Current Outpatient Prescriptions on File Prior to Visit  Medication Sig Dispense Refill  . fluticasone (FLONASE) 50 MCG/ACT nasal spray   5  . Liraglutide (VICTOZA) 18 MG/3ML SOPN Inject 1.8 mg into the skin daily.    Marland Kitchen lisinopril-hydrochlorothiazide (PRINZIDE,ZESTORETIC) 20-25 MG per tablet Take 1 tablet by mouth daily.    . metFORMIN (GLUCOPHAGE) 500 MG tablet Take 500 mg by mouth daily with breakfast.     No current facility-administered medications on file prior to visit.     No Known Allergies  Social History   Social History  . Marital status: Married    Spouse name: N/A  . Number of children: N/A  . Years of education: N/A    Occupational History  . [employer]   Social History Main Topics  . Smoking status: Never Smoker  . Smokeless tobacco: Never Used  . Alcohol use Yes     Comment: occasionally  . Drug use: No  . Sexual activity: Yes    Partners: Male    Birth control/ protection: OCP, Pill   Other Topics Concern  . Not on file   Social History Narrative  . No narrative on file    Family History  Problem Relation Age of Onset  . Hypertension Mother   . Hyperlipidemia Mother   . Hypertension Father   . Diabetes Father   . Heart disease Father     heart attack age 43  . Hypertension Brother   . Cancer Maternal Grandmother 79    Breast  . Obesity Other     The following portions of the patient's history were reviewed and updated as appropriate: allergies, current medications, past family history, past medical history, past social history, past surgical history and problem list.  Review of Systems Pertinent items noted in HPI and remainder of comprehensive ROS otherwise negative.   Objective:  BP 128/86   Pulse 80   Ht 5\' 7"  (1.702 m)   Wt 212 lb (96.2 kg)   LMP 06/19/2016  BMI 33.20 kg/m  CONSTITUTIONAL: Well-developed, well-nourished female in no acute distress.  HENT:  Normocephalic, atraumatic, External right and left ear normal. Oropharynx is clear and moist EYES: Conjunctivae and EOM are normal. Pupils are equal, round, and reactive to light. No scleral icterus.  NECK: Normal range of motion, supple, no masses.  Normal thyroid.  SKIN: Skin is warm and dry. No rash noted. Not diaphoretic. No erythema. No pallor. NEUROLOGIC: Alert and oriented to person, place, and time. Normal reflexes, muscle tone coordination. No cranial nerve deficit noted. PSYCHIATRIC: Normal mood and affect. Normal behavior. Normal judgment and thought content. CARDIOVASCULAR: Normal heart rate noted, regular rhythm RESPIRATORY: Clear to auscultation bilaterally. Effort and  breath sounds normal, no problems with respiration noted. BREASTS: Symmetric in size. No masses, skin changes, nipple drainage, or lymphadenopathy. ABDOMEN: Soft, normal bowel sounds, no distention noted.  No tenderness, rebound or guarding.  PELVIC: Normal appearing external genitalia; normal appearing vaginal mucosa and cervix.  No abnormal discharge noted.  Pap smear obtained.  Normal uterine size with right sided small fibroid palpated, no other palpable masses, no uterine or adnexal tenderness. MUSCULOSKELETAL: Normal range of motion. No tenderness.  No cyanosis, clubbing, or edema.  2+ distal pulses.   Assessment:  Annual gynecologic examination with pap smear   Plan:  Will follow up results of pap smear and manage accordingly. Mammogram scheduled at [facility]; she gets this yearly at this site Reassured about skipped periods; Micronor refilled. Told to call for more frequent or heavier bleeding. No current intervention needed. Routine preventative health maintenance measures emphasized. Please refer to After Visit Summary for other counseling recommendations.    Verita Schneiders, MD, [place] Attending Obstetrician & Gynecologist, [place] for [facility]

## 2016-07-14 NOTE — Patient Instructions (Signed)

## 2016-07-16 LAB — CYTOLOGY - PAP
CHLAMYDIA, DNA PROBE: NEGATIVE
Diagnosis: NEGATIVE
HPV (WINDOPATH): NOT DETECTED
Neisseria Gonorrhea: NEGATIVE
Trichomonas: NEGATIVE

## 2016-07-22 DIAGNOSIS — Z6834 Body mass index (BMI) 34.0-34.9, adult: Secondary | ICD-10-CM | POA: Diagnosis not present

## 2016-07-22 DIAGNOSIS — E119 Type 2 diabetes mellitus without complications: Secondary | ICD-10-CM | POA: Diagnosis not present

## 2016-07-22 DIAGNOSIS — I10 Essential (primary) hypertension: Secondary | ICD-10-CM | POA: Diagnosis not present

## 2016-12-10 DIAGNOSIS — E119 Type 2 diabetes mellitus without complications: Secondary | ICD-10-CM | POA: Diagnosis not present

## 2016-12-10 DIAGNOSIS — Z6832 Body mass index (BMI) 32.0-32.9, adult: Secondary | ICD-10-CM | POA: Diagnosis not present

## 2016-12-10 DIAGNOSIS — I10 Essential (primary) hypertension: Secondary | ICD-10-CM | POA: Diagnosis not present

## 2016-12-10 DIAGNOSIS — E669 Obesity, unspecified: Secondary | ICD-10-CM | POA: Diagnosis not present

## 2016-12-29 DIAGNOSIS — E119 Type 2 diabetes mellitus without complications: Secondary | ICD-10-CM | POA: Diagnosis not present

## 2017-01-13 DIAGNOSIS — Z1231 Encounter for screening mammogram for malignant neoplasm of breast: Secondary | ICD-10-CM | POA: Diagnosis not present

## 2017-02-02 ENCOUNTER — Encounter: Payer: Self-pay | Admitting: Obstetrics & Gynecology

## 2017-03-01 ENCOUNTER — Other Ambulatory Visit: Payer: Self-pay

## 2017-03-01 NOTE — Patient Outreach (Signed)
[facility]) Care Management  03/01/2017  Mackenzie Clark 1965-10-29 [id number]   Case closed in [place].  Member is being followed by the [employer] program Member enrolled in an external program. [name] RN, [facility] Care Management Coordinator-Link to [place] Management [phone]

## 2017-06-09 ENCOUNTER — Other Ambulatory Visit: Payer: Self-pay

## 2017-06-09 ENCOUNTER — Encounter (HOSPITAL_COMMUNITY): Payer: Self-pay | Admitting: Emergency Medicine

## 2017-06-09 ENCOUNTER — Ambulatory Visit (HOSPITAL_COMMUNITY)
Admission: EM | Admit: 2017-06-09 | Discharge: 2017-06-09 | Disposition: A | Discharge status: Home / Self Care | Payer: No Typology Code available for payment source | Attending: Family Medicine | Admitting: Family Medicine

## 2017-06-09 DIAGNOSIS — Z202 Contact with and (suspected) exposure to infections with a predominantly sexual mode of transmission: Secondary | ICD-10-CM | POA: Diagnosis not present

## 2017-06-09 DIAGNOSIS — R3915 Urgency of urination: Secondary | ICD-10-CM

## 2017-06-09 DIAGNOSIS — Z113 Encounter for screening for infections with a predominantly sexual mode of transmission: Secondary | ICD-10-CM

## 2017-06-09 LAB — POCT URINALYSIS DIP (DEVICE)
Bilirubin Urine: NEGATIVE
Glucose, UA: NEGATIVE mg/dL
Ketones, ur: NEGATIVE mg/dL
LEUKOCYTES UA: NEGATIVE
Nitrite: NEGATIVE
Protein, ur: NEGATIVE mg/dL
SPECIFIC GRAVITY, URINE: 1.015 (ref 1.005–1.030)
UROBILINOGEN UA: 0.2 mg/dL (ref 0.0–1.0)
pH: 7.5 (ref 5.0–8.0)

## 2017-06-09 MED ORDER — TOLTERODINE TARTRATE ER 2 MG PO CP24: 2.0000 mg | ORAL_CAPSULE | Freq: Every day | ORAL | 0 refills | Status: DC

## 2017-06-09 NOTE — ED Notes (Signed)
Pt also stating she has a vaginal odor and would like to be swabbed.

## 2017-06-09 NOTE — ED Triage Notes (Signed)
Pt states "I think I have a UTI". Pt denies any pain, denies pelvic pain, or painful urination. Pt endorses frequency of urination, states "I have to go every hour and its a full bladder each time". Pt denies feeling thirsty, denies drinking more than usual amount of water.

## 2017-06-09 NOTE — Discharge Instructions (Signed)
We have sent testing for sexually transmitted infections. We will notify you of any positive results once they are received. If required, we will prescribe any medications you might need. °

## 2017-06-09 NOTE — ED Notes (Signed)
Patient self swabbed for the cytology swab

## 2017-06-11 LAB — CERVICOVAGINAL ANCILLARY ONLY
Bacterial vaginitis: POSITIVE — AB
CHLAMYDIA, DNA PROBE: NEGATIVE
Candida vaginitis: NEGATIVE
Neisseria Gonorrhea: NEGATIVE
TRICH (WINDOWPATH): NEGATIVE

## 2017-06-13 NOTE — ED Provider Notes (Signed)
[name]    ASSESSMENT & PLAN:  1. Urinary urgency   2. Screening for STDs (sexually transmitted diseases)     Meds ordered this encounter  Medications  . tolterodine (DETROL LA) 2 MG 24 hr capsule    Sig: Take 1 capsule (2 mg total) by mouth daily.    Dispense:  30 capsule    Refill:  0   Question bladder spasms. Trial of Detrol. Low suspicion of infectious cause. Urine culture sent. Will notify patient when results available. Will follow up with her PCP or here if not showing improvement.  Outlined signs and symptoms indicating need for more acute intervention. Patient verbalized understanding. After Visit Summary given.  SUBJECTIVE:  Mackenzie Clark is a 52 y.o. female who complains of urinary urgency. Has noticed gradually over the past few weeks to months. "Just feel like I have to go and can barely make it to the toilet." Occasional leakage of urine secondary. No flank pain, fever, chills, abnormal vaginal discharge or bleeding. Hematuria: not present. Normal PO intake. No abdominal pain. No self treatment. Ambulatory without difficulty. Requests STD screening.  LMP: Patient's last menstrual period was 06/08/2017.  ROS: As in HPI.  OBJECTIVE:  Vitals:   06/09/17 1912  BP: (!) 142/96  Pulse: 77  Resp: 16  Temp: 98.4 F (36.9 C)  SpO2: 100%   Appears well, in no apparent distress. Abdomen is soft without tenderness, guarding, mass, rebound or organomegaly. No CVA tenderness or inguinal adenopathy noted.  Labs Reviewed  POCT URINALYSIS DIP (DEVICE) - Abnormal; Notable for the following components:      Result Value   Hgb urine dipstick TRACE (*)    All other components within normal limits  CERVICOVAGINAL ANCILLARY ONLY - Abnormal; Notable for the following components:   Bacterial vaginitis **POSITIVE for Gardnerella vaginalis** (*)    All other components within normal limits    No Known Allergies  Past Medical History:  Diagnosis Date    . Allergy   . Diabetes mellitus without complication (HCC)    diet and exercise controlled  . Hypertension    Social History   Socioeconomic History  . Marital status: Married    Spouse name: Not on file  . Number of children: Not on file  . Years of education: Not on file  . Highest education level: Not on file  Social Needs  . Financial resource strain: Not on file  . Food insecurity - worry: Not on file  . Food insecurity - inability: Not on file  . Transportation needs - medical: Not on file  . Transportation needs - non-medical: Not on file  Occupational History  . Occupation: CMA    Employer: [place]: [employer]  Tobacco Use  . Smoking status: Never Smoker  . Smokeless tobacco: Never Used  Substance and Sexual Activity  . Alcohol use: Yes    Comment: occasionally  . Drug use: No  . Sexual activity: Yes    Partners: Male    Birth control/protection: OCP, Pill  Other Topics Concern  . Not on file  Social History Narrative  . Not on file   Family History  Problem Relation Age of Onset  . Hypertension Mother   . Hyperlipidemia Mother   . Hypertension Father   . Diabetes Father   . Heart disease Father        heart attack age 51  . Hypertension Brother   . Cancer Maternal Grandmother  79       Breast  . Obesity Other        Vanessa Kick, MD 06/13/17 [phone]

## 2017-06-15 ENCOUNTER — Telehealth (HOSPITAL_COMMUNITY): Payer: Self-pay | Admitting: Emergency Medicine

## 2017-06-15 MED ORDER — METRONIDAZOLE 500 MG PO TABS: 500.0000 mg | ORAL_TABLET | Freq: Two times a day (BID) | ORAL | 0 refills | Status: DC

## 2017-06-15 MED FILL — metroNIDAZOLE 500 MG TABS: 500 | 7 days supply | Qty: 14 | Fill #0

## 2017-06-15 NOTE — Telephone Encounter (Signed)
Pt came in to get results.... Pt ID'd properly Reports feeling better but still having intermittent vag irritation/d/c Requests Flagyl to be sent to [email or web address]  Adv pt if sx are not getting better to return or to f/u w/PCP Education on safe sex given Notified pt that lab results can be obtained through MyChart Pt verb understanding.

## 2017-06-15 NOTE — Telephone Encounter (Signed)
-----   Message from Wynona Luna, MD sent at 06/12/2017  9:51 AM EDT ----- Clinical staff, please let patient know that test for gardnerella (bacterial vaginosis) was positive.  This only needs to be treated if there are persistent symptoms, such as vaginal irritation/discharge.   If these symptoms are present, ok to send rx for metronidazole 500mg  bid x 7d #14 no refills or metronidazole vaginal gel 7.49% 1 applicatorful bid x 7d #35 no refills.  Recheck or followup with PCP for further evaluation if symptoms are not improving.  LM

## 2017-08-26 ENCOUNTER — Ambulatory Visit (INDEPENDENT_AMBULATORY_CARE_PROVIDER_SITE_OTHER): Payer: No Typology Code available for payment source | Admitting: Obstetrics and Gynecology

## 2017-08-26 ENCOUNTER — Encounter: Payer: Self-pay | Admitting: Obstetrics and Gynecology

## 2017-08-26 ENCOUNTER — Other Ambulatory Visit (HOSPITAL_COMMUNITY)
Admission: RE | Admit: 2017-08-26 | Discharge: 2017-08-26 | Disposition: A | Discharge status: Home / Self Care | Payer: No Typology Code available for payment source | Source: Ambulatory Visit | Attending: Obstetrics and Gynecology | Admitting: Obstetrics and Gynecology

## 2017-08-26 VITALS — BP 130/85 | HR 71 | Resp 16 | Ht 67.0 in | Wt 202.0 lb

## 2017-08-26 DIAGNOSIS — N939 Abnormal uterine and vaginal bleeding, unspecified: Secondary | ICD-10-CM | POA: Diagnosis not present

## 2017-08-26 NOTE — Procedures (Signed)
Endometrial Biopsy Procedure Note  Pre-operative Diagnosis: AUB.  Post-operative Diagnosis: same. ?stenosis of the internal os  Procedure Details  Urine pregnancy test was done and result was negative.  The risks (including infection, bleeding, pain, and uterine perforation) and benefits of the procedure were explained to the patient and Written informed consent was obtained.  The patient was placed in the dorsal lithotomy position.  Bimanual exam showed the uterus to be in the neutral position.  A Graves' speculum inserted in the vagina, and the cervix was then prepped with povidone iodine, and a sharp tenaculum was applied to the anterior lip of the cervix for stabilization.  A pipelle was inserted into the uterine cavity and sounded the uterus to a depth of 5.5cm.  A Small amount of tissue was collected after 3 passes. The sample was sent for pathologic examination.  Condition: Stable  Complications: Concern for didn't get past internal os.   Plan: The patient was advised to call for any fever or for prolonged or severe pain or bleeding. She was advised to use OTC analgesics as needed for mild to moderate pain. She was advised to avoid vaginal intercourse for 48 hours or until the bleeding has completely stopped.  Follow up path and u/s.   Durene Romans MD Attending Center for [employer])

## 2017-08-26 NOTE — Progress Notes (Signed)
Obstetrics and Gynecology Established Patient Evaluation  Appointment Date: 08/26/2017  OBGYN Clinic: [facility] for [facility]  Primary Care Provider: Glendale Chard  Referring Provider: Glendale Chard, MD  Chief Complaint: AUB  History of Present Illness: Mackenzie Clark is a 52 y.o. African-American [id number] (Patient's last menstrual period was 08/15/2017 (exact date).), seen for the above chief complaint. Her past medical history is significant for HTN, DM2  Had normal periods that were qmonth, 5 days and not really heavy or painful, but in January and April she had a period about 2wks after her period and but it wasn't as heavy.  Patient has been on the micronor for about 5 years.   No hot flashes, night sweats, vaginal dryness, breast s/s  Review of Systems:  as noted in the History of Present Illness.   Past Medical History:  Past Medical History:  Diagnosis Date  . Allergy   . Diabetes mellitus without complication (HCC)    diet and exercise controlled  . Hypertension     Past Surgical History:  Past Surgical History:  Procedure Laterality Date  . DILATION AND CURETTAGE OF UTERUS  2006   16 week stillbirth    Past Obstetrical History:  OB History  Gravida Para Term Preterm AB Living  3 2 2  0 1 2  SAB TAB Ectopic Multiple Live Births  1 0 0 0 2    # Outcome Date GA Lbr Len/2nd Weight Sex Delivery Anes PTL Lv  3 SAB  [redacted]w[redacted]d         2 Term      Vag-Spont   LIV  1 Term      Vag-Spont   LIV    Past Gynecological History: As per HPI. History of Pap Smear(s): Yes.   Last pap 2018, which was NILM/HPV neg She is currently using minipill for contraception.   Social History:  Social History   Socioeconomic History  . Marital status: Married    Spouse name: Not on file  . Number of children: Not on file  . Years of education: Not on file  . Highest education level: Not on file  Occupational History  . 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 Financial resource strain: Not on file  . Food insecurity:    Worry: Not on file    Inability: Not on file  . Transportation needs:    Medical: Not on file    Non-medical: Not on file  Tobacco Use  . Smoking status: Never Smoker  . Smokeless tobacco: Never Used  Substance and Sexual Activity  . Alcohol use: Yes    Comment: occasionally  . Drug use: No  . Sexual activity: Yes    Partners: Male    Birth control/protection: OCP, Pill  Lifestyle  . Physical activity:    Days per week: Not on file    Minutes per session: Not on file  . Stress: Not on file  Relationships  . Social connections:    Talks on phone: Not on file    Gets together: Not on file    Attends religious service: Not on file    Active member of club or organization: Not on file    Attends meetings of clubs or organizations: Not on file    Relationship status: Not on file  . Intimate partner violence:    Fear of current or ex partner: Not on file    Emotionally abused:  Not on file    Physically abused: Not on file    Forced sexual activity: Not on file  Other Topics Concern  . Not on file  Social History Narrative  . Not on file    Family History:  Family History  Problem Relation Age of Onset  . Hypertension Mother   . Hyperlipidemia Mother   . Hypertension Father   . Diabetes Father   . Heart disease Father        heart attack age 80  . Hypertension Brother   . Cancer Maternal Grandmother 59       Breast  . Obesity Other     Medications Myrth K. Penton had no medications administered during this visit. Current Outpatient Medications  Medication Sig Dispense Refill  . fluticasone (FLONASE) 50 MCG/ACT nasal spray   5  . lisinopril-hydrochlorothiazide (PRINZIDE,ZESTORETIC) 20-25 MG per tablet Take 1 tablet by mouth daily.    . metFORMIN (GLUCOPHAGE) 500 MG tablet Take 500 mg by mouth daily with breakfast.    . norethindrone  (MICRONOR,CAMILA,ERRIN) 0.35 MG tablet Take 1 tablet (0.35 mg total) by mouth daily. 3 Package 7  . TRULICITY 8.24 MP/5.3IR SOPN Inject 0.75 mg as directed once a week.  1  . valACYclovir (VALTREX) 1000 MG tablet Take 1 tablet by mouth as needed.  0  . Vitamin D, Ergocalciferol, (DRISDOL) 50000 units CAPS capsule Take 1 capsule by mouth once a week.     No current facility-administered medications for this visit.     Allergies Patient has no known allergies.   Physical Exam:  BP 130/85 (BP Location: Right Arm, Patient Position: Sitting, Cuff Size: Large)   Pulse 71   Resp 16   Ht 5\' 7"  (1.702 m)   Wt 202 lb (91.6 kg)   LMP 08/15/2017 (Exact Date)   BMI 31.64 kg/m  Body mass index is 31.64 kg/m. General appearance: Well nourished, well developed female in no acute distress.  Cardiovascular: normal s1 and s2.  No murmurs, rubs or gallops. Respiratory:  Clear to auscultation bilateral. Normal respiratory effort Abdomen: positive bowel sounds and no masses, hernias; diffusely non tender to palpation, non distended Neuro/Psych:  Normal mood and affect.  Skin:  Warm and dry.  Lymphatic:  No inguinal lymphadenopathy.   Pelvic exam: is not limited by body habitus EGBUS: within normal limits, Vagina: within normal limits and with no blood or discharge in the vault, Cervix: normal appearing cervix without tenderness, discharge or lesions. ? Stenosis of the internal os. Uterus:  nonenlarged and non tender and Adnexa:  normal adnexa and no mass, fullness, tenderness Rectovaginal: deferred  See procedure note for embx  Laboratory: UPT neg  Radiology: none  Assessment: pt stable  Plan:  D/w her that there are many potential reasons namely chronic ocp use causing endometrial atrophy, being close to average age of menopause, need to r/o malignancy.  embx done but only went to 5.5cm and scant tissue so concern that didn't get past internal os. Will get early cycle u/s in 3wks to evaluate  for any structural issues and f/u embx. If not successful in terms of getting pathology tissue, can d/w her re: repeating it and can try cytotec before hand.  RTC will call pt and f/u after embx and u/s results are back  Durene Romans MD Attending Center for [facility])

## 2017-09-03 DIAGNOSIS — Z6830 Body mass index (BMI) 30.0-30.9, adult: Secondary | ICD-10-CM | POA: Insufficient documentation

## 2017-09-03 DIAGNOSIS — Z6833 Body mass index (BMI) 33.0-33.9, adult: Secondary | ICD-10-CM | POA: Insufficient documentation

## 2017-09-03 DIAGNOSIS — E669 Obesity, unspecified: Secondary | ICD-10-CM | POA: Insufficient documentation

## 2017-09-03 DIAGNOSIS — IMO0002 Reserved for concepts with insufficient information to code with codable children: Secondary | ICD-10-CM | POA: Insufficient documentation

## 2017-09-03 DIAGNOSIS — E1165 Type 2 diabetes mellitus with hyperglycemia: Secondary | ICD-10-CM | POA: Insufficient documentation

## 2017-09-03 LAB — BASIC METABOLIC PANEL
BUN: 7 (ref 4–21)
Creatinine: 0.8 (ref 0.5–1.1)
GLUCOSE: 144
POTASSIUM: 3.3 — AB (ref 3.4–5.3)
Sodium: 142 (ref 137–147)

## 2017-09-03 LAB — HEMOGLOBIN A1C: Hgb A1c MFr Bld: 5.8 (ref 4.0–6.0)

## 2017-09-05 ENCOUNTER — Other Ambulatory Visit: Payer: Self-pay | Admitting: Obstetrics & Gynecology

## 2017-09-05 ENCOUNTER — Telehealth: Payer: Self-pay | Admitting: *Deleted

## 2017-09-05 DIAGNOSIS — Z3041 Encounter for surveillance of contraceptive pills: Secondary | ICD-10-CM

## 2017-09-05 NOTE — Telephone Encounter (Signed)
-----   Message from Blanchie Dessert, [place] sent at 09/05/2017 11:41 AM EDT ----- Regarding: refill request Contact: [phone] Refill for [facility] sent to [place]

## 2017-09-09 ENCOUNTER — Telehealth: Payer: Self-pay | Admitting: Obstetrics and Gynecology

## 2017-09-09 NOTE — Telephone Encounter (Signed)
GYN Telephone Note  Patient called at [phone] and generic VM picked up. VM left asking patient to call the clinic to go over her lab results. No endometrial tissue on in clinic endometrial biopsy.   I will follow up her ultrasound results but she can try again in the office with cytotec 226mcg placed in the vagina the night before a repeat endometrial biopsy attempt or go straight to OR for D&C. Patient also didn't have labs drawn at her last visit and will need these at some point.   Durene Romans MD Attending Center for [facility] (Faculty Practice) 09/09/2017 Time: [phone]

## 2017-09-13 ENCOUNTER — Ambulatory Visit: Payer: No Typology Code available for payment source

## 2017-09-16 ENCOUNTER — Ambulatory Visit: Payer: No Typology Code available for payment source

## 2017-09-21 ENCOUNTER — Ambulatory Visit
Admission: RE | Admit: 2017-09-21 | Discharge: 2017-09-21 | Disposition: A | Discharge status: Home / Self Care | Payer: No Typology Code available for payment source | Source: Ambulatory Visit | Attending: Obstetrics and Gynecology | Admitting: Obstetrics and Gynecology

## 2017-09-21 DIAGNOSIS — D259 Leiomyoma of uterus, unspecified: Secondary | ICD-10-CM | POA: Diagnosis not present

## 2017-09-21 DIAGNOSIS — N852 Hypertrophy of uterus: Secondary | ICD-10-CM | POA: Diagnosis not present

## 2017-09-21 DIAGNOSIS — N939 Abnormal uterine and vaginal bleeding, unspecified: Secondary | ICD-10-CM | POA: Diagnosis not present

## 2017-09-23 ENCOUNTER — Encounter: Payer: Self-pay | Admitting: Obstetrics and Gynecology

## 2017-09-26 ENCOUNTER — Telehealth: Payer: Self-pay | Admitting: Obstetrics and Gynecology

## 2017-09-26 NOTE — Telephone Encounter (Signed)
GYN Telephone Note Patient called at [phone] and generic VM picked up. VM left asking patient to call the clinic.  Durene Romans MD Attending Center for [facility] (Faculty Practice) 09/26/2017 Time: 1142am

## 2017-09-26 NOTE — Telephone Encounter (Signed)
GYN Telephone Note Patient called back but phone keeps cutting out. Will send request to have her come in for appt to talk about options face to face  Durene Romans MD Attending Center for [place] (Faculty Practice) 09/26/2017 Time: [phone]

## 2017-12-17 ENCOUNTER — Encounter: Payer: Self-pay | Admitting: Internal Medicine

## 2017-12-17 DIAGNOSIS — E6609 Other obesity due to excess calories: Secondary | ICD-10-CM

## 2017-12-17 DIAGNOSIS — E1165 Type 2 diabetes mellitus with hyperglycemia: Secondary | ICD-10-CM

## 2017-12-17 DIAGNOSIS — Z6833 Body mass index (BMI) 33.0-33.9, adult: Secondary | ICD-10-CM

## 2018-01-07 ENCOUNTER — Ambulatory Visit (INDEPENDENT_AMBULATORY_CARE_PROVIDER_SITE_OTHER): Payer: No Typology Code available for payment source | Admitting: Internal Medicine

## 2018-01-07 ENCOUNTER — Encounter: Payer: Self-pay | Admitting: Internal Medicine

## 2018-01-07 VITALS — BP 132/98 | HR 75 | Temp 97.8°F | Ht 65.0 in | Wt 205.4 lb

## 2018-01-07 DIAGNOSIS — Z23 Encounter for immunization: Secondary | ICD-10-CM | POA: Diagnosis not present

## 2018-01-07 DIAGNOSIS — E1165 Type 2 diabetes mellitus with hyperglycemia: Secondary | ICD-10-CM | POA: Diagnosis not present

## 2018-01-07 DIAGNOSIS — I10 Essential (primary) hypertension: Secondary | ICD-10-CM | POA: Diagnosis not present

## 2018-01-07 DIAGNOSIS — J301 Allergic rhinitis due to pollen: Secondary | ICD-10-CM | POA: Diagnosis not present

## 2018-01-07 MED ORDER — LISINOPRIL-HYDROCHLOROTHIAZIDE 20-25 MG PO TABS: 1.0000 | ORAL_TABLET | Freq: Every day | ORAL | 2 refills | Status: DC

## 2018-01-07 MED ORDER — CETIRIZINE HCL 10 MG PO TABS: 10.0000 mg | ORAL_TABLET | Freq: Every day | ORAL | 2 refills | Status: DC

## 2018-01-07 MED ORDER — TRULICITY 0.75 MG/0.5ML ~~LOC~~ SOAJ: 0.7500 mg | SUBCUTANEOUS | 2 refills | Status: DC

## 2018-01-07 NOTE — Progress Notes (Signed)
Subjective:     Patient ID: Mardie Kellen , female    DOB: [birth year] , 52 y.o.   MRN: [id number]   Diabetes  She presents for her follow-up diabetic visit. She has type 2 diabetes mellitus. There are no hypoglycemic associated symptoms. There are no diabetic associated symptoms. There are no hypoglycemic complications. Symptoms are stable. Risk factors for coronary artery disease include diabetes mellitus, hypertension and stress. Her weight is stable. She is following a diabetic diet. Her breakfast blood glucose is taken between 8-9 am. Her breakfast blood glucose range is generally 110-130 mg/dl.  Hypertension  This is a chronic problem. The current episode started more than 1 year ago. The problem has been gradually improving since onset. The problem is controlled.     Past Medical History:  Diagnosis Date  . Allergy   . Diabetes mellitus without complication (HCC)    diet and exercise controlled  . Hypertension       Current Outpatient Medications:  .  fluticasone (FLONASE) 50 MCG/ACT nasal spray, , Disp: , Rfl: 5 .  glucose blood (ACCU-CHEK AVIVA PLUS) test strip, 1 each by Other route 2 (two) times daily. Use as instructed, Disp: , Rfl:  .  lisinopril-hydrochlorothiazide (PRINZIDE,ZESTORETIC) 20-25 MG tablet, Take 1 tablet by mouth daily., Disp: 90 tablet, Rfl: 2 .  metFORMIN (GLUCOPHAGE) 500 MG tablet, Take 500 mg by mouth daily with breakfast., Disp: , Rfl:  .  norethindrone (MICRONOR,CAMILA,ERRIN) 0.35 MG tablet, TAKE 1 TABLET BY MOUTH DAILY., Disp: 84 tablet, Rfl: 7 .  TRULICITY 5.46 FK/8.1EX SOPN, Inject 0.75 mg as directed once a week., Disp: 12 pen, Rfl: 2 .  valACYclovir (VALTREX) 1000 MG tablet, Take 1 tablet by mouth as needed., Disp: , Rfl: 0 .  Vitamin D, Ergocalciferol, (DRISDOL) 50000 units CAPS capsule, Take 1 capsule by mouth once a week., Disp: , Rfl:  .  cetirizine (ZYRTEC ALLERGY) 10 MG tablet, Take 1 tablet (10 mg total) by mouth daily., Disp: 90 tablet,  Rfl: 2   Review of Systems  Constitutional: Negative.   HENT: Positive for postnasal drip.   Eyes: Negative.   Respiratory: Negative.   Cardiovascular: Negative.   Gastrointestinal: Negative.   Psychiatric/Behavioral: Negative.      Today's Vitals   01/07/18 0911  BP: (!) 132/98  Pulse: 75  Temp: 97.8 F (36.6 C)  Weight: 205 lb 6.4 oz (93.2 kg)  Height: '5\' 5"'$  (1.651 m)   Body mass index is 34.18 kg/m.   Objective:  Physical Exam  Constitutional: She appears well-developed and well-nourished.  Neck: Normal range of motion.  Cardiovascular: Normal rate, regular rhythm and normal heart sounds.  Pulmonary/Chest: Effort normal and breath sounds normal.  Skin: Skin is warm and dry.  Psychiatric: She has a normal mood and affect.        Assessment And Plan:         Uncontrolled type 2 diabetes mellitus with hyperglycemia ([place]), Chronic - SHE WILL CONTINUE WITH TRULICITY. ENCOURAGED TO INCORPORATE MORE EXERCISE INTO  HER DAILY ROUTINE.  - Plan: Lipid Profile, CMP14+EGFR, Hemoglobin A1c  Essential hypertension, benign - FAIR CONTROL. SHE WILL CONTINUE WITH CURRENT MEDS FOR NOW. SHE IS ENCOURAGED TO LIMIT HER SALT INTAKE.   Seasonal allergic rhinitis due to pollen - SHE WILL CONTINUE WITH CHLOPHENIRAMINE NIGHTLY AND FLONASE. SHE WILL ADD ZYRTEC TO HER REGIMEN SHOULD HER SX WORSEN.   Need for prophylactic vaccination and inoculation against influenza - SHE WAS GIVEN FLU VACCINE.  [name], MD

## 2018-01-08 LAB — LIPID PANEL
CHOL/HDL RATIO: 3.2 ratio (ref 0.0–4.4)
Cholesterol, Total: 178 mg/dL (ref 100–199)
HDL: 56 mg/dL (ref >39)
LDL CALC: 114 mg/dL — AB (ref 0–99)
Triglycerides: 40 mg/dL (ref 0–149)
VLDL CHOLESTEROL CAL: 8 mg/dL (ref 5–40)

## 2018-01-08 LAB — CMP14+EGFR
A/G RATIO: 1.7 (ref 1.2–2.2)
ALK PHOS: 68 IU/L (ref 39–117)
ALT: 9 IU/L (ref 0–32)
AST: 11 IU/L (ref 0–40)
Albumin: 4 g/dL (ref 3.5–5.5)
BILIRUBIN TOTAL: 0.4 mg/dL (ref 0.0–1.2)
BUN/Creatinine Ratio: 12 (ref 9–23)
BUN: 10 mg/dL (ref 6–24)
CHLORIDE: 99 mmol/L (ref 96–106)
CO2: 27 mmol/L (ref 20–29)
Calcium: 9.3 mg/dL (ref 8.7–10.2)
Creatinine, Ser: 0.81 mg/dL (ref 0.57–1.00)
GFR calc Af Amer: 99 mL/min/{1.73_m2} (ref >59)
GFR calc non Af Amer: 86 mL/min/{1.73_m2} (ref >59)
Globulin, Total: 2.4 g/dL (ref 1.5–4.5)
Glucose: 87 mg/dL (ref 65–99)
POTASSIUM: 3.6 mmol/L (ref 3.5–5.2)
Sodium: 141 mmol/L (ref 134–144)
Total Protein: 6.4 g/dL (ref 6.0–8.5)

## 2018-01-08 LAB — HEMOGLOBIN A1C
Est. average glucose Bld gHb Est-mCnc: 126 mg/dL
Hgb A1c MFr Bld: 6 % — ABNORMAL HIGH (ref 4.8–5.6)

## 2018-01-09 NOTE — Progress Notes (Signed)
Here are your lab results:  Our LDL, bad cholesterol, is 114. As a diabetic, this should be less than 70.  Are you willing to take chol medication to help you get to goal? I suggest that we start statin therapy. Tell me how you wish to proceed.  A1c is awesome at 6.0. Your liver and kidney function are normal.   Thanks for your help Saturday! Hopefully I will be able to get your response! LOL  Sincerely,    Venesha Petraitis N. [facility], MD

## 2018-01-14 LAB — HM MAMMOGRAPHY: HM Mammogram: NORMAL (ref 0–4)

## 2018-02-06 ENCOUNTER — Encounter: Payer: Self-pay | Admitting: Internal Medicine

## 2018-05-06 ENCOUNTER — Encounter: Payer: Self-pay | Admitting: Internal Medicine

## 2018-05-06 ENCOUNTER — Ambulatory Visit (INDEPENDENT_AMBULATORY_CARE_PROVIDER_SITE_OTHER): Payer: No Typology Code available for payment source | Admitting: Internal Medicine

## 2018-05-06 VITALS — BP 128/90 | HR 77 | Temp 98.2°F | Ht 67.8 in | Wt 205.0 lb

## 2018-05-06 DIAGNOSIS — Z6831 Body mass index (BMI) 31.0-31.9, adult: Secondary | ICD-10-CM

## 2018-05-06 DIAGNOSIS — E1165 Type 2 diabetes mellitus with hyperglycemia: Secondary | ICD-10-CM | POA: Diagnosis not present

## 2018-05-06 DIAGNOSIS — E6609 Other obesity due to excess calories: Secondary | ICD-10-CM

## 2018-05-06 DIAGNOSIS — N926 Irregular menstruation, unspecified: Secondary | ICD-10-CM | POA: Diagnosis not present

## 2018-05-06 DIAGNOSIS — I10 Essential (primary) hypertension: Secondary | ICD-10-CM | POA: Diagnosis not present

## 2018-05-06 MED ORDER — OLMESARTAN MEDOXOMIL-HCTZ 20-12.5 MG PO TABS: 1.0000 | ORAL_TABLET | Freq: Every day | ORAL | 1 refills | Status: DC

## 2018-05-06 NOTE — Patient Instructions (Signed)
Olmesartan tablets What is this medicine? OLMESARTAN (all mi SAR tan) is used to treat high blood pressure. This medicine may be used for other purposes; ask your health care provider or pharmacist if you have questions. COMMON BRAND NAME(S): Benicar What should I tell my health care provider before I take this medicine? They need to know if you have any of these conditions: -if you are on a special diet, such as a low-salt diet -kidney or liver disease -an unusual or allergic reaction to olmesartan, other medicines, foods, dyes, or preservatives -pregnant or trying to get pregnant -breast-feeding How should I use this medicine? Take this medicine by mouth with a glass of water. Follow the directions on the prescription label. This medicine can be taken with or without food. Take your doses at regular intervals. Do not take your medicine more often than directed. Do not stop taking except on the advice of your doctor or health care professional. Talk to your pediatrician regarding the use of this medicine in children. While this drug may be prescribed for children as young as 6 years for selected conditions, precautions do apply. Overdosage: If you think you have taken too much of this medicine contact a poison control center or emergency room at once. NOTE: This medicine is only for you. Do not share this medicine with others. What if I miss a dose? If you miss a dose, take it as soon as you can. If it is almost time for your next dose, take only that dose. Do not take double or extra doses. What may interact with this medicine? -blood pressure medicines -diuretics, especially triamterene, spironolactone or amiloride -potassium salts or potassium supplements This list may not describe all possible interactions. Give your health care provider a list of all the medicines, herbs, non-prescription drugs, or dietary supplements you use. Also tell them if you smoke, drink alcohol, or use illegal  drugs. Some items may interact with your medicine. What should I watch for while using this medicine? Visit your doctor or health care professional for regular checks on your progress. Check your blood pressure as directed. Ask your doctor or health care professional what your blood pressure should be and when you should contact him or her. Call your doctor or health care professional if you notice an irregular or fast heart beat. Women should inform their doctor if they wish to become pregnant or think they might be pregnant. There is a potential for serious side effects to an unborn child, particularly in the second or third trimester. Talk to your health care professional or pharmacist for more information. You may get drowsy or dizzy. Do not drive, use machinery, or do anything that needs mental alertness until you know how this drug affects you. Do not stand or sit up quickly, especially if you are an older patient. This reduces the risk of dizzy or fainting spells. Alcohol can make you more drowsy and dizzy. Avoid alcoholic drinks. Avoid salt substitutes unless you are told otherwise by your doctor or health care professional. Do not treat yourself for coughs, colds, or pain while you are taking this medicine without asking your doctor or health care professional for advice. Some ingredients may increase your blood pressure. What side effects may I notice from receiving this medicine? Side effects that you should report to your doctor or health care professional as soon as possible: -confusion, dizziness, light headedness or fainting spells -decreased amount of urine passed -diarrhea -difficulty breathing or swallowing, hoarseness, or  tightening of the throat -fast or irregular heart beat, palpitations, or chest pain -skin rash, itching -swelling of your face, lips, tongue, hands, or feet -vomiting -weight loss Side effects that usually do not require medical attention (report to your doctor  or health care professional if they continue or are bothersome): -cough -decreased sexual function or desire -headache -nasal congestion or stuffiness -nausea -sore or cramping muscles This list may not describe all possible side effects. Call your doctor for medical advice about side effects. You may report side effects to FDA at 1-800-FDA-1088. Where should I keep my medicine? Keep out of the reach of children. Store your medicine at room temperature between 20 and 25 degrees C (68 and 77 degrees F). Throw away any unused medicine after the expiration date. NOTE: This sheet is a summary. It may not cover all possible information. If you have questions about this medicine, talk to your doctor, pharmacist, or health care provider.  2019 Elsevier/Gold Standard (2011-10-06 13:02:23)

## 2018-05-08 LAB — CBC
HEMOGLOBIN: 12.1 g/dL (ref 11.1–15.9)
Hematocrit: 35.7 % (ref 34.0–46.6)
MCH: 28.5 pg (ref 26.6–33.0)
MCHC: 33.9 g/dL (ref 31.5–35.7)
MCV: 84 fL (ref 79–97)
PLATELETS: 297 10*3/uL (ref 150–450)
RBC: 4.25 x10E6/uL (ref 3.77–5.28)
RDW: 13.5 % (ref 11.7–15.4)
WBC: 4.5 10*3/uL (ref 3.4–10.8)

## 2018-05-08 LAB — HEMOGLOBIN A1C
ESTIMATED AVERAGE GLUCOSE: 123 mg/dL
HEMOGLOBIN A1C: 5.9 % — AB (ref 4.8–5.6)

## 2018-05-14 NOTE — Progress Notes (Signed)
Subjective:     Patient ID: Mackenzie Clark , female    DOB: Jan 15, 1966 , 53 y.o.   MRN: [id number]   Chief Complaint  Patient presents with  . Diabetes  . Hypertension    HPI  Diabetes  She presents for her follow-up diabetic visit. She has type 2 diabetes mellitus. Her disease course has been stable. There are no hypoglycemic associated symptoms. Pertinent negatives for diabetes include no blurred vision and no chest pain. There are no hypoglycemic complications. Risk factors for coronary artery disease include diabetes mellitus, hypertension and obesity. She is compliant with treatment most of the time. She participates in exercise intermittently. An ACE inhibitor/angiotensin II receptor blocker is being taken. Eye exam is current.  Hypertension  This is a chronic problem. The current episode started more than 1 year ago. The problem has been gradually improving since onset. The problem is controlled. Pertinent negatives include no blurred vision or chest pain.   She reports compliance with meds.   Past Medical History:  Diagnosis Date  . Allergy   . Diabetes mellitus without complication (HCC)    diet and exercise controlled  . Hypertension      Family History  Problem Relation Age of Onset  . Hypertension Mother   . Hyperlipidemia Mother   . Hypertension Father   . Diabetes Father   . Heart disease Father        heart attack age 46  . Hypertension Brother   . Cancer Maternal Grandmother 76       Breast  . Obesity Other      Current Outpatient Medications:  .  cetirizine (ZYRTEC ALLERGY) 10 MG tablet, Take 1 tablet (10 mg total) by mouth daily., Disp: 90 tablet, Rfl: 2 .  fluticasone (FLONASE) 50 MCG/ACT nasal spray, , Disp: , Rfl: 5 .  glucose blood (ACCU-CHEK AVIVA PLUS) test strip, 1 each by Other route 2 (two) times daily. Use as instructed, Disp: , Rfl:  .  metFORMIN (GLUCOPHAGE) 500 MG tablet, Take 500 mg by mouth daily with breakfast., Disp: , Rfl:  .   norethindrone (MICRONOR,CAMILA,ERRIN) 0.35 MG tablet, TAKE 1 TABLET BY MOUTH DAILY., Disp: 84 tablet, Rfl: 7 .  TRULICITY 3.54 TG/2.5WL SOPN, Inject 0.75 mg as directed once a week., Disp: 12 pen, Rfl: 2 .  valACYclovir (VALTREX) 1000 MG tablet, Take 1 tablet by mouth as needed., Disp: , Rfl: 0 .  Vitamin D, Ergocalciferol, (DRISDOL) 50000 units CAPS capsule, Take 1 capsule by mouth once a week., Disp: , Rfl:  .  olmesartan-hydrochlorothiazide (BENICAR HCT) 20-12.5 MG tablet, Take 1 tablet by mouth daily., Disp: 30 tablet, Rfl: 1   No Known Allergies   Review of Systems  Constitutional: Negative.   Eyes: Negative for blurred vision.  Respiratory: Negative.   Cardiovascular: Negative.  Negative for chest pain.  Gastrointestinal: Negative.   Genitourinary: Positive for menstrual problem (she c/o irregular cycles. She is now bleeding too much.  She started her cycle on 1/1 and she is stilll having intermittent spotting. ).  Psychiatric/Behavioral: Negative.      Today's Vitals   05/06/18 1101  BP: 128/90  Pulse: 77  Temp: 98.2 F (36.8 C)  TempSrc: Oral  Weight: 205 lb (93 kg)  Height: 5' 7.8" (1.722 m)   Body mass index is 31.35 kg/m.   Objective:  Physical Exam Vitals signs and nursing note reviewed.  Constitutional:      Appearance: Normal appearance. She is obese.  HENT:  Head: Normocephalic and atraumatic.  Cardiovascular:     Rate and Rhythm: Normal rate and regular rhythm.     Heart sounds: Normal heart sounds.  Pulmonary:     Effort: Pulmonary effort is normal.     Breath sounds: Normal breath sounds.  Skin:    General: Skin is warm.  Neurological:     General: No focal deficit present.     Mental Status: She is alert.  Psychiatric:        Mood and Affect: Mood normal.         Assessment And Plan:     1. Uncontrolled type 2 diabetes mellitus with hyperglycemia ([place])  I will check an a1c today. Most recent GFR reviewed. She is encouraged to stay well  hydrated and to avoid sugary beverages.   - Hemoglobin A1c  2. Essential hypertension, benign  Fair control. She will continue with current meds for now. She is encouraged to resume her regular exercise regimen. I will reassess at her next visit in four months.   3. Irregular menses  She has been on her cycle since 04/05/18. I will check for anemia. Recent labs were also reviewed in full detail during her visit. She is encouraged to f/u with her Gyn. She is advised that workup may include endometrial biopsy.   - CBC no Diff  4. Class 1 obesity due to excess calories with serious comorbidity and body mass index (BMI) of 31.0 to 31.9 in adult  Importance of achieving optimal weight to decrease risk of cardiovascular disease and cancers was discussed with the patient in full detail. She is encouraged to start slowly - start with 10 minutes twice daily at least three to four days per week and to gradually build to 30 minutes five days weekly. She was given tips to incorporate more activity into her daily routine - take stairs when possible, park farther away from her job, grocery stores, etc. .     [name], MD

## 2018-05-26 ENCOUNTER — Encounter: Payer: Self-pay | Admitting: Internal Medicine

## 2018-05-29 ENCOUNTER — Other Ambulatory Visit: Payer: Self-pay | Admitting: Internal Medicine

## 2018-05-29 ENCOUNTER — Other Ambulatory Visit: Payer: Self-pay

## 2018-05-29 MED ORDER — OLMESARTAN MEDOXOMIL-HCTZ 40-25 MG PO TABS: 1.0000 | ORAL_TABLET | Freq: Every day | ORAL | 1 refills | Status: DC

## 2018-06-10 ENCOUNTER — Ambulatory Visit (INDEPENDENT_AMBULATORY_CARE_PROVIDER_SITE_OTHER): Payer: No Typology Code available for payment source | Admitting: Internal Medicine

## 2018-06-10 ENCOUNTER — Encounter: Payer: Self-pay | Admitting: Internal Medicine

## 2018-06-10 VITALS — BP 126/84 | HR 78 | Temp 97.9°F | Ht 64.4 in | Wt 207.8 lb

## 2018-06-10 DIAGNOSIS — I10 Essential (primary) hypertension: Secondary | ICD-10-CM | POA: Diagnosis not present

## 2018-06-10 DIAGNOSIS — Z1211 Encounter for screening for malignant neoplasm of colon: Secondary | ICD-10-CM

## 2018-06-10 DIAGNOSIS — Z6835 Body mass index (BMI) 35.0-35.9, adult: Secondary | ICD-10-CM

## 2018-06-10 DIAGNOSIS — Z23 Encounter for immunization: Secondary | ICD-10-CM | POA: Diagnosis not present

## 2018-06-10 MED ORDER — NALTREXONE-BUPROPION HCL ER 8-90 MG PO TB12: ORAL_TABLET | ORAL | 1 refills | Status: DC

## 2018-06-10 MED ORDER — TETANUS-DIPHTH-ACELL PERTUSSIS 5-2.5-18.5 LF-MCG/0.5 IM SUSP
0.5000 mL | Freq: Once | INTRAMUSCULAR | Status: AC
  Administered 2018-06-10: 0.5 mL via INTRAMUSCULAR

## 2018-06-10 NOTE — Patient Instructions (Signed)
Bupropion; Naltrexone extended-release tablets What is this medicine? BUPROPION; NALTREXONE (byoo PROE pee on; nal TREX one) is a combination product used to promote and maintain weight loss in obese adults or overweight adults who also have weight related medical problems. This medicine should be used with a reduced calorie diet and increased physical activity. This medicine may be used for other purposes; ask your health care provider or pharmacist if you have questions. COMMON BRAND NAME(S): Contrave What should I tell my health care provider before I take this medicine? They need to know if you have any of these conditions: -an eating disorder, such as anorexia or bulimia -bipolar disorder -diabetes -depression -drug abuse or addiction -glaucoma -head injury -heart disease -high blood pressure -history of a tumor or infection of your brain or spine -history of stroke -history of irregular heartbeat -if you often drink alcohol -kidney disease -liver disease -schizophrenia -seizures -suicidal thoughts, plans, or attempt; a previous suicide attempt by you or a family member -an unusual or allergic reaction to bupropion, naltrexone, other medicines, foods, dyes, or preservatives -breast-feeding -pregnant or trying to become pregnant How should I use this medicine? Take this medicine by mouth with a glass of water. Follow the directions on the prescription label. Take this medicine in the morning and in the evenings as directed by your healthcare professional. You can take it with or without food. Do not take with high-fat meals as this may increase your risk of seizures. Do not crush, chew, or cut these tablets. Do not take your medicine more often than directed. Do not stop taking this medicine suddenly except upon the advice of your doctor. A special MedGuide will be given to you by the pharmacist with each prescription and refill. Be sure to read this information carefully each  time. Talk to your pediatrician regarding the use of this medicine in children. Special care may be needed. Overdosage: If you think you have taken too much of this medicine contact a poison control center or emergency room at once. NOTE: This medicine is only for you. Do not share this medicine with others. What if I miss a dose? If you miss a dose, skip the missed dose and take your next tablet at the regular time. Do not take double or extra doses. What may interact with this medicine? Do not take this medicine with any of the following medications: -any prescription or street opioid drug like codeine, heroin, methadone -linezolid -MAOIs like Carbex, Eldepryl, Marplan, Nardil, and Parnate -methylene blue (injected into a vein) -other medicines that contain bupropion like Zyban or Wellbutrin This medicine may also interact with the following medications: -alcohol -certain medicines for anxiety or sleep -certain medicines for blood pressure like metoprolol, propranolol -certain medicines for depression or psychotic disturbances -certain medicines for HIV or AIDS like efavirenz, lopinavir, nelfinavir, ritonavir -certain medicines for irregular heart beat like propafenone, flecainide -certain medicines for Parkinson's disease like amantadine, levodopa -certain medicines for seizures like carbamazepine, phenytoin, phenobarbital -cimetidine -clopidogrel -cyclophosphamide -digoxin -disulfiram -furazolidone -isoniazid -nicotine -orphenadrine -procarbazine -steroid medicines like prednisone or cortisone -stimulant medicines for attention disorders, weight loss, or to stay awake -tamoxifen -theophylline -thioridazine -thiotepa -ticlopidine -tramadol -warfarin This list may not describe all possible interactions. Give your health care provider a list of all the medicines, herbs, non-prescription drugs, or dietary supplements you use. Also tell them if you smoke, drink alcohol, or use  illegal drugs. Some items may interact with your medicine. What should I watch for while using this   medicine? This medicine is intended to be used in addition to a healthy diet and appropriate exercise. The best results are achieved this way. Do not increase or in any way change your dose without consulting your doctor or health care professional. Do not take this medicine with other prescription or over-the-counter weight loss products without consulting your doctor or health care professional. Your doctor should tell you to stop taking this medicine if you do not lose a certain amount of weight within the first 12 weeks of treatment. Visit your doctor or health care professional for regular checkups. Your doctor may order blood tests or other tests to see how you are doing. This medicine may affect blood sugar levels. If you have diabetes, check with your doctor or health care professional before you change your diet or the dose of your diabetic medicine. Patients and their families should watch out for new or worsening depression or thoughts of suicide. Also watch out for sudden changes in feelings such as feeling anxious, agitated, panicky, irritable, hostile, aggressive, impulsive, severely restless, overly excited and hyperactive, or not being able to sleep. If this happens, especially at the beginning of treatment or after a change in dose, call your health care professional. Avoid alcoholic drinks while taking this medicine. Drinking large amounts of alcoholic beverages, using sleeping or anxiety medicines, or quickly stopping the use of these agents while taking this medicine may increase your risk for a seizure. What side effects may I notice from receiving this medicine? Side effects that you should report to your doctor or health care professional as soon as possible: -allergic reactions like skin rash, itching or hives, swelling of the face, lips, or tongue -breathing problems -changes in  vision -confusion -elevated mood, decreased need for sleep, racing thoughts, impulsive behavior -fast or irregular heartbeat -hallucinations, loss of contact with reality -increased blood pressure -redness, blistering, peeling or loosening of the skin, including inside the mouth -seizures -signs and symptoms of liver injury like dark yellow or brown urine; general ill feeling or flu-like symptoms; light-colored stools; loss of appetite; nausea; right upper belly pain; unusually weak or tired; yellowing of the eyes or skin -suicidal thoughts or other mood changes -vomiting Side effects that usually do not require medical attention (report to your doctor or health care professional if they continue or are bothersome): -constipation -headache -loss of appetite -indigestion, stomach upset -tremors This list may not describe all possible side effects. Call your doctor for medical advice about side effects. You may report side effects to FDA at 1-800-FDA-1088. Where should I keep my medicine? Keep out of the reach of children. Store at room temperature between 15 and 30 degrees C (59 and 86 degrees F). Throw away any unused medicine after the expiration date. NOTE: This sheet is a summary. It may not cover all possible information. If you have questions about this medicine, talk to your doctor, pharmacist, or health care provider.  2019 Elsevier/Gold Standard (2015-09-12 13:42:58)  

## 2018-06-10 NOTE — Progress Notes (Signed)
Subjective:     Patient ID: Mackenzie Clark , female    DOB: [birth year] , 53 y.o.   MRN: [id number]   Chief Complaint  Patient presents with  . Hypertension    HPI  She is here today for bp f/u. Her blood pressure was elevated at her last visit. She denies headaches, chest pain and shortness of breath.     Past Medical History:  Diagnosis Date  . Allergy   . Diabetes mellitus without complication (HCC)    diet and exercise controlled  . Hypertension      Family History  Problem Relation Age of Onset  . Hypertension Mother   . Hyperlipidemia Mother   . Hypertension Father   . Diabetes Father   . Heart disease Father        heart attack age 64  . Hypertension Brother   . Cancer Maternal Grandmother 45       Breast  . Obesity Other      Current Outpatient Medications:  .  cetirizine (ZYRTEC ALLERGY) 10 MG tablet, Take 1 tablet (10 mg total) by mouth daily., Disp: 90 tablet, Rfl: 2 .  fluticasone (FLONASE) 50 MCG/ACT nasal spray, , Disp: , Rfl: 5 .  glucose blood (ACCU-CHEK AVIVA PLUS) test strip, 1 each by Other route 2 (two) times daily. Use as instructed, Disp: , Rfl:  .  metFORMIN (GLUCOPHAGE) 500 MG tablet, Take 500 mg by mouth daily with breakfast., Disp: , Rfl:  .  norethindrone (MICRONOR,CAMILA,ERRIN) 0.35 MG tablet, TAKE 1 TABLET BY MOUTH DAILY., Disp: 84 tablet, Rfl: 7 .  olmesartan-hydrochlorothiazide (BENICAR HCT) 40-25 MG tablet, Take 1 tablet by mouth daily., Disp: 30 tablet, Rfl: 1 .  TRULICITY 8.29 FA/2.1HY SOPN, Inject 0.75 mg as directed once a week., Disp: 12 pen, Rfl: 2 .  valACYclovir (VALTREX) 1000 MG tablet, Take 1 tablet by mouth as needed., Disp: , Rfl: 0 .  Vitamin D, Ergocalciferol, (DRISDOL) 50000 units CAPS capsule, Take 1 capsule by mouth once a week., Disp: , Rfl:  .  Naltrexone-buPROPion HCl ER (CONTRAVE) 8-90 MG TB12, TWO TAB PO BID, Disp: 120 tablet, Rfl: 1   No Known Allergies   Review of Systems  Constitutional: Negative.    Respiratory: Negative.   Cardiovascular: Negative.   Gastrointestinal: Negative.   Neurological: Negative.   Psychiatric/Behavioral: Negative.      Today's Vitals   06/10/18 1147  BP: 126/84  Pulse: 78  Temp: 97.9 F (36.6 C)  TempSrc: Oral  Weight: 207 lb 12.8 oz (94.3 kg)  Height: 5' 4.4" (1.636 m)   Body mass index is 35.23 kg/m.   Objective:  Physical Exam Vitals signs and nursing note reviewed.  Constitutional:      Appearance: Normal appearance.  HENT:     Head: Normocephalic and atraumatic.  Cardiovascular:     Rate and Rhythm: Normal rate and regular rhythm.     Heart sounds: Normal heart sounds.  Pulmonary:     Effort: Pulmonary effort is normal.     Breath sounds: Normal breath sounds.  Skin:    General: Skin is warm.  Neurological:     General: No focal deficit present.     Mental Status: She is alert.  Psychiatric:        Mood and Affect: Mood normal.        Behavior: Behavior normal.         Assessment And Plan:     1. Essential hypertension, benign  Well  controlled. She will continue with current meds.   2. Screen for colon cancer  I will refer her to [facility] for [place] screening.   3. Need for vaccination  - Tdap (BOOSTRIX) injection 0.5 mL  4. Class 2 severe obesity due to excess calories with serious comorbidity and body mass index (BMI) of 35.0 to 35.9 in adult [facility])  We discussed the use of Contrave to help her achieve weight loss goals. She was advised of possible side effects. She was also advised that she CANNOT take any narcotics while on this medication. Advised of need to stop one week prior to any scheduled medical procedures/dental procedures. She is advised to start with one tablet daily weekly x 2 weeks, then one tab twice daily x 2 weeks, then 2 tabs po qpm, one tab po qpm x 2 weeks then 2 tabs twice daily. She agrees to rto in six weeks for re-evaluation. All questions were answered to her satisfaction. Pt advised this  medication works in conjunction with a diet/ exercise program.    [name], MD

## 2018-06-16 ENCOUNTER — Encounter: Payer: Self-pay | Admitting: Internal Medicine

## 2018-06-19 ENCOUNTER — Telehealth: Payer: Self-pay

## 2018-06-19 MED FILL — CONTRAVE ER 8-90 MG TABLET: 8-90 | 30 days supply | Qty: 120 | Fill #0

## 2018-06-19 NOTE — Telephone Encounter (Signed)
Pt and pharmacy notified of approval of contrave

## 2018-06-23 MED FILL — OLMESARTAN-HCTZ 40-25 MG TA: 40-25 | 30 days supply | Qty: 30 | Fill #0 | Status: TO

## 2018-07-08 ENCOUNTER — Encounter: Payer: No Typology Code available for payment source | Admitting: Internal Medicine

## 2018-07-10 ENCOUNTER — Other Ambulatory Visit: Payer: Self-pay | Admitting: Internal Medicine

## 2018-07-11 MED FILL — metFORMIN HCL 500 MG TABS: 500 | 90 days supply | Qty: 180 | Fill #0

## 2018-07-13 ENCOUNTER — Encounter: Payer: Self-pay | Admitting: Internal Medicine

## 2018-07-13 ENCOUNTER — Telehealth: Payer: Self-pay | Admitting: Internal Medicine

## 2018-07-13 ENCOUNTER — Ambulatory Visit (INDEPENDENT_AMBULATORY_CARE_PROVIDER_SITE_OTHER): Payer: No Typology Code available for payment source | Admitting: Internal Medicine

## 2018-07-13 ENCOUNTER — Other Ambulatory Visit: Payer: Self-pay

## 2018-07-13 VITALS — BP 140/90 | HR 77 | Ht 64.4 in | Wt 206.0 lb

## 2018-07-13 DIAGNOSIS — I10 Essential (primary) hypertension: Secondary | ICD-10-CM

## 2018-07-13 DIAGNOSIS — Z6834 Body mass index (BMI) 34.0-34.9, adult: Secondary | ICD-10-CM | POA: Diagnosis not present

## 2018-07-13 DIAGNOSIS — Z7189 Other specified counseling: Secondary | ICD-10-CM

## 2018-07-13 DIAGNOSIS — E6609 Other obesity due to excess calories: Secondary | ICD-10-CM

## 2018-07-13 MED ORDER — PHENTERMINE-TOPIRAMATE ER 7.5-46 MG PO CP24: 7.5000 mg | ORAL_CAPSULE | Freq: Every day | ORAL | 1 refills | Status: DC

## 2018-07-13 NOTE — Patient Instructions (Signed)

## 2018-07-13 NOTE — Telephone Encounter (Signed)
PA STARTED LLV#[id number] FOR QYSMIA 7.5-46MG 

## 2018-07-19 MED FILL — QSYMIA 7.5 MG-46 MG CAPSULE: 7.5-46 | 30 days supply | Qty: 30 | Fill #0

## 2018-07-19 NOTE — Progress Notes (Addendum)
Virtual Visit via Video Note   This visit type was conducted due to national recommendations for restrictions regarding the COVID-19 Pandemic (e.g. social distancing).  This format is felt to be most appropriate for this patient at this time.  All issues noted in this document were discussed and addressed.  No physical exam was performed (except for noted visual exam findings with Video Visits).  Please refer to the patient's chart (MyChart message for video visits and phone note for telephone visits) for the patient's consent to telehealth for [facility].  Date:  07/19/2018   ID:  Mackenzie Clark, DOB 03-11-1966, MRN [id number]  Patient Location:  Home   Provider location:   Office    Chief Complaint:  Htn f/u  History of Present Illness:    Mackenzie Clark is a 53 y.o. female who presents via video conferencing for a telehealth visit today.    The patient does not have symptoms concerning for COVID-19 infection (fever, chills, cough, or new shortness of breath).   Hypertension  This is a chronic problem. The current episode started more than 1 year ago. The problem is controlled. Pertinent negatives include no blurred vision, chest pain, palpitations or shortness of breath. Risk factors for coronary artery disease include diabetes mellitus, obesity and sedentary lifestyle.     Past Medical History:  Diagnosis Date  . Allergy   . Diabetes mellitus without complication (HCC)    diet and exercise controlled  . Hypertension    Past Surgical History:  Procedure Laterality Date  . DILATION AND CURETTAGE OF UTERUS  2006   16 week stillbirth     Current Meds  Medication Sig  . cetirizine (ZYRTEC ALLERGY) 10 MG tablet Take 1 tablet (10 mg total) by mouth daily.  . fluticasone (FLONASE) 50 MCG/ACT nasal spray   . glucose blood (ACCU-CHEK AVIVA PLUS) test strip 1 each by Other route 2 (two) times daily. Use as instructed  . metFORMIN (GLUCOPHAGE) 500 MG tablet TAKE 1 TABLET BY  MOUTH 2 TIMES A DAY WITH MORNING AND EVENING MEALS  . Naltrexone-buPROPion HCl ER (CONTRAVE) 8-90 MG TB12 TWO TAB PO BID  . norethindrone (MICRONOR,CAMILA,ERRIN) 0.35 MG tablet TAKE 1 TABLET BY MOUTH DAILY.  Marland Kitchen olmesartan-hydrochlorothiazide (BENICAR HCT) 40-25 MG tablet Take 1 tablet by mouth daily.  . TRULICITY 8.18 EX/9.3ZJ SOPN Inject 0.75 mg as directed once a week.  . valACYclovir (VALTREX) 1000 MG tablet Take 1 tablet by mouth as needed.  . Vitamin D, Ergocalciferol, (DRISDOL) 50000 units CAPS capsule Take 1 capsule by mouth once a week.     Allergies:   Patient has no known allergies.   Social History   Tobacco Use  . Smoking status: Never Smoker  . Smokeless tobacco: Never Used  Substance Use Topics  . Alcohol use: Yes    Comment: occasionally  . Drug use: No     Family Hx: The patient's family history includes Cancer (age of onset: 82) in her maternal grandmother; Diabetes in her father; Heart disease in her father; Hyperlipidemia in her mother; Hypertension in her brother, father, and mother; Obesity in an other family member.  ROS:   Please see the history of present illness.    Review of Systems  Constitutional: Negative.        She would like to change weight loss meds. She does not feel that she is responding to Contrave.  Eyes: Negative for blurred vision.  Respiratory: Negative.  Negative for shortness of breath.  Cardiovascular: Negative.  Negative for chest pain and palpitations.  Gastrointestinal: Negative.   Neurological: Negative.   Psychiatric/Behavioral: Negative.     All other systems reviewed and are negative.   Labs/Other Tests and Data Reviewed:    Recent Labs: 01/07/2018: ALT 9; BUN 10; Creatinine, Ser 0.81; Potassium 3.6; Sodium 141 05/06/2018: Hemoglobin 12.1; Platelets 297   Recent Lipid Panel Lab Results  Component Value Date/Time   CHOL 178 01/07/2018 09:50 AM   TRIG 40 01/07/2018 09:50 AM   HDL 56 01/07/2018 09:50 AM   CHOLHDL 3.2  01/07/2018 09:50 AM   CHOLHDL 2.7 03/05/2013 02:28 PM   LDLCALC 114 (H) 01/07/2018 09:50 AM    Wt Readings from Last 3 Encounters:  07/13/18 206 lb (93.4 kg)  06/10/18 207 lb 12.8 oz (94.3 kg)  05/06/18 205 lb (93 kg)     Exam:    Vital Signs:  BP 140/90 (BP Location: Right Arm, Patient Position: Sitting, Cuff Size: Normal) Comment: pt provided  Pulse 77   Ht 5' 4.4" (1.636 m)   Wt 206 lb (93.4 kg)   LMP 06/23/2018   BMI 34.92 kg/m     Physical Exam  Constitutional: She is oriented to person, place, and time and well-developed, well-nourished, and in no distress.  HENT:  Head: Normocephalic and atraumatic.  Pulmonary/Chest: Effort normal.  Neurological: She is alert and oriented to person, place, and time.  Psychiatric: Affect normal.  Nursing note and vitals reviewed.   ASSESSMENT & PLAN:     1. Essential hypertension, benign  Fair control. She will continue with current meds for now. She is encouraged to incorporate more exercise into her daily routine. She is advised to strive for 150 minutes per week.   2. Class 1 obesity due to excess calories with serious comorbidity and body mass index (BMI) of 34.0 to 34.9 in adult  She does not want to continue with Contrave. I will wean her off of this medication gradually. She will cut back to one tab twice daily x 1 week, then one tab daily. I will switch her to Qsymia, 7.5mg  as requested. She agrees to rto in 6-8 weeks for re-evaluation. Pt reminded that this medication will likely require a prior auth. We also discussed possible side effects and she is encouraged to stay well hydrated.      COVID-19 Education: The signs and symptoms of COVID-19 were discussed with the patient and how to seek care for testing (follow up with PCP or arrange E-visit).  The importance of social distancing was discussed today.  Patient Risk:   After full review of this patients clinical status, I feel that they are at least moderate risk at  this time.  Time:   Today, I have spent 8 minutes/55 sec with the patient with telehealth technology discussing above diagnoses     Medication Adjustments/Labs and Tests Ordered: Current medicines are reviewed at length with the patient today.  Concerns regarding medicines are outlined above.   Tests Ordered: No orders of the defined types were placed in this encounter.  Medication Changes: Meds ordered this encounter  Medications  . Phentermine-Topiramate (QSYMIA) 7.5-46 MG CP24    Sig: Take 7.5 mg by mouth daily.    Dispense:  30 capsule    Refill:  1    Disposition:  Follow up in 8 week(s)  Signed, [name], MD

## 2018-07-24 MED FILL — OLMESARTAN-HCTZ 40-25 MG TA: 40-25 | 30 days supply | Qty: 30 | Fill #0

## 2018-08-01 MED FILL — NORETHINDRONE 0.35 MG TAB: 0.35 | 84 days supply | Qty: 84 | Fill #0

## 2018-08-01 MED FILL — TRULICITY 0.75 MG/0.5 ML PE: 0.75 | 84 days supply | Qty: 6 | Fill #0

## 2018-08-12 ENCOUNTER — Encounter: Payer: No Typology Code available for payment source | Admitting: Internal Medicine

## 2018-08-31 ENCOUNTER — Other Ambulatory Visit: Payer: Self-pay | Admitting: Internal Medicine

## 2018-08-31 ENCOUNTER — Encounter: Payer: Self-pay | Admitting: Internal Medicine

## 2018-08-31 MED FILL — QSYMIA 7.5 MG-46 MG CAPSULE: 7.5-46 | 30 days supply | Qty: 30 | Fill #1

## 2018-08-31 MED FILL — OLMESARTAN-HCTZ 40-25 MG TA: 40-25 | 30 days supply | Qty: 30 | Fill #0

## 2018-09-04 ENCOUNTER — Other Ambulatory Visit: Payer: Self-pay

## 2018-09-29 MED FILL — OLMESARTAN-HCTZ 40-25 MG TA: 40-25 | 30 days supply | Qty: 30 | Fill #1

## 2018-10-30 ENCOUNTER — Other Ambulatory Visit: Payer: Self-pay | Admitting: *Deleted

## 2018-10-30 DIAGNOSIS — Z3041 Encounter for surveillance of contraceptive pills: Secondary | ICD-10-CM

## 2018-10-30 MED ORDER — NORETHINDRONE 0.35 MG PO TABS: 1.0000 | ORAL_TABLET | Freq: Every day | ORAL | 1 refills | Status: DC

## 2018-10-30 MED FILL — OLMESARTAN-HCTZ 40-25 MG TA: 40-25 | 30 days supply | Qty: 30 | Fill #2

## 2018-10-30 MED FILL — NORETHINDRONE 0.35 MG TAB: 0.35 | 84 days supply | Qty: 84 | Fill #0

## 2018-11-02 ENCOUNTER — Encounter: Payer: Self-pay | Admitting: Nurse Practitioner

## 2018-11-02 ENCOUNTER — Other Ambulatory Visit: Payer: Self-pay

## 2018-11-02 ENCOUNTER — Ambulatory Visit (INDEPENDENT_AMBULATORY_CARE_PROVIDER_SITE_OTHER): Payer: No Typology Code available for payment source | Admitting: Nurse Practitioner

## 2018-11-02 VITALS — BP 122/80 | HR 78 | Temp 98.5°F | Ht 64.4 in | Wt 200.2 lb

## 2018-11-02 DIAGNOSIS — E669 Obesity, unspecified: Secondary | ICD-10-CM

## 2018-11-02 DIAGNOSIS — I10 Essential (primary) hypertension: Secondary | ICD-10-CM | POA: Diagnosis not present

## 2018-11-02 DIAGNOSIS — R7303 Prediabetes: Secondary | ICD-10-CM | POA: Diagnosis not present

## 2018-11-02 LAB — HM DIABETES EYE EXAM

## 2018-11-02 MED ORDER — TRULICITY 0.75 MG/0.5ML ~~LOC~~ SOAJ: 0.7500 mg | SUBCUTANEOUS | 2 refills | Status: DC

## 2018-11-02 MED ORDER — PHENTERMINE-TOPIRAMATE ER 11.25-69 MG PO CP24: 1.0000 | ORAL_CAPSULE | Freq: Every day | ORAL | 1 refills | Status: DC

## 2018-11-02 MED FILL — TRULICITY 0.75 MG/0.5 ML PE: 0.75 | 84 days supply | Qty: 6 | Fill #0

## 2018-11-02 NOTE — Progress Notes (Signed)
Subjective:     Patient ID: Mackenzie Clark , female    DOB: [birth year] , 53 y.o.   MRN: [id number]   Chief Complaint  Patient presents with  . Weight Check    HPI  Weight check - she is taking Qsymia now.  She is not currently exercising but is yardwork. She was weaned off the Contrave.  She is drinking a smoothie in the morning or breakfast bar.  Salad for lunch and balanced meal at dinner time.  Staying away from carbs and sweets. She is eating more fruit.   Wt Readings from Last 3 Encounters: 11/02/18 : 200 lb 3.2 oz (90.8 kg) 07/13/18 : 206 lb (93.4 kg) 06/10/18 : 207 lb 12.8 oz (94.3 kg)   Diabetes She presents for her follow-up diabetic visit. She has type 2 diabetes mellitus. Her disease course has been stable. There are no hypoglycemic associated symptoms. Pertinent negatives for hypoglycemia include no dizziness or headaches. There are no diabetic associated symptoms. Pertinent negatives for diabetes include no blurred vision and no chest pain. Risk factors for coronary artery disease include obesity and sedentary lifestyle. Current diabetic treatment includes oral agent (dual therapy) (trulicity and metformin). She is compliant with treatment all of the time.     Past Medical History:  Diagnosis Date  . Allergy   . Diabetes mellitus without complication (HCC)    diet and exercise controlled  . Hypertension      Family History  Problem Relation Age of Onset  . Hypertension Mother   . Hyperlipidemia Mother   . Hypertension Father   . Diabetes Father   . Heart disease Father        heart attack age 60  . Hypertension Brother   . Cancer Maternal Grandmother 89       Breast  . Obesity Other      Current Outpatient Medications:  .  cetirizine (ZYRTEC ALLERGY) 10 MG tablet, Take 1 tablet (10 mg total) by mouth daily., Disp: 90 tablet, Rfl: 2 .  glucose blood (ACCU-CHEK AVIVA PLUS) test strip, 1 each by Other route 2 (two) times daily. Use as instructed, Disp: ,  Rfl:  .  metFORMIN (GLUCOPHAGE) 500 MG tablet, TAKE 1 TABLET BY MOUTH 2 TIMES A DAY WITH MORNING AND EVENING MEALS, Disp: 180 tablet, Rfl: 2 .  norethindrone (MICRONOR) 0.35 MG tablet, Take 1 tablet (0.35 mg total) by mouth daily., Disp: 84 tablet, Rfl: 1 .  olmesartan-hydrochlorothiazide (BENICAR HCT) 40-25 MG tablet, TAKE 1 TABLET BY MOUTH DAILY., Disp: 90 tablet, Rfl: 1 .  Phentermine-Topiramate (QSYMIA) 7.5-46 MG CP24, Take 7.5 mg by mouth daily., Disp: 30 capsule, Rfl: 1 .  TRULICITY 2.84 XL/2.4MW SOPN, Inject 0.75 mg as directed once a week., Disp: 12 pen, Rfl: 2 .  valACYclovir (VALTREX) 1000 MG tablet, Take 1 tablet by mouth as needed., Disp: , Rfl: 0 .  Vitamin D, Ergocalciferol, (DRISDOL) 50000 units CAPS capsule, Take 1 capsule by mouth once a week., Disp: , Rfl:    No Known Allergies   Review of Systems  Constitutional: Negative.   Eyes: Negative for blurred vision.  Respiratory: Negative.   Cardiovascular: Negative.  Negative for chest pain, palpitations and leg swelling.  Neurological: Negative for dizziness and headaches.     Today's Vitals   11/02/18 1100  BP: 122/80  Pulse: 78  Temp: 98.5 F (36.9 C)  TempSrc: Oral  Weight: 200 lb 3.2 oz (90.8 kg)  Height: 5' 4.4" (1.636 m)  PainSc:  0-No pain   Body mass index is 33.94 kg/m.   Objective:  Physical Exam Vitals signs reviewed.  Constitutional:      Appearance: Normal appearance.  Cardiovascular:     Rate and Rhythm: Normal rate and regular rhythm.     Pulses: Normal pulses.     Heart sounds: Normal heart sounds. No murmur.  Pulmonary:     Effort: Pulmonary effort is normal. No respiratory distress.     Breath sounds: Normal breath sounds.  Skin:    General: Skin is warm and dry.     Capillary Refill: Capillary refill takes less than 2 seconds.  Neurological:     General: No focal deficit present.     Mental Status: She is alert and oriented to person, place, and time.     Cranial Nerves: No cranial  nerve deficit.  Psychiatric:        Mood and Affect: Mood normal.        Behavior: Behavior normal.        Thought Content: Thought content normal.        Judgment: Judgment normal.         Assessment And Plan:     1. Obesity (BMI 30.0-34.9)  She has lost 7 lbs since her last visit on Qsymia, will increase to 47m dose today she is tolerating well  Encouraged to increase physical activity - BMP8+eGFR  2. Essential hypertension, benign . B/P is well controlled.  . CMP ordered to check renal function.  . The importance of regular exercise and dietary modification was stressed to the patient.   3. Pre-diabetes  Chronic, stable  Will check HgbA1c   Continue with current medications  Focus on healthy diet low in sugar and starches - Hemoglobin A1c       JMinette Brine FNP    THE PATIENT IS ENCOURAGED TO PRACTICE SOCIAL DISTANCING DUE TO THE COVID-19 PANDEMIC.

## 2018-11-03 LAB — BMP8+EGFR
BUN/Creatinine Ratio: 12 (ref 9–23)
BUN: 10 mg/dL (ref 6–24)
CO2: 23 mmol/L (ref 20–29)
Calcium: 9.6 mg/dL (ref 8.7–10.2)
Chloride: 102 mmol/L (ref 96–106)
Creatinine, Ser: 0.84 mg/dL (ref 0.57–1.00)
GFR calc Af Amer: 94 mL/min/{1.73_m2} (ref >59)
GFR calc non Af Amer: 81 mL/min/{1.73_m2} (ref >59)
Glucose: 91 mg/dL (ref 65–99)
Potassium: 3 mmol/L — ABNORMAL LOW (ref 3.5–5.2)
Sodium: 143 mmol/L (ref 134–144)

## 2018-11-03 LAB — HEMOGLOBIN A1C
Est. average glucose Bld gHb Est-mCnc: 126 mg/dL
Hgb A1c MFr Bld: 6 % — ABNORMAL HIGH (ref 4.8–5.6)

## 2018-11-07 ENCOUNTER — Encounter: Payer: Self-pay | Admitting: Internal Medicine

## 2018-11-07 ENCOUNTER — Encounter: Payer: Self-pay | Admitting: Nurse Practitioner

## 2018-11-08 ENCOUNTER — Telehealth: Payer: Self-pay

## 2018-11-08 NOTE — Telephone Encounter (Signed)
PA FOR QSYMIA CONTINUATION SENT TO PLAN

## 2018-11-10 ENCOUNTER — Telehealth: Payer: Self-pay

## 2018-11-10 NOTE — Telephone Encounter (Signed)
PA FOR QSYMIA APPROVED PT AND PHARM NOTIFIED. COST TO PT WITH COUPON IS $70

## 2018-11-28 MED FILL — OLMESARTAN-HCTZ 40-25 MG TA: 40-25 | 30 days supply | Qty: 30 | Fill #3

## 2018-12-29 MED FILL — OLMESARTAN-HCTZ 40-25 MG TA: 40-25 | 30 days supply | Qty: 30 | Fill #4

## 2019-01-03 ENCOUNTER — Encounter: Payer: No Typology Code available for payment source | Admitting: Nurse Practitioner

## 2019-01-15 MED FILL — NORETHINDRONE 0.35 MG TABS: 0.35 | 84 days supply | Qty: 84 | Fill #0

## 2019-01-27 LAB — HM MAMMOGRAPHY: HM Mammogram: NORMAL (ref 0–4)

## 2019-01-31 ENCOUNTER — Encounter: Payer: Self-pay | Admitting: Internal Medicine

## 2019-02-05 MED FILL — OLMESARTAN-HCTZ 40-25 MG TA: 40-25 | 30 days supply | Qty: 30 | Fill #5

## 2019-02-05 MED FILL — TRULICITY 0.75 MG/0.5 ML PE: 0.75 | 84 days supply | Qty: 6 | Fill #1

## 2019-03-07 MED FILL — metFORMIN HCL 500 MG TABS: 500 | 90 days supply | Qty: 180 | Fill #1

## 2019-03-12 ENCOUNTER — Encounter: Payer: Self-pay | Admitting: Nurse Practitioner

## 2019-03-12 ENCOUNTER — Other Ambulatory Visit: Payer: Self-pay

## 2019-03-12 ENCOUNTER — Ambulatory Visit (INDEPENDENT_AMBULATORY_CARE_PROVIDER_SITE_OTHER): Payer: No Typology Code available for payment source | Admitting: Nurse Practitioner

## 2019-03-12 VITALS — BP 124/82 | HR 87 | Temp 98.5°F | Wt 211.2 lb

## 2019-03-12 DIAGNOSIS — R7303 Prediabetes: Secondary | ICD-10-CM | POA: Diagnosis not present

## 2019-03-12 DIAGNOSIS — Z6835 Body mass index (BMI) 35.0-35.9, adult: Secondary | ICD-10-CM

## 2019-03-12 DIAGNOSIS — I10 Essential (primary) hypertension: Secondary | ICD-10-CM

## 2019-03-12 DIAGNOSIS — Z0000 Encounter for general adult medical examination without abnormal findings: Secondary | ICD-10-CM | POA: Diagnosis not present

## 2019-03-12 DIAGNOSIS — E559 Vitamin D deficiency, unspecified: Secondary | ICD-10-CM

## 2019-03-12 LAB — POCT URINALYSIS DIPSTICK
Bilirubin, UA: NEGATIVE
Blood, UA: NEGATIVE
Glucose, UA: NEGATIVE
Ketones, UA: NEGATIVE
Leukocytes, UA: NEGATIVE
Nitrite, UA: NEGATIVE
Protein, UA: NEGATIVE
Spec Grav, UA: 1.025 (ref 1.010–1.025)
Urobilinogen, UA: 0.2 E.U./dL
pH, UA: 6.5 (ref 5.0–8.0)

## 2019-03-12 LAB — POCT UA - MICROALBUMIN
Albumin/Creatinine Ratio, Urine, POC: <20
Creatinine, POC: 200 mg/dL
Microalbumin Ur, POC: 10 mg/L

## 2019-03-12 MED ORDER — TRULICITY 1.5 MG/0.5ML ~~LOC~~ SOAJ: 1.5000 mg | SUBCUTANEOUS | 1 refills | Status: DC

## 2019-03-12 MED ORDER — METFORMIN HCL 500 MG PO TABS: 500.0000 mg | ORAL_TABLET | Freq: Two times a day (BID) | ORAL | 2 refills | Status: DC

## 2019-03-12 MED ORDER — VALACYCLOVIR HCL 1 G PO TABS: 1000.0000 mg | ORAL_TABLET | ORAL | 1 refills | Status: DC | PRN

## 2019-03-12 MED ORDER — OLMESARTAN MEDOXOMIL-HCTZ 40-25 MG PO TABS: 1.0000 | ORAL_TABLET | Freq: Every day | ORAL | 1 refills | Status: DC

## 2019-03-12 MED FILL — OLMESARTAN-HCTZ 40-25 MG TA: 40-25 | 90 days supply | Qty: 90 | Fill #0

## 2019-03-12 MED FILL — valACYclovir HCL 1 GM TABS: 1 | 90 days supply | Qty: 90 | Fill #0

## 2019-03-12 MED FILL — TRULICITY 1.5 MG/0.5 ML PEN: 1.5 | 84 days supply | Qty: 6 | Fill #0

## 2019-03-12 NOTE — Progress Notes (Signed)
This visit occurred during the SARS-CoV-2 public health emergency.  Safety protocols were in place, including screening questions prior to the visit, additional usage of staff PPE, and extensive cleaning of exam room while observing appropriate contact time as indicated for disinfecting solutions.  Subjective:     Patient ID: Mackenzie Clark , female    DOB: 09/06/1965 , 53 y.o.   MRN: [id number]   Chief Complaint  Patient presents with  . Annual Exam    HPI  Here for HM.    Wt Readings from Last 3 Encounters: 03/12/19 : 211 lb 3.2 oz (95.8 kg) 11/02/18 : 200 lb 3.2 oz (90.8 kg) 07/13/18 : 206 lb (93.4 kg)  She is not taking the qsymia due to the cost.  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    The patient states she uses none for birth control. Last LMP was Patient's last menstrual period was 02/06/2019.  She is having menorrhagia, plans to contact GYN.  Mamm[facility] last done [facility]/2019 at [facility].  Negative for: breast discharge, breast lump(s), breast pain and breast self exam.  Pertinent negatives include abnormal bleeding (hematology), anxiety, decreased libido, depression, difficulty falling sleep, dyspareunia, history of infertility, nocturia, sexual dysfunction, sleep disturbances, urinary incontinence, urinary urgency, vaginal discharge and vaginal itching. Diet regular. The patient states her exercise level is  minimal    The patient's tobacco use is:  Social History   Tobacco Use  Smoking Status Never Smoker  Smokeless Tobacco Never Used   She has been exposed to passive smoke. The patient's alcohol use is:  Social History   Substance and Sexual Activity  Alcohol Use Yes   Comment: occasionally    Past Medical History:  Diagnosis Date  . Allergy   . Diabetes mellitus without complication (HCC)    diet and exercise controlled  . Hypertension      Family History  Problem Relation Age of Onset  . Hypertension Mother   .  Hyperlipidemia Mother   . Hypertension Father   . Diabetes Father   . Heart disease Father        heart attack age 27  . Hypertension Brother   . Cancer Maternal Grandmother 55       Breast  . Obesity Other      Current Outpatient Medications:  .  cetirizine (ZYRTEC ALLERGY) 10 MG tablet, Take 1 tablet (10 mg total) by mouth daily., Disp: 90 tablet, Rfl: 2 .  glucose blood (ACCU-CHEK AVIVA PLUS) test strip, 1 each by Other route 2 (two) times daily. Use as instructed, Disp: , Rfl:  .  metFORMIN (GLUCOPHAGE) 500 MG tablet, TAKE 1 TABLET BY MOUTH 2 TIMES A DAY WITH MORNING AND EVENING MEALS, Disp: 180 tablet, Rfl: 2 .  norethindrone (MICRONOR) 0.35 MG tablet, Take 1 tablet (0.35 mg total) by mouth daily., Disp: 84 tablet, Rfl: 1 .  olmesartan-hydrochlorothiazide (BENICAR HCT) 40-25 MG tablet, TAKE 1 TABLET BY MOUTH DAILY., Disp: 90 tablet, Rfl: 1 .  TRULICITY [id number] SOPN, Inject 0.75 mg as directed once a week., Disp: 12 pen, Rfl: 2 .  valACYclovir (VALTREX) 1000 MG tablet, Take 1 tablet by mouth as needed., Disp: , Rfl: 0 .  Vitamin D, Cholecalciferol, 25 MCG (1000 UT) TABS, Take 1 tablet by mouth daily., Disp: , Rfl:  .  Phentermine-Topiramate 11.25-69 MG CP24, Take 1 tablet by mouth daily. (Patient not taking: Reported on 03/12/2019), Disp: 30 capsule, Rfl: 1 .  Vitamin D,  Ergocalciferol, (DRISDOL) 50000 units CAPS capsule, Take 1 capsule by mouth once a week., Disp: , Rfl:    No Known Allergies   Review of Systems  Constitutional: Negative.   HENT: Negative.   Eyes: Negative.   Respiratory: Negative.   Cardiovascular: Negative.  Negative for chest pain, palpitations and leg swelling.  Gastrointestinal: Negative.   Endocrine: Negative.   Genitourinary: Negative.   Musculoskeletal: Negative.   Skin: Negative.   Neurological: Negative for dizziness and headaches.  Hematological: Negative.   Psychiatric/Behavioral: Negative.      Today's Vitals   03/12/19 1158  BP:  124/82  Pulse: 87  Temp: 98.5 F (36.9 C)  TempSrc: Oral  Weight: 211 lb 3.2 oz (95.8 kg)  PainSc: 0-No pain   Body mass index is 35.8 kg/m.   Objective:  Physical Exam Vitals signs reviewed.  Constitutional:      General: She is not in acute distress.    Appearance: Normal appearance. She is well-developed. She is obese.  HENT:     Head: Normocephalic and atraumatic.     Right Ear: Hearing, tympanic membrane, ear canal and external ear normal.     Left Ear: Hearing, tympanic membrane, ear canal and external ear normal.     Nose: Nose normal.  Eyes:     General: Lids are normal.     Extraocular Movements: Extraocular movements intact.     Conjunctiva/sclera: Conjunctivae normal.     Pupils: Pupils are equal, round, and reactive to light.     Funduscopic exam:    Right eye: No papilledema.        Left eye: No papilledema.  Neck:     Musculoskeletal: Full passive range of motion without pain, normal range of motion and neck supple.     Thyroid: No thyroid mass.     Vascular: No carotid bruit.  Cardiovascular:     Rate and Rhythm: Normal rate and regular rhythm.     Pulses: Normal pulses.     Heart sounds: Normal heart sounds. No murmur.  Pulmonary:     Effort: Pulmonary effort is normal. No respiratory distress.     Breath sounds: Normal breath sounds.  Chest:     Breasts:        Right: Normal. No mass, nipple discharge or tenderness.        Left: Normal. No mass, nipple discharge or tenderness.  Abdominal:     General: Abdomen is flat. Bowel sounds are normal.     Palpations: Abdomen is soft.  Musculoskeletal: Normal range of motion.        General: No swelling.     Right lower leg: No edema.     Left lower leg: No edema.  Lymphadenopathy:     Upper Body:     Right upper body: No supraclavicular or axillary adenopathy.     Left upper body: No supraclavicular or axillary adenopathy.  Skin:    General: Skin is warm and dry.     Capillary Refill: Capillary refill  takes less than 2 seconds.  Neurological:     General: No focal deficit present.     Mental Status: She is alert and oriented to person, place, and time.     Cranial Nerves: No cranial nerve deficit.     Sensory: No sensory deficit.  Psychiatric:        Mood and Affect: Mood normal.        Behavior: Behavior normal.  Thought Content: Thought content normal.        Judgment: Judgment normal.         Assessment And Plan:   1. Health maintenance examination Behavior modifications discussed and diet history reviewed.   Pt will continue to exercise regularly and modify diet with low GI, plant based foods and decrease intake of processed foods.  Recommend intake of daily multivitamin, Vitamin D, and calcium.  Recommend mammogram and colonoscopy (not done yet due to covid pandemic) for preventive screenings, as well as recommend immunizations that include influenza, TDAP  2. Hypertension, unspecified type  Chronic, good control  Continue with current medications  EKG done, NSR HR 80 - POCT Urinalysis Dipstick (81002) - POCT UA - Microalbumin - EKG 12-Lead  3. Pre-diabetes  Stable, urinalysis normal with microalbumin normal  Continue to focus on regular exercise and healthy diet.  4. Class 2 severe obesity due to excess calories with serious comorbidity and body mass index (BMI) of 35.0 to 35.9 in adult [facility])  Chronic, she has not been taking qsymia due to cost  Discussed healthy diet and regular exercise options   Encouraged to exercise at least 150 minutes per week with 2 days of strength training  5. Vitamin D deficiency  Will check vitamin D level and supplement as needed.     Also encouraged to spend 15 minutes in the sun daily.  - Vitamin D (25 hydroxy)   Minette Brine, FNP    THE PATIENT IS ENCOURAGED TO PRACTICE SOCIAL DISTANCING DUE TO THE COVID-19 PANDEMIC.

## 2019-03-12 NOTE — Patient Instructions (Addendum)
Health Maintenance, Female Adopting a healthy lifestyle and getting preventive care are important in promoting health and wellness. Ask your health care provider about:  The right schedule for you to have regular tests and exams.  Things you can do on your own to prevent diseases and keep yourself healthy. What should I know about diet, weight, and exercise? Eat a healthy diet   Eat a diet that includes plenty of vegetables, fruits, low-fat dairy products, and lean protein.  Do not eat a lot of foods that are high in solid fats, added sugars, or sodium. Maintain a healthy weight Body mass index (BMI) is used to identify weight problems. It estimates body fat based on height and weight. Your health care provider can help determine your BMI and help you achieve or maintain a healthy weight. Get regular exercise Get regular exercise. This is one of the most important things you can do for your health. Most adults should:  Exercise for at least 150 minutes each week. The exercise should increase your heart rate and make you sweat (moderate-intensity exercise).  Do strengthening exercises at least twice a week. This is in addition to the moderate-intensity exercise.  Spend less time sitting. Even light physical activity can be beneficial. Watch cholesterol and blood lipids Have your blood tested for lipids and cholesterol at 51 years of age, then have this test every 5 years. Have your cholesterol levels checked more often if:  Your lipid or cholesterol levels are high.  You are older than 51 years of age.  You are at high risk for heart disease. What should I know about cancer screening? Depending on your health history and family history, you may need to have cancer screening at various ages. This may include screening for:  Breast cancer.  Cervical cancer.  Colorectal cancer.  Skin cancer.  Lung cancer. What should I know about heart disease, diabetes, and high blood  pressure? Blood pressure and heart disease  High blood pressure causes heart disease and increases the risk of stroke. This is more likely to develop in people who have high blood pressure readings, are of African descent, or are overweight.  Have your blood pressure checked: ? Every 3-5 years if you are 18-39 years of age. ? Every year if you are 40 years old or older. Diabetes Have regular diabetes screenings. This checks your fasting blood sugar level. Have the screening done:  Once every three years after age 40 if you are at a normal weight and have a low risk for diabetes.  More often and at a younger age if you are overweight or have a high risk for diabetes. What should I know about preventing infection? Hepatitis B If you have a higher risk for hepatitis B, you should be screened for this virus. Talk with your health care provider to find out if you are at risk for hepatitis B infection. Hepatitis C Testing is recommended for:  Everyone born from 1945 through 1965.  Anyone with known risk factors for hepatitis C. Sexually transmitted infections (STIs)  Get screened for STIs, including gonorrhea and chlamydia, if: ? You are sexually active and are younger than 51 years of age. ? You are older than 51 years of age and your health care provider tells you that you are at risk for this type of infection. ? Your sexual activity has changed since you were last screened, and you are at increased risk for chlamydia or gonorrhea. Ask your health care provider if   you are at risk.  Ask your health care provider about whether you are at high risk for HIV. Your health care provider may recommend a prescription medicine to help prevent HIV infection. If you choose to take medicine to prevent HIV, you should first get tested for HIV. You should then be tested every 3 months for as long as you are taking the medicine. Pregnancy  If you are about to stop having your period (premenopausal) and  you may become pregnant, seek counseling before you get pregnant.  Take 400 to 800 micrograms (mcg) of folic acid every day if you become pregnant.  Ask for birth control (contraception) if you want to prevent pregnancy. Osteoporosis and menopause Osteoporosis is a disease in which the bones lose minerals and strength with aging. This can result in bone fractures. If you are 20 years old or older, or if you are at risk for osteoporosis and fractures, ask your health care provider if you should:  Be screened for bone loss.  Take a calcium or vitamin D supplement to lower your risk of fractures.  Be given hormone replacement therapy (HRT) to treat symptoms of menopause. Follow these instructions at home: Lifestyle  Do not use any products that contain nicotine or tobacco, such as cigarettes, e-cigarettes, and chewing tobacco. If you need help quitting, ask your health care provider.  Do not use street drugs.  Do not share needles.  Ask your health care provider for help if you need support or information about quitting drugs. Alcohol use  Do not drink alcohol if: ? Your health care provider tells you not to drink. ? You are pregnant, may be pregnant, or are planning to become pregnant.  If you drink alcohol: ? Limit how much you use to 0-1 drink a day. ? Limit intake if you are breastfeeding.  Be aware of how much alcohol is in your drink. In the [place] one drink equals one 12 oz bottle of beer (355 mL), one 5 oz glass of wine (148 mL), or one 1 oz glass of hard liquor (44 mL). General instructions  Schedule regular health, dental, and eye exams.  Stay current with your vaccines.  Tell your health care provider if: ? You often feel depressed. ? You have ever been abused or do not feel safe at home. Summary  Adopting a healthy lifestyle and getting preventive care are important in promoting health and wellness.  Follow your health care provider's instructions about healthy  diet, exercising, and getting tested or screened for diseases.  Follow your health care provider's instructions on monitoring your cholesterol and blood pressure. This information is not intended to replace advice given to you by your health care provider. Make sure you discuss any questions you have with your health care provider. Document Released: 10/05/2010 Document Revised: 03/15/2018 Document Reviewed: 03/15/2018 Elsevier Patient Education  2020 [place] Maintenance  Topic Date Due  . PNEUMOCOCCAL POLYSACCHARIDE VACCINE AGE 56-64 HIGH RISK  03/05/1970  . COLONOSCOPY  03/05/2018  . HIV Screening  06/10/2019 (Originally 03/06/1983)  . HEMOGLOBIN A1C  05/05/2019  . PAP SMEAR-Modifier  07/15/2019  . OPHTHALMOLOGY EXAM  11/02/2019  . MAMMOGRAM  01/27/2020  . FOOT EXAM  03/11/2020  . TETANUS/TDAP  06/09/2028  . INFLUENZA VACCINE  Completed

## 2019-03-13 ENCOUNTER — Other Ambulatory Visit: Payer: Self-pay | Admitting: Nurse Practitioner

## 2019-03-13 DIAGNOSIS — E559 Vitamin D deficiency, unspecified: Secondary | ICD-10-CM

## 2019-03-13 LAB — LIPID PANEL
Chol/HDL Ratio: 2.7 ratio (ref 0.0–4.4)
Cholesterol, Total: 194 mg/dL (ref 100–199)
HDL: 73 mg/dL (ref >39)
LDL Chol Calc (NIH): 111 mg/dL — ABNORMAL HIGH (ref 0–99)
Triglycerides: 53 mg/dL (ref 0–149)
VLDL Cholesterol Cal: 10 mg/dL (ref 5–40)

## 2019-03-13 LAB — BMP8+EGFR
BUN/Creatinine Ratio: 13 (ref 9–23)
BUN: 10 mg/dL (ref 6–24)
CO2: 23 mmol/L (ref 20–29)
Calcium: 9.4 mg/dL (ref 8.7–10.2)
Chloride: 101 mmol/L (ref 96–106)
Creatinine, Ser: 0.79 mg/dL (ref 0.57–1.00)
GFR calc Af Amer: 100 mL/min/{1.73_m2} (ref >59)
GFR calc non Af Amer: 87 mL/min/{1.73_m2} (ref >59)
Glucose: 90 mg/dL (ref 65–99)
Potassium: 3.4 mmol/L — ABNORMAL LOW (ref 3.5–5.2)
Sodium: 141 mmol/L (ref 134–144)

## 2019-03-13 LAB — VITAMIN D 25 HYDROXY (VIT D DEFICIENCY, FRACTURES): Vit D, 25-Hydroxy: 22.2 ng/mL — ABNORMAL LOW (ref 30.0–100.0)

## 2019-03-13 LAB — HEMOGLOBIN A1C
Est. average glucose Bld gHb Est-mCnc: 120 mg/dL
Hgb A1c MFr Bld: 5.8 % — ABNORMAL HIGH (ref 4.8–5.6)

## 2019-03-13 MED ORDER — VITAMIN D (ERGOCALCIFEROL) 1.25 MG (50000 UNIT) PO CAPS: 50000.0000 [IU] | ORAL_CAPSULE | ORAL | 0 refills | Status: DC

## 2019-03-13 MED FILL — VIT D2 1.25 MG (50,000 UNIT: 1.25 MG | 84 days supply | Qty: 12 | Fill #0

## 2019-04-16 ENCOUNTER — Other Ambulatory Visit: Payer: Self-pay | Admitting: Obstetrics & Gynecology

## 2019-04-16 DIAGNOSIS — Z3041 Encounter for surveillance of contraceptive pills: Secondary | ICD-10-CM

## 2019-04-16 MED FILL — NORETHINDRONE 0.35 MG TAB: 0.35 | 28 days supply | Qty: 28 | Fill #0

## 2019-05-04 ENCOUNTER — Ambulatory Visit: Payer: No Typology Code available for payment source | Admitting: Family Medicine

## 2019-05-09 ENCOUNTER — Other Ambulatory Visit: Payer: Self-pay

## 2019-05-09 ENCOUNTER — Encounter: Payer: Self-pay | Admitting: Internal Medicine

## 2019-05-09 ENCOUNTER — Telehealth (INDEPENDENT_AMBULATORY_CARE_PROVIDER_SITE_OTHER): Payer: No Typology Code available for payment source | Admitting: Internal Medicine

## 2019-05-09 VITALS — Ht 64.4 in | Wt 206.0 lb

## 2019-05-09 DIAGNOSIS — Z6834 Body mass index (BMI) 34.0-34.9, adult: Secondary | ICD-10-CM | POA: Diagnosis not present

## 2019-05-09 DIAGNOSIS — R635 Abnormal weight gain: Secondary | ICD-10-CM | POA: Diagnosis not present

## 2019-05-09 DIAGNOSIS — E6609 Other obesity due to excess calories: Secondary | ICD-10-CM | POA: Diagnosis not present

## 2019-05-09 DIAGNOSIS — J301 Allergic rhinitis due to pollen: Secondary | ICD-10-CM | POA: Diagnosis not present

## 2019-05-09 MED ORDER — CONTRAVE 8-90 MG PO TB12: ORAL_TABLET | ORAL | 1 refills | Status: DC

## 2019-05-09 MED ORDER — MOMETASONE FUROATE 50 MCG/ACT NA SUSP
2.0000 | Freq: Every day | NASAL | 2 refills | Status: DC
Start: 2019-05-09 — End: 2020-07-02

## 2019-05-09 MED FILL — MOMETASONE FUROATE 50 MCG S: 50 | 30 days supply | Qty: 17 | Fill #0

## 2019-05-09 NOTE — Patient Instructions (Signed)
Mometasone nasal spray What is this medicine? MOMETASONE(moe MET a sone) is a corticosteroid. It helps decrease inflammation in your nose. This medicine is used to treat the symptoms of allergies like sneezing, itching, and runny or stuffy nose. This medicine is also used to treat nasal polyps. This medicine may be used for other purposes; ask your health care provider or pharmacist if you have questions. COMMON BRAND NAME(S): Nasonex What should I tell my health care provider before I take this medicine? They need to know if you have any of these conditions:  infection, like tuberculosis, herpes, or fungal infection  recent surgery or injury of nose or sinuses  taking corticosteroids by mouth  an unusual or allergic reaction to mometasone, other corticosteroids, medicines, foods, dyes, or preservatives  pregnant or trying to get pregnant  breast-feeding How should I use this medicine? This medicine is for use in the nose. Follow the directions on your prescription label. Do not use more often than directed. Do not share this medicine with anyone else. Make sure that you are using your nasal spray correctly. Ask you doctor or health care provider if you have any questions. Talk to your pediatrician regarding the use of this medicine in children. While this drug may be prescribed for children as young as 2 years of age for selected conditions, precautions do apply. Overdosage: If you think you have taken too much of this medicine contact a poison control center or emergency room at once. NOTE: This medicine is only for you. Do not share this medicine with others. What if I miss a dose? If you miss a dose, use it as soon as you can. If it is almost time for your next dose, use only that dose and continue with your regular schedule. Do not use double or extra doses. What may interact with this medicine? Interactions are not expected. Check with your doctor before you use any other medicine  for your nose or sinus. This list may not describe all possible interactions. Give your health care provider a list of all the medicines, herbs, non-prescription drugs, or dietary supplements you use. Also tell them if you smoke, drink alcohol, or use illegal drugs. Some items may interact with your medicine. What should I watch for while using this medicine? Visit your doctor for regular check ups. Tell your doctor or healthcare professional if your symptoms do not start to get better or if they get worse. This medicine may increase your risk of getting an infection. Tell your doctor or health care professional if you are around anyone with measles or chickenpox, or if you develop sores or blisters that do not heal properly. What side effects may I notice from receiving this medicine? Side effects that you should report to your doctor or health care professional as soon as possible:  allergic reactions like skin rash, itching or hives, swelling of the face, lips, or tongue  breathing problems  changes in vision  feeling faint or lightheaded, falls  infection  nausea, vomiting  unusually weak or tired  white patches or sores in the mouth or nose Side effects that usually do not require medical attention (report to your doctor or health care professional if they continue or are bothersome):  altered sense of taste or smell  burning or irritation inside the nose or throat  cough  headache  muscle pain  painful menstrual periods  nosebleed This list may not describe all possible side effects. Call your doctor for medical   advice about side effects. You may report side effects to FDA at 1-800-FDA-1088. Where should I keep my medicine? Keep out of the reach of children. Store this medicine at room temperature between 59 and 86 degrees F (15 and 30 degrees C). Protect from light. Throw away any unused medicine after the expiration date. NOTE: This sheet is a summary. It may not  cover all possible information. If you have questions about this medicine, talk to your doctor, pharmacist, or health care provider.  2020 Elsevier/Gold Standard (2016-07-01 08:36:24)  

## 2019-05-09 NOTE — Progress Notes (Signed)
Virtual Visit via Video   This visit type was conducted due to national recommendations for restrictions regarding the COVID-19 Pandemic (e.g. social distancing) in an effort to limit this patient's exposure and mitigate transmission in our community.  Due to her co-morbid illnesses, this patient is at least at moderate risk for complications without adequate follow up.  This format is felt to be most appropriate for this patient at this time.  All issues noted in this document were discussed and addressed.  A limited physical exam was performed with this format.    This visit type was conducted due to national recommendations for restrictions regarding the COVID-19 Pandemic (e.g. social distancing) in an effort to limit this patient's exposure and mitigate transmission in our community.  Patients identity confirmed using two different identifiers.  This format is felt to be most appropriate for this patient at this time.  All issues noted in this document were discussed and addressed.  No physical exam was performed (except for noted visual exam findings with Video Visits).    Date:  05/09/2019   ID:  Mackenzie Clark, DOB [birth year], MRN [id number]  Patient Location:  Home  Provider location:   Office    Chief Complaint:  "I want to lose weight"  History of Present Illness:    Mackenzie Clark is a 54 y.o. female who presents via video conferencing for a telehealth visit today.    The patient does not have symptoms concerning for COVID-19 infection (fever, chills, cough, or new shortness of breath).   She presents today for virtual visit. She prefers this method of contact due to COVID-19 pandemic.  She presents today to discuss options for rx meds for weight loss.  She has used Contrave in the past. She wants to resume this - she initially thought it was not effective, but realized she may not have given it a chance. She did not have any side effects.     Past Medical History:    Diagnosis Date  . Allergy   . Diabetes mellitus without complication (HCC)    diet and exercise controlled  . Hypertension    Past Surgical History:  Procedure Laterality Date  . DILATION AND CURETTAGE OF UTERUS  2006   16 week stillbirth     Current Meds  Medication Sig  . cetirizine (ZYRTEC ALLERGY) 10 MG tablet Take 1 tablet (10 mg total) by mouth daily.  . Dulaglutide (TRULICITY) 1.5 0000000 SOPN Inject 1.5 mg into the skin once a week.  Marland Kitchen glucose blood (ACCU-CHEK AVIVA PLUS) test strip 1 each by Other route 2 (two) times daily. Use as instructed  . metFORMIN (GLUCOPHAGE) 500 MG tablet Take 1 tablet (500 mg total) by mouth 2 (two) times daily with a meal.  . Naltrexone-buPROPion HCl ER (CONTRAVE) 8-90 MG TB12 One tab po qd x 7 days, then one tab po bid x 7 days, then 2 tabs po qam and 1 tab po qpm, then 2 tab po bid  . norethindrone (MICRONOR) 0.35 MG tablet TAKE 1 TABLET BY MOUTH DAILY  . olmesartan-hydrochlorothiazide (BENICAR HCT) 40-25 MG tablet Take 1 tablet by mouth daily.  . valACYclovir (VALTREX) 1000 MG tablet Take 1 tablet (1,000 mg total) by mouth as needed.  . Vitamin D, Ergocalciferol, (DRISDOL) 1.25 MG (50000 UT) CAPS capsule Take 1 capsule (50,000 Units total) by mouth once a week.  . [DISCONTINUED] Naltrexone-buPROPion HCl ER (CONTRAVE) 8-90 MG TB12 Take by mouth. 2 tablets daily  Allergies:   Patient has no known allergies.   Social History   Tobacco Use  . Smoking status: Never Smoker  . Smokeless tobacco: Never Used  Substance Use Topics  . Alcohol use: Yes    Comment: occasionally  . Drug use: No     Family Hx: The patient's family history includes Cancer (age of onset: 50) in her maternal grandmother; Diabetes in her father; Heart disease in her father; Hyperlipidemia in her mother; Hypertension in her brother, father, and mother; Obesity in an other family member.  ROS:   Please see the history of present illness.    Review of Systems   Constitutional: Negative.   Respiratory: Negative.   Cardiovascular: Negative.   Gastrointestinal: Negative.   Neurological: Negative.   Psychiatric/Behavioral: Negative.     All other systems reviewed and are negative.   Labs/Other Tests and Data Reviewed:    Recent Labs: 03/12/2019: BUN 10; Creatinine, Ser 0.79; Potassium 3.4; Sodium 141   Recent Lipid Panel Lab Results  Component Value Date/Time   CHOL 194 03/12/2019 12:35 PM   TRIG 53 03/12/2019 12:35 PM   HDL 73 03/12/2019 12:35 PM   CHOLHDL 2.7 03/12/2019 12:35 PM   CHOLHDL 2.7 03/05/2013 02:28 PM   LDLCALC 111 (H) 03/12/2019 12:35 PM    Wt Readings from Last 3 Encounters:  05/09/19 206 lb (93.4 kg)  03/12/19 211 lb 3.2 oz (95.8 kg)  11/02/18 200 lb 3.2 oz (90.8 kg)     Exam:    Vital Signs:  Ht 5' 4.4" (1.636 m)   Wt 206 lb (93.4 kg) Comment: pt provided  LMP 04/08/2019   BMI 34.92 kg/m     Physical Exam  Constitutional: She is oriented to person, place, and time and well-developed, well-nourished, and in no distress.  HENT:  Head: Normocephalic and atraumatic.  Pulmonary/Chest: Effort normal.  Musculoskeletal:     Cervical back: Normal range of motion.  Neurological: She is alert and oriented to person, place, and time.  Psychiatric: Affect normal.  Nursing note and vitals reviewed.   ASSESSMENT & PLAN:     1. Weight gain  Wt Readings from Last 3 Encounters:  05/09/19 206 lb (93.4 kg)  03/12/19 211 lb 3.2 oz (95.8 kg)  11/02/18 200 lb 3.2 oz (90.8 kg)   I will send rx Contrave to the pharmacy. She has tolerated this in the past. Dosing schedule was reviewed with the patient. She will start with one daily x 7 days, then one tab twice daily x 7 days, then 2 tabs po qam and 1 tab po qpm x 7 days, and then 2 tabs po twice daily. She is reminded that she cannot take opiate pain pills while on this medication. She will rto in 6 weeks for re-evaluation.    2. Seasonal allergic rhinitis due to  pollen  Chronic. Flonase is no longer effective. She would like to try a new nasal spray. I will send rx Nasonex  3. Class 1 obesity due to excess calories with serious comorbidity and body mass index (BMI) of 34.0 to 34.9 in adult  She was congratulated on her 5 pound weight loss since Christmas. She is encouraged to exercise no less than 30 minutes five days per week and to avoid sugary beverages and packaged foods.     COVID-19 Education: The signs and symptoms of COVID-19 were discussed with the patient and how to seek care for testing (follow up with PCP or arrange E-visit).  The  importance of social distancing was discussed today.  Patient Risk:   After full review of this patients clinical status, I feel that they are at least moderate risk at this time.   Medication Adjustments/Labs and Tests Ordered: Current medicines are reviewed at length with the patient today.  Concerns regarding medicines are outlined above.   Tests Ordered: No orders of the defined types were placed in this encounter.   Medication Changes: Meds ordered this encounter  Medications  . Naltrexone-buPROPion HCl ER (CONTRAVE) 8-90 MG TB12    Sig: One tab po qd x 7 days, then one tab po bid x 7 days, then 2 tabs po qam and 1 tab po qpm, then 2 tab po bid    Dispense:  70 tablet    Refill:  1  . mometasone (NASONEX) 50 MCG/ACT nasal spray    Sig: Place 2 sprays into the nose daily.    Dispense:  17 g    Refill:  2    Disposition:  Follow up prn  Signed, [name], MD

## 2019-05-11 ENCOUNTER — Other Ambulatory Visit: Payer: Self-pay | Admitting: Obstetrics and Gynecology

## 2019-05-11 DIAGNOSIS — Z3041 Encounter for surveillance of contraceptive pills: Secondary | ICD-10-CM

## 2019-05-14 ENCOUNTER — Encounter: Payer: Self-pay | Admitting: Internal Medicine

## 2019-05-14 MED FILL — NORETHINDRONE 0.35 MG TAB: 0.35 | 28 days supply | Qty: 28 | Fill #0

## 2019-05-15 MED FILL — CONTRAVE ER 8-90 MG TABLET: 8-90 | 28 days supply | Qty: 70 | Fill #0

## 2019-05-21 ENCOUNTER — Other Ambulatory Visit: Payer: Self-pay

## 2019-05-21 ENCOUNTER — Ambulatory Visit (INDEPENDENT_AMBULATORY_CARE_PROVIDER_SITE_OTHER): Payer: No Typology Code available for payment source | Admitting: Family Medicine

## 2019-05-21 ENCOUNTER — Encounter: Payer: Self-pay | Admitting: Family Medicine

## 2019-05-21 VITALS — BP 137/95 | HR 94 | Wt 205.5 lb

## 2019-05-21 DIAGNOSIS — Z1151 Encounter for screening for human papillomavirus (HPV): Secondary | ICD-10-CM | POA: Diagnosis not present

## 2019-05-21 DIAGNOSIS — Z124 Encounter for screening for malignant neoplasm of cervix: Secondary | ICD-10-CM

## 2019-05-21 DIAGNOSIS — D259 Leiomyoma of uterus, unspecified: Secondary | ICD-10-CM | POA: Insufficient documentation

## 2019-05-21 DIAGNOSIS — E6609 Other obesity due to excess calories: Secondary | ICD-10-CM

## 2019-05-21 DIAGNOSIS — Z01419 Encounter for gynecological examination (general) (routine) without abnormal findings: Secondary | ICD-10-CM

## 2019-05-21 DIAGNOSIS — I10 Essential (primary) hypertension: Secondary | ICD-10-CM

## 2019-05-21 DIAGNOSIS — E1165 Type 2 diabetes mellitus with hyperglycemia: Secondary | ICD-10-CM

## 2019-05-21 DIAGNOSIS — Z3041 Encounter for surveillance of contraceptive pills: Secondary | ICD-10-CM

## 2019-05-21 DIAGNOSIS — N939 Abnormal uterine and vaginal bleeding, unspecified: Secondary | ICD-10-CM

## 2019-05-21 MED ORDER — MISOPROSTOL 200 MCG PO TABS: 200.0000 ug | ORAL_TABLET | ORAL | 0 refills | Status: DC

## 2019-05-21 MED ORDER — MISOPROSTOL 200 MCG PO TABS: 200.0000 ug | ORAL_TABLET | Freq: Four times a day (QID) | ORAL | 0 refills | Status: DC

## 2019-05-21 MED ORDER — NORETHINDRONE 0.35 MG PO TABS: 1.0000 | ORAL_TABLET | Freq: Every day | ORAL | 3 refills | Status: DC

## 2019-05-21 MED FILL — miSOPROStol 200 MCG TABS: 200 | 3 days supply | Qty: 3 | Fill #0

## 2019-05-21 NOTE — Assessment & Plan Note (Signed)
Last A1c is 5.8.

## 2019-05-21 NOTE — Assessment & Plan Note (Signed)
Increasing in size--check more recent u/s--IR referral for UFE counseling.

## 2019-05-21 NOTE — Assessment & Plan Note (Addendum)
Likely related to fibroids, and given age--needs sampling--last attempt unsuccessful, though might be related to anterior fibroid--will attempt again in office with cytotec. Options offered, include IUD, endometrial ablation with BTL, hysterectomy though given size, might have to be an abd hyst---though robotic would be an option with another Psychologist, sport and exercise. Risks reviewed. She is interested in UFE--Last u/s showed 12 wk size uterus, much larger now.

## 2019-05-21 NOTE — Progress Notes (Signed)
  Subjective:     Mackenzie Clark is a 54 y.o. female and is here for a comprehensive physical exam. The patient reports heavier cycles. Notes she can feel her cycle coming and has pain on the left side. Has known fibroids. She reports painful intercourse. She also reports urinary frequency. On POPs for contraception due to BP.  The following portions of the patient's history were reviewed and updated as appropriate: allergies, current medications, past family history, past medical history, past social history, past surgical history and problem list.  Review of Systems Pertinent items noted in HPI and remainder of comprehensive ROS otherwise negative.   Objective:    BP (!) 137/95   Pulse 94   Wt 205 lb 8 oz (93.2 kg)   LMP 04/18/2019 (Approximate)   BMI 34.84 kg/m  General appearance: alert, cooperative, appears stated age and mildly obese Head: Normocephalic, without obvious abnormality, atraumatic Neck: no adenopathy, supple, symmetrical, trachea midline and thyroid not enlarged, symmetric, no tenderness/mass/nodules Lungs: clear to auscultation bilaterally Heart: regular rate and rhythm, S1, S2 normal, no murmur, click, rub or gallop Abdomen: soft, non-tender; bowel sounds normal; no masses,  no organomegaly Pelvic: cervix normal in appearance, external genitalia normal, no adnexal masses or tenderness, no cervical motion tenderness, vagina normal without discharge and uterus is enlarged 16-18 wk size, firm, nodular, anterior fibroid felt and noted to increase the width of her uterus bilaterally Extremities: extremities normal, atraumatic, no cyanosis or edema Skin: Skin color, texture, turgor normal. No rashes or lesions Neurologic: Grossly normal    Assessment:    Healthy female exam.      Plan:   Problem List Items Addressed This Visit      Unprioritized   Hypertension    BP is well controlled today      Surveillance of previously prescribed contraceptive pill   Relevant Medications   norethindrone (MICRONOR) 0.35 MG tablet   Type II diabetes mellitus, uncontrolled (HCC)    Last A1c is 5.8.      Obesity   Abnormal uterine bleeding (AUB)    Likely related to fibroids, and given age--needs sampling--last attempt unsuccessful, though might be related to anterior fibroid--will attempt again in office with cytotec. Options offered, include IUD, endometrial ablation with BTL, hysterectomy though given size, might have to be an abd hyst---though robotic would be an option with another Psychologist, sport and exercise. Risks reviewed. She is interested in UFE--Last u/s showed 12 wk size uterus, much larger now.      Relevant Medications   misoprostol (CYTOTEC) 200 MCG tablet   Other Relevant Orders   US PELVIC COMPLETE WITH TRANSVAGINAL   Uterine leiomyoma    Increasing in size--check more recent u/s--IR referral for UFE counseling.      Relevant Orders   Ambulatory referral to Interventional Radiology    Other Visit Diagnoses    Well woman exam with routine gynecological exam    -  Primary   Relevant Orders   Cytology - PAP( [facility])   Screening for malignant neoplasm of cervix       Encounter for gynecological examination without abnormal finding         Return in 4 weeks (on 06/18/2019) for endometrial biopsy, needs U/S, in person.    See After Visit Summary for Counseling Recommendations

## 2019-05-21 NOTE — Patient Instructions (Signed)
Abnormal Uterine Bleeding °Abnormal uterine bleeding is unusual bleeding from the uterus. It includes: °· Bleeding or spotting between periods. °· Bleeding after sex. °· Bleeding that is heavier than normal. °· Periods that last longer than usual. °· Bleeding after menopause. °Abnormal uterine bleeding can affect women at various stages in life, including teenagers, women in their reproductive years, pregnant women, and women who have reached menopause. Common causes of abnormal uterine bleeding include: °· Pregnancy. °· Growths of tissue (polyps). °· A noncancerous tumor in the uterus (fibroid). °· Infection. °· Cancer. °· Hormonal imbalances. °Any type of abnormal bleeding should be evaluated by a health care provider. Many cases are minor and simple to treat, while others are more serious. Treatment will depend on the cause of the bleeding. °Follow these instructions at home: °· Monitor your condition for any changes. °· Do not use tampons, douche, or have sex if told by your health care provider. °· Change your pads often. °· Get regular exams that include pelvic exams and cervical cancer screening. °· Keep all follow-up visits as told by your health care provider. This is important. °Contact a health care provider if: °· Your bleeding lasts for more than one week. °· You feel dizzy at times. °· You feel nauseous or you vomit. °Get help right away if: °· You pass out. °· Your bleeding soaks through a pad every hour. °· You have abdominal pain. °· You have a fever. °· You become sweaty or weak. °· You pass large blood clots from your vagina. °Summary °· Abnormal uterine bleeding is unusual bleeding from the uterus. °· Any type of abnormal bleeding should be evaluated by a health care provider. Many cases are minor and simple to treat, while others are more serious. °· Treatment will depend on the cause of the bleeding. °This information is not intended to replace advice given to you by your health care provider.  Make sure you discuss any questions you have with your health care provider. °Document Revised: 06/29/2017 Document Reviewed: 04/23/2016 °Elsevier Patient Education © 2020 Elsevier Inc. ° °

## 2019-05-21 NOTE — Assessment & Plan Note (Signed)
BP is well controlled today

## 2019-05-25 LAB — CYTOLOGY - PAP
Comment: NEGATIVE
Comment: NEGATIVE
Diagnosis: NEGATIVE
HPV 16: NEGATIVE
HPV 18 / 45: NEGATIVE
High risk HPV: POSITIVE — AB

## 2019-05-28 ENCOUNTER — Encounter: Payer: Self-pay | Admitting: Family Medicine

## 2019-06-05 MED FILL — NORETHINDRONE 0.35 MG TABS: 0.35 | 84 days supply | Qty: 84 | Fill #0

## 2019-06-06 ENCOUNTER — Other Ambulatory Visit: Payer: Self-pay

## 2019-06-06 ENCOUNTER — Ambulatory Visit (HOSPITAL_COMMUNITY)
Admission: RE | Admit: 2019-06-06 | Discharge: 2019-06-06 | Disposition: A | Discharge status: Home / Self Care | Payer: No Typology Code available for payment source | Source: Ambulatory Visit | Attending: Family Medicine | Admitting: Family Medicine

## 2019-06-06 DIAGNOSIS — N939 Abnormal uterine and vaginal bleeding, unspecified: Secondary | ICD-10-CM | POA: Insufficient documentation

## 2019-06-06 MED FILL — metFORMIN HCL 500 MG TABS: 500 | 90 days supply | Qty: 180 | Fill #2

## 2019-06-06 MED FILL — OLMESARTAN-HCTZ 40-25 MG TA: 40-25 | 90 days supply | Qty: 90 | Fill #1

## 2019-06-11 ENCOUNTER — Ambulatory Visit: Payer: No Typology Code available for payment source | Admitting: Internal Medicine

## 2019-06-11 MED FILL — CONTRAVE ER 8-90 MG TABLET: 8-90 | 28 days supply | Qty: 70 | Fill #1

## 2019-06-25 ENCOUNTER — Other Ambulatory Visit: Payer: Self-pay

## 2019-06-25 ENCOUNTER — Encounter: Payer: Self-pay | Admitting: Family Medicine

## 2019-06-25 ENCOUNTER — Ambulatory Visit (INDEPENDENT_AMBULATORY_CARE_PROVIDER_SITE_OTHER): Payer: No Typology Code available for payment source | Admitting: Family Medicine

## 2019-06-25 ENCOUNTER — Other Ambulatory Visit (HOSPITAL_COMMUNITY)
Admission: RE | Admit: 2019-06-25 | Discharge: 2019-06-25 | Disposition: A | Discharge status: Home / Self Care | Payer: No Typology Code available for payment source | Source: Ambulatory Visit | Attending: Family Medicine | Admitting: Family Medicine

## 2019-06-25 VITALS — BP 141/94 | HR 90 | Wt 205.0 lb

## 2019-06-25 DIAGNOSIS — N939 Abnormal uterine and vaginal bleeding, unspecified: Secondary | ICD-10-CM | POA: Insufficient documentation

## 2019-06-25 DIAGNOSIS — D259 Leiomyoma of uterus, unspecified: Secondary | ICD-10-CM | POA: Diagnosis not present

## 2019-06-25 LAB — POCT PREGNANCY, URINE: Preg Test, Ur: NEGATIVE

## 2019-06-25 NOTE — Assessment & Plan Note (Signed)
Desires hyst. Risks include but are not limited to bleeding, infection, injury to surrounding structures, including bowel, bladder and ureters, blood clots, and death.  Likelihood of success is high.

## 2019-06-25 NOTE — Progress Notes (Signed)
   Subjective:    Patient ID: Mackenzie Clark is a 54 y.o. female presenting with endometrial biopsy  on 06/25/2019  HPI: Here today for f/u. Has h/o fibroids and abnormal bleeding. U/s measures 14 cm on u/s. Has anterior fibroid. Needs endometrial sampling today. Discussed previously UFE, but she is no longer interested in this. H/o SVD x 2. Desires hysterectomy. She does not want any minimally invasive options including IUD or endometrial ablation are declined by pt.   Review of Systems  Constitutional: Negative for chills and fever.  Respiratory: Negative for shortness of breath.   Cardiovascular: Negative for chest pain.  Gastrointestinal: Negative for abdominal pain, nausea and vomiting.  Genitourinary: Negative for dysuria.  Skin: Negative for rash.      Objective:    BP (!) 141/94   Pulse 90   Wt 205 lb (93 kg)   LMP 05/21/2019   BMI 34.75 kg/m  Physical Exam Constitutional:      General: She is not in acute distress.    Appearance: She is well-developed.  HENT:     Head: Normocephalic and atraumatic.  Eyes:     General: No scleral icterus. Cardiovascular:     Rate and Rhythm: Normal rate.  Pulmonary:     Effort: Pulmonary effort is normal.  Abdominal:     Palpations: Abdomen is soft.  Genitourinary:    Comments: BUS normal, vagina is pink and rugated, cervix is parous without lesion, uterus is 14 wk size, firm, nodular, especially anteriorly, no adnexal mass or tenderness.  Musculoskeletal:     Cervical back: Neck supple.  Skin:    General: Skin is warm and dry.  Neurological:     Mental Status: She is alert and oriented to person, place, and time.   Patient given informed consent, signed copy in the chart, time out was performed. Appropriate time out taken. . The patient was placed in the lithotomy position and the cervix brought into view with sterile speculum.   A tenaculum was placed in the anterior lip of the cervix. Attempt mde to pass Pipelle, and  encountered fibroid. Os finder used and cervix dilated. Pipelle could not pass. Changed tenaculum to posterior lip of cervix and passed pipelle. The uterus was sounded for depth of 8 cm. A pipelle was introduced to into the uterus, suction created,  and an endometrial sample was obtained. All equipment was removed and accounted for.  The patient tolerated the procedure well.      Assessment & Plan:   Problem List Items Addressed This Visit      Unprioritized   Abnormal uterine bleeding (AUB) - Primary    Will move to [facility] given hist. Has large anterior fibroid which may make anterior entry more difficult.      Relevant Orders   Pregnancy, urine POC (Completed)   Surgical pathology   Uterine leiomyoma    Desires hyst. Risks include but are not limited to bleeding, infection, injury to surrounding structures, including bowel, bladder and ureters, blood clots, and death.  Likelihood of success is high.          Total time: 30 minutes.  Return in about 3 months (around 09/25/2019) for postop check.  Mackenzie Clark 06/25/2019 8:51 AM

## 2019-06-25 NOTE — Progress Notes (Signed)
sug

## 2019-06-25 NOTE — Assessment & Plan Note (Signed)
Will move to [facility] given hist. Has large anterior fibroid which may make anterior entry more difficult.

## 2019-06-25 NOTE — Patient Instructions (Signed)
Hysterectomy Information  A hysterectomy is a surgery in which the uterus is removed. The fallopian tubes and ovaries may be removed (bilateral salpingo-oophorectomy) as well. This procedure may be done to treat various medical problems. After the procedure, a woman will no longer have menstrual periods nor will she be able to become pregnant (sterile). What are the reasons for a hysterectomy? There are many reasons why a woman might have this procedure. They include:  Persistent, abnormal vaginal bleeding.  Long-term (chronic) pelvic pain or infection.  Endometriosis. This is when the lining of the uterus (endometrium) starts to grow outside the uterus.  Adenomyosis. This is when the endometrium starts to grow in the muscle of the uterus.  Pelvic organ prolapse. This is a condition in which the uterus falls down into the vagina.  Noncancerous growths in the uterus (uterine fibroids) that cause symptoms.  The presence of precancerous cells.  Cervical or uterine cancer. What are the different types of hysterectomy? There are three different types of hysterectomy:  Supracervical hysterectomy. In this type, the top part of the uterus is removed, but not the cervix.  Total hysterectomy. In this type, the uterus and cervix are removed.  Radical hysterectomy. In this type, the uterus, the cervix, and the tissue that holds the uterus in place (parametrium) are removed. What are the different ways a hysterectomy can be performed? There are many different ways a hysterectomy can be performed, including:  Abdominal hysterectomy. In this type, an incision is made in the abdomen. The uterus is removed through this incision.  Vaginal hysterectomy. In this type, an incision is made in the vagina. The uterus is removed through this incision. There are no abdominal incisions.  Conventional laparoscopic hysterectomy. In this type, three or four small incisions are made in the abdomen. A thin,  lighted tube with a camera (laparoscope) is inserted into one of the incisions. Other tools are put through the other incisions. The uterus is cut into small pieces. The small pieces are removed through the incisions or through the vagina.  Laparoscopically assisted vaginal hysterectomy (LAVH). In this type, three or four small incisions are made in the abdomen. Part of the surgery is performed laparoscopically and the other part is done vaginally. The uterus is removed through the vagina.  Robot-assisted laparoscopic hysterectomy. In this type, a laparoscope and other tools are inserted into three or four small incisions in the abdomen. A computer-controlled device is used to give the surgeon a 3D image and to help control the surgical instruments. This allows for more precise movements of surgical instruments. The uterus is cut into small pieces and removed through the incisions or removed through the vagina. Discuss the options with your health care provider to determine which type is the right one for you. What are the risks? Generally, this is a safe procedure. However, problems may occur, including:  Bleeding and risk of blood transfusion. Tell your health care provider if you do not want to receive any blood products.  Blood clots in the legs or lung.  Infection.  Damage to other structures or organs.  Allergic reactions to medicines.  Changing to an abdominal hysterectomy from one of the other techniques. What to expect after a hysterectomy  You will be given pain medicine.  You may need to stay in the hospital for 1- 2 days to recover, depending on the type of hysterectomy you had.  Follow your health care provider's instructions about exercise, driving, and general activities. Ask your   health care provider what activities are safe for you.  You will need to have someone with you for the first 3-5 days after you go home.  You will need to follow up with your surgeon in 2-4  weeks after surgery to evaluate your progress.  If the ovaries are removed, you will have early menopause symptoms such as hot flashes, night sweats, and insomnia.  If you had a hysterectomy for a problem that was not cancer or not a condition that could lead to cancer, then you no longer need Pap tests. However, even if you no longer need a Pap test, a regular pelvic exam is a good idea to make sure no other problems are developing. Questions to ask your health care provider  Is a hysterectomy medically necessary? Do I have other treatment options for my condition?  What are my options for hysterectomy procedure?  What organs and tissues need to be removed?  What are the risks?  What are the benefits?  How long will I need to stay in the hospital after the procedure?  How long will I need to recover at home?  What symptoms can I expect after the procedure? Summary  A hysterectomy is a surgery in which the uterus is removed. The fallopian tubes and ovaries may be removed (bilateral salpingo-oophorectomy) as well.  This procedure may be done to treat various medical problems. After the procedure, a woman will no longer have menstrual periods nor will she be able to become pregnant.  Discuss the options with your health care provider to determine which type of hysterectomy is the right one for you. This information is not intended to replace advice given to you by your health care provider. Make sure you discuss any questions you have with your health care provider. Document Revised: 03/04/2017 Document Reviewed: 04/28/2016 Elsevier Patient Education  2020 Elsevier Inc.  

## 2019-06-26 LAB — SURGICAL PATHOLOGY

## 2019-06-27 ENCOUNTER — Other Ambulatory Visit: Payer: Self-pay

## 2019-06-27 ENCOUNTER — Ambulatory Visit (INDEPENDENT_AMBULATORY_CARE_PROVIDER_SITE_OTHER): Payer: No Typology Code available for payment source | Admitting: Internal Medicine

## 2019-06-27 ENCOUNTER — Encounter: Payer: Self-pay | Admitting: Internal Medicine

## 2019-06-27 ENCOUNTER — Other Ambulatory Visit: Payer: Self-pay | Admitting: Internal Medicine

## 2019-06-27 VITALS — BP 136/90 | HR 89 | Temp 98.3°F | Ht 64.4 in | Wt 205.0 lb

## 2019-06-27 DIAGNOSIS — I10 Essential (primary) hypertension: Secondary | ICD-10-CM | POA: Diagnosis not present

## 2019-06-27 DIAGNOSIS — Z6834 Body mass index (BMI) 34.0-34.9, adult: Secondary | ICD-10-CM

## 2019-06-27 DIAGNOSIS — E756 Lipid storage disorder, unspecified: Secondary | ICD-10-CM | POA: Diagnosis not present

## 2019-06-27 DIAGNOSIS — E1169 Type 2 diabetes mellitus with other specified complication: Secondary | ICD-10-CM | POA: Diagnosis not present

## 2019-06-27 DIAGNOSIS — Z23 Encounter for immunization: Secondary | ICD-10-CM

## 2019-06-27 DIAGNOSIS — Z1211 Encounter for screening for malignant neoplasm of colon: Secondary | ICD-10-CM

## 2019-06-27 DIAGNOSIS — E6609 Other obesity due to excess calories: Secondary | ICD-10-CM | POA: Diagnosis not present

## 2019-06-27 MED ORDER — CONTRAVE 8-90 MG PO TB12: ORAL_TABLET | ORAL | 5 refills | Status: DC

## 2019-06-27 NOTE — Patient Instructions (Signed)
Exercising to Stay Healthy To become healthy and stay healthy, it is recommended that you do moderate-intensity and vigorous-intensity exercise. You can tell that you are exercising at a moderate intensity if your heart starts beating faster and you start breathing faster but can still hold a conversation. You can tell that you are exercising at a vigorous intensity if you are breathing much harder and faster and cannot hold a conversation while exercising. Exercising regularly is important. It has many health benefits, such as:  Improving overall fitness, flexibility, and endurance.  Increasing bone density.  Helping with weight control.  Decreasing body fat.  Increasing muscle strength.  Reducing stress and tension.  Improving overall health. How often should I exercise? Choose an activity that you enjoy, and set realistic goals. Your health care provider can help you make an activity plan that works for you. Exercise regularly as told by your health care provider. This may include:  Doing strength training two times a week, such as: ? Lifting weights. ? Using resistance bands. ? Push-ups. ? Sit-ups. ? Yoga.  Doing a certain intensity of exercise for a given amount of time. Choose from these options: ? A total of 150 minutes of moderate-intensity exercise every week. ? A total of 75 minutes of vigorous-intensity exercise every week. ? A mix of moderate-intensity and vigorous-intensity exercise every week. Children, pregnant women, people who have not exercised regularly, people who are overweight, and older adults may need to talk with a health care provider about what activities are safe to do. If you have a medical condition, be sure to talk with your health care provider before you start a new exercise program. What are some exercise ideas? Moderate-intensity exercise ideas include:  Walking 1 mile (1.6 km) in about 15  minutes.  Biking.  Hiking.  Golfing.  Dancing.  Water aerobics. Vigorous-intensity exercise ideas include:  Walking 4.5 miles (7.2 km) or more in about 1 hour.  Jogging or running 5 miles (8 km) in about 1 hour.  Biking 10 miles (16.1 km) or more in about 1 hour.  Lap swimming.  Roller-skating or in-line skating.  Cross-country skiing.  Vigorous competitive sports, such as football, basketball, and soccer.  Jumping rope.  Aerobic dancing. What are some everyday activities that can help me to get exercise?  Yard work, such as: ? Pushing a lawn mower. ? Raking and bagging leaves.  Washing your car.  Pushing a stroller.  Shoveling snow.  Gardening.  Washing windows or floors. How can I be more active in my day-to-day activities?  Use stairs instead of an elevator.  Take a walk during your lunch break.  If you drive, park your car farther away from your work or school.  If you take public transportation, get off one stop early and walk the rest of the way.  Stand up or walk around during all of your indoor phone calls.  Get up, stretch, and walk around every 30 minutes throughout the day.  Enjoy exercise with a friend. Support to continue exercising will help you keep a regular routine of activity. What guidelines can I follow while exercising?  Before you start a new exercise program, talk with your health care provider.  Do not exercise so much that you hurt yourself, feel dizzy, or get very short of breath.  Wear comfortable clothes and wear shoes with good support.  Drink plenty of water while you exercise to prevent dehydration or heat stroke.  Work out until your breathing   and your heartbeat get faster. Where to find more information  [place] Department of Health and Human Services: www.hhs.gov  Centers for Disease Control and Prevention (CDC): www.cdc.gov Summary  Exercising regularly is important. It will improve your overall fitness,  flexibility, and endurance.  Regular exercise also will improve your overall health. It can help you control your weight, reduce stress, and improve your bone density.  Do not exercise so much that you hurt yourself, feel dizzy, or get very short of breath.  Before you start a new exercise program, talk with your health care provider. This information is not intended to replace advice given to you by your health care provider. Make sure you discuss any questions you have with your health care provider. Document Revised: 03/04/2017 Document Reviewed: 02/10/2017 Elsevier Patient Education  2020 Elsevier Inc.  

## 2019-06-27 NOTE — Progress Notes (Signed)
This visit occurred during the SARS-CoV-2 public health emergency.  Safety protocols were in place, including screening questions prior to the visit, additional usage of staff PPE, and extensive cleaning of exam room while observing appropriate contact time as indicated for disinfecting solutions.  Subjective:     Patient ID: Mackenzie Clark , female    DOB: 04-Nov-1965 , 53 y.o.   MRN: [id number]   Chief Complaint  Patient presents with  . Hypertension  . Diabetes  . Obesity    HPI  She presents today for BP/DM check. She reports compliance with meds. She adds that she is scheduled for partial hysterectomy late May 2021. This is being performed to address DUB secondary to uterine fibroids.   Hypertension This is a chronic problem. The current episode started more than 1 year ago. The problem has been gradually improving since onset. The problem is controlled. Pertinent negatives include no blurred vision or chest pain. Risk factors for coronary artery disease include obesity, diabetes mellitus and dyslipidemia. The current treatment provides moderate improvement.  Diabetes She presents for her follow-up diabetic visit. She has type 2 diabetes mellitus. There are no hypoglycemic associated symptoms. Pertinent negatives for diabetes include no blurred vision and no chest pain. There are no hypoglycemic complications.     Past Medical History:  Diagnosis Date  . Allergy   . Diabetes mellitus without complication (HCC)    diet and exercise controlled  . Hypertension      Family History  Problem Relation Age of Onset  . Hypertension Mother   . Hyperlipidemia Mother   . Hypertension Father   . Diabetes Father   . Heart disease Father        heart attack age 80  . Hypertension Brother   . Cancer Maternal Grandmother 36       Breast  . Obesity Other      Current Outpatient Medications:  .  cetirizine (ZYRTEC ALLERGY) 10 MG tablet, Take 1 tablet (10 mg total) by mouth daily.,  Disp: 90 tablet, Rfl: 2 .  Dulaglutide (TRULICITY) 1.5 BT/2.4EL SOPN, Inject 1.5 mg into the skin once a week., Disp: 12 pen, Rfl: 1 .  glucose blood (ACCU-CHEK AVIVA PLUS) test strip, 1 each by Other route 2 (two) times daily. Use as instructed, Disp: , Rfl:  .  metFORMIN (GLUCOPHAGE) 500 MG tablet, Take 1 tablet (500 mg total) by mouth 2 (two) times daily with a meal., Disp: 180 tablet, Rfl: 2 .  mometasone (NASONEX) 50 MCG/ACT nasal spray, Place 2 sprays into the nose daily., Disp: 17 g, Rfl: 2 .  Naltrexone-buPROPion HCl ER (CONTRAVE) 8-90 MG TB12, One tab po qd x 7 days, then one tab po bid x 7 days, then 2 tabs po qam and 1 tab po qpm, then 2 tab po bid, Disp: 70 tablet, Rfl: 1 .  norethindrone (MICRONOR) 0.35 MG tablet, Take 1 tablet (0.35 mg total) by mouth daily., Disp: 84 tablet, Rfl: 3 .  olmesartan-hydrochlorothiazide (BENICAR HCT) 40-25 MG tablet, Take 1 tablet by mouth daily., Disp: 90 tablet, Rfl: 1 .  valACYclovir (VALTREX) 1000 MG tablet, Take 1 tablet (1,000 mg total) by mouth as needed., Disp: 90 tablet, Rfl: 1 .  Vitamin D, Cholecalciferol, 25 MCG (1000 UT) TABS, Take 1 tablet by mouth daily., Disp: , Rfl:  .  Vitamin D, Ergocalciferol, (DRISDOL) 1.25 MG (50000 UT) CAPS capsule, Take 1 capsule (50,000 Units total) by mouth once a week., Disp: 12 capsule, Rfl: 0  No Known Allergies   Review of Systems  Constitutional: Negative.   Eyes: Negative for blurred vision.  Respiratory: Negative.   Cardiovascular: Negative.  Negative for chest pain.  Gastrointestinal: Negative.   Neurological: Negative.   Psychiatric/Behavioral: Negative.      Today's Vitals   06/27/19 1148  BP: 136/90  Pulse: 89  Temp: 98.3 F (36.8 C)  TempSrc: Oral  Weight: 205 lb (93 kg)  Height: 5' 4.4" (1.636 m)   Body mass index is 34.75 kg/m.   Wt Readings from Last 3 Encounters:  06/27/19 205 lb (93 kg)  06/25/19 205 lb (93 kg)  05/21/19 205 lb 8 oz (93.2 kg)     Objective:  Physical  Exam Vitals and nursing note reviewed.  Constitutional:      Appearance: Normal appearance. She is obese.  HENT:     Head: Normocephalic and atraumatic.  Cardiovascular:     Rate and Rhythm: Normal rate and regular rhythm.     Heart sounds: Normal heart sounds.  Pulmonary:     Effort: Pulmonary effort is normal.     Breath sounds: Normal breath sounds.  Skin:    General: Skin is warm.  Neurological:     General: No focal deficit present.     Mental Status: She is alert.  Psychiatric:        Mood and Affect: Mood normal.        Behavior: Behavior normal.         Assessment And Plan:   1. Essential hypertension, benign  Chronic, uncontrolled. She will continue with current meds for now. She is encouraged to avoid adding salt to her foods.   2. Lipidosis due to DM [facility])  She prefers to have her labs drawn at work. She is encouraged to follow dietary recommendations, avoid fried foods, and exercise at least 150 minutes per week.   - BMP8+EGFR; Future - Hemoglobin A1c; Future  3. Class 1 obesity due to excess calories with serious comorbidity and body mass index (BMI) of 34.0 to 34.9 in adult  She is encouraged to strive for BMI less than 30 to decrease cardiac risk. She is encouraged to exercise 30 minutes five days per week. She was also given refill of Contrave. She will continue with current dosing, 2 tabs po twice daily.   4. Immunization due  She was given pneumovax-23 vaccine to update her immunization history.   5. Screen for colon cancer  She agrees to GI referral for CRC screening. This was initially scheduled in 2020, but cancelled due to [place] pandemic.   - Ambulatory referral to Gastroenterology   [name], MD    THE PATIENT IS ENCOURAGED TO PRACTICE SOCIAL DISTANCING DUE TO THE COVID-19 PANDEMIC.

## 2019-06-29 ENCOUNTER — Other Ambulatory Visit: Payer: No Typology Code available for payment source

## 2019-06-29 NOTE — Addendum Note (Signed)
Addended by: Suzzanne Cloud E on: 06/29/2019 08:44 AM   Modules accepted: Orders

## 2019-06-29 NOTE — Addendum Note (Signed)
Addended by: Suzzanne Cloud E on: 06/29/2019 08:43 AM   Modules accepted: Orders

## 2019-07-03 ENCOUNTER — Telehealth: Payer: Self-pay

## 2019-07-03 ENCOUNTER — Other Ambulatory Visit: Payer: Self-pay | Admitting: Family Medicine

## 2019-07-03 DIAGNOSIS — D259 Leiomyoma of uterus, unspecified: Secondary | ICD-10-CM

## 2019-07-03 DIAGNOSIS — D250 Submucous leiomyoma of uterus: Secondary | ICD-10-CM

## 2019-07-03 NOTE — Telephone Encounter (Signed)
Per pt's request IR referral permitted per Dr. Kennon Rounds.  Spoke with Brittney from [facility] about IR referral.  Brittney informed me that she will call the pt to schedule.  Left message for pt that someone from [facility] will contact her about her referral request.    Mel Almond, RN 07/03/19

## 2019-07-09 MED FILL — CONTRAVE ER 8-90 MG TABLET: 8-90 | 30 days supply | Qty: 120 | Fill #0

## 2019-07-10 ENCOUNTER — Other Ambulatory Visit: Payer: Self-pay | Admitting: Interventional Radiology

## 2019-07-10 ENCOUNTER — Ambulatory Visit
Admission: RE | Admit: 2019-07-10 | Discharge: 2019-07-10 | Disposition: A | Discharge status: Home / Self Care | Payer: No Typology Code available for payment source | Source: Ambulatory Visit | Attending: Family Medicine | Admitting: Family Medicine

## 2019-07-10 ENCOUNTER — Other Ambulatory Visit: Payer: Self-pay

## 2019-07-10 ENCOUNTER — Encounter: Payer: Self-pay | Admitting: *Deleted

## 2019-07-10 DIAGNOSIS — D250 Submucous leiomyoma of uterus: Secondary | ICD-10-CM

## 2019-07-10 HISTORY — PX: IR RADIOLOGIST EVAL & MGMT: IMG5224

## 2019-07-10 NOTE — Consult Note (Signed)
Chief Complaint: Symptomatic uterine fibroids   Referring Physician(s): OB/GYN: Donnamae Jude PCP: Dr. Glendale Chard  History of Present Illness: Mackenzie Clark is a 54 y.o. female presenting today to [facility] as a scheduled consultation, kindly referred by Dr. Baird Cancer and Dr. Kennon Rounds, for evaluation of symptomatic uterine fibroids and candidacy for possible uterine fibroid embolization.   Mackenzie Clark joins [place] today by telemedicine visit given COVID.  We confirmed her identity with 2 personal identifiers.    Mackenzie Clark tells me that she learned of her fibroids about 14 years ago.  She never had symptoms until starting about 4 years ago.  Since then she says she has had progressive symptoms, which "seem to be worsening every year."    She endorses symptoms in all 3 of the categories of symptoms: bleeding, bulk symptoms, and pain.  She tells me that she her periods have been worsening over time, now typically occurring every month, lasting 7-8 days with heavy flow requiring multiple tampon/pad changes, and with clotting.  She says that often she notices inter-period bleeding as well, that does not last as long.  She has never required a blood transfusion.    Regarding pain, she says that she experiences severe cramping type pain that precedes her period by about a week, and last for the complete week of her period, resulting in 2 weeks of the month with severe cramping.  This is only partially treated with OTC medication, and she frequently needs to rest during this time.  In addition, she reports painful intercourse.   Regarding bulk type symptoms, she tells me she urinates "like an old lady" meaning that she is up at least 3-4 times per night to empty her bladder, and that she is urinating every hour, sometimes twice per hour during the day, depending on her fluid intake.  Also, she feels that the fibroids are impeding normal bowel movements, as she complains of constipation that was not a  problem 4 or 5 years ago.    She is divorced, with 2 children, 1 who is 62yrs, and another who is 30 yrs.  She is not planning on further pregnancy.   She continues to work.    She does have a formal surgery scheduled for May for hysterectomy.  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    Past Medical History:  Diagnosis Date  . Allergy   . Diabetes mellitus without complication (HCC)    diet and exercise controlled  . Hypertension     Past Surgical History:  Procedure Laterality Date  . DILATION AND CURETTAGE OF UTERUS  2006   16 week stillbirth    Allergies: Patient has no known allergies.  Medications: Prior to Admission medications   Medication Sig Start Date End Date Taking? Authorizing Provider  cetirizine (ZYRTEC ALLERGY) 10 MG tablet Take 1 tablet (10 mg total) by mouth daily. 01/07/18   Glendale Chard, MD  Dulaglutide (TRULICITY) 1.5 0000000 SOPN Inject 1.5 mg into the skin once a week. 03/12/19   Minette Brine, FNP  glucose blood (ACCU-CHEK AVIVA PLUS) test strip 1 each by Other route 2 (two) times daily. Use as instructed 07/22/17   [provider]  metFORMIN (GLUCOPHAGE) 500 MG tablet Take 1 tablet (500 mg total) by mouth 2 (two) times daily with a meal. 03/12/19  Minette Brine, FNP  mometasone (NASONEX) 50 MCG/ACT nasal spray Place 2 sprays into the nose daily. 05/09/19 05/08/20  Glendale Chard, MD  Naltrexone-buPROPion HCl ER (CONTRAVE) 8-90 MG TB12 2 tab po bid 06/27/19   Glendale Chard, MD  norethindrone (MICRONOR) 0.35 MG tablet Take 1 tablet (0.35 mg total) by mouth daily. 05/21/19   Donnamae Jude, MD  olmesartan-hydrochlorothiazide (BENICAR HCT) 40-25 MG tablet Take 1 tablet by mouth daily. 03/12/19   Minette Brine, FNP  valACYclovir (VALTREX) 1000 MG tablet Take 1 tablet (1,000 mg total) by mouth as needed. 03/12/19    Minette Brine, FNP  Vitamin D, Cholecalciferol, 25 MCG (1000 UT) TABS Take 1 tablet by mouth daily.    [provider]  Vitamin D, Ergocalciferol, (DRISDOL) 1.25 MG (50000 UT) CAPS capsule Take 1 capsule (50,000 Units total) by mouth once a week. 03/13/19   Minette Brine, FNP     Family History  Problem Relation Age of Onset  . Hypertension Mother   . Hyperlipidemia Mother   . Hypertension Father   . Diabetes Father   . Heart disease Father        heart attack age 21  . Hypertension Brother   . Cancer Maternal Grandmother 54       Breast  . Obesity Other     Social History   Socioeconomic History  . Marital status: Divorced    Spouse name: Not on file  . Number of children: Not on file  . Years of education: Not on file  . Highest education level: Not on file  Occupational History  . Occupation: CMA    Employer: [employer]  Tobacco Use  . Smoking status: Never Smoker  . Smokeless tobacco: Never Used  Substance and Sexual Activity  . Alcohol use: Yes    Comment: occasionally  . Drug use: No  . Sexual activity: Yes    Partners: Male    Birth control/protection: OCP, Pill  Other Topics Concern  . Not on file  Social History Narrative  . Not on file   Social Determinants of Health   Financial Resource Strain:   . Difficulty of Paying Living Expenses:   Food Insecurity:   . Worried About Charity fundraiser in the Last Year:   . Arboriculturist in the Last Year:   Transportation Needs:   . Film/video editor (Medical):   Marland Kitchen Lack of Transportation (Non-Medical):   Physical Activity:   . Days of Exercise per Week:   . Minutes of Exercise per Session:   Stress:   . Feeling of Stress :   Social Connections:   . Frequency of Communication with Friends and Family:   . Frequency of Social Gatherings with Friends and Family:   . Attends Religious Services:   . Active Member of Clubs or Organizations:   . Attends Theatre manager Meetings:   Marland Kitchen Marital Status:        Review of Systems  Review of Systems: A 12 point ROS discussed and pertinent positives are indicated in the HPI above.  All other systems are negative.  Physical Exam No direct physical exam was performed (except for noted visual exam findings with Video Visits).     Vital Signs: There were no vitals taken for this visit.  Imaging: No results found.  Labs:  CBC: No results for input(s): WBC, HGB, HCT, PLT in the last 8760 hours.  COAGS: No  results for input(s): INR, APTT in the last 8760 hours.  BMP: Recent Labs    11/02/18 1206 03/12/19 1235  NA 143 141  K 3.0* 3.4*  CL 102 101  CO2 23 23  GLUCOSE 91 90  BUN 10 10  CALCIUM 9.6 9.4  CREATININE 0.84 0.79  GFRNONAA 81 87  GFRAA 94 100    LIVER FUNCTION TESTS: No results for input(s): BILITOT, AST, ALT, ALKPHOS, PROT, ALBUMIN in the last 8760 hours.  TUMOR MARKERS: No results for input(s): AFPTM, CEA, CA199, CHROMGRNA in the last 8760 hours.  Assessment and Plan:  Mackenzie Clark is a 54 year old female with symptomatic uterine fibroids, all 3 categories of symptoms, certainly severe enough to be life-style limiting.    I had a lengthy discussion with her regarding not only her medical history, but also the pathology/pathophysiology, natural history, and possible treatment options for uterine fibroids.  Treatments that were mentioned included medical therapy, formal surgery, high-intensity focused [place] (HIFU), and uterine artery embolization (UAE/UFE).    Regarding UFE, we had a discussion regarding the efficacy, expectations regarding outcomes/long term efficacy, and risk/benefit.   I shared with her a summary of the literature for UFE efficacy, which generally is accepted to be adequate symptom relief with good restoration of quality of life at 1 year in 90% or greater of patients for bleeding>pain> bulk symptoms.  I also told her that there is a cited rate of  retreatment in ~15-20% of patients in the long term, with overall excellent long term utility of symptom relief and QOL.    We discussed the expectations not just for the treatment day/23 hour observation, but the first 3 months, 6 months - 12 months, and our follow up.   We also discussed superior hypogastric nerve block as adjunct, which she is interested.  Regarding risks, specific risks discussed include: post-embolization syndrome, bleeding, infection (specifically endometritis), contrast reaction, kidney/artery injury, need for further surgery/procedure, including hysterectomy, need for hospitalization, cardiopulmonary collapse, death.    Regarding post-embolization syndrome, I did let her know that this is essentially expected, with a typical prodromal syndrome lasting 4-7 days typically, and usually treated with medication/support such as hydration, rest, analgesics, PO nausea medications, and stool softeners.   I neglected to discuss with her the need for MRI and the possibility that she may be excluded by findings, or possibly requiring a follow up conversation if we feel that adenomyosis is present/contributing. If she would like further discussion regarding the need for MRI, I am happy to discuss.  Otherwise we will proceed with MRI.   After discussing, she would like to proceed with treatment. Her intention is to be treated soon.   Plan: - plan for MRI to evaluate uterus/fibroids.  We did not discuss this, but is necessary for planning.  If she would like me to discuss I am happy to.  - plan tentatively to proceed with uterine artery embolization at first available, with Dr. Earleen Newport.   - plan for superior hypogastric nerve block.    Thank you for this interesting consult.  I greatly enjoyed meeting Mackenzie Clark and look forward to participating in their care.  A copy of this report was sent to the requesting provider on this date.  Electronically Signed: Corrie Mckusick 07/10/2019,  1:51 PM   I spent a total of  40 Minutes   in remote  clinical consultation, greater than 50% of which was counseling/coordinating care for symptomatic uterine fibroids, possible uterine artery  embolization.    Visit type: Audio only (telephone). Audio (no video) only due to patient's lack of internet/smartphone capability. Alternative for in-person consultation at [facility], [address], [place], [place]. This visit type was conducted due to national recommendations for restrictions regarding the COVID-19 Pandemic (e.g. social distancing).  This format is felt to be most appropriate for this patient at this time.  All issues noted in this document were discussed and addressed.

## 2019-07-26 ENCOUNTER — Ambulatory Visit (HOSPITAL_COMMUNITY)
Admission: RE | Admit: 2019-07-26 | Discharge: 2019-07-26 | Disposition: A | Discharge status: Home / Self Care | Payer: No Typology Code available for payment source | Source: Ambulatory Visit | Attending: Interventional Radiology | Admitting: Interventional Radiology

## 2019-07-26 ENCOUNTER — Other Ambulatory Visit: Payer: Self-pay

## 2019-07-26 DIAGNOSIS — D250 Submucous leiomyoma of uterus: Secondary | ICD-10-CM | POA: Insufficient documentation

## 2019-07-26 MED ORDER — GADOBUTROL 1 MMOL/ML IV SOLN
10.0000 mL | Freq: Once | INTRAVENOUS | Status: AC | PRN
  Administered 2019-07-26: 10 mL via INTRAVENOUS

## 2019-07-30 ENCOUNTER — Encounter: Payer: Self-pay | Admitting: *Deleted

## 2019-08-07 ENCOUNTER — Other Ambulatory Visit (HOSPITAL_COMMUNITY): Payer: Self-pay | Admitting: Interventional Radiology

## 2019-08-07 DIAGNOSIS — D259 Leiomyoma of uterus, unspecified: Secondary | ICD-10-CM

## 2019-08-22 MED FILL — CONTRAVE ER 8-90 MG TABLET: 8-90 | 30 days supply | Qty: 120 | Fill #1

## 2019-08-28 ENCOUNTER — Ambulatory Visit: Admit: 2019-08-28 | Payer: No Typology Code available for payment source | Admitting: Family Medicine

## 2019-08-28 ENCOUNTER — Other Ambulatory Visit (HOSPITAL_COMMUNITY): Payer: No Typology Code available for payment source

## 2019-08-28 ENCOUNTER — Other Ambulatory Visit (HOSPITAL_COMMUNITY)
Admission: RE | Admit: 2019-08-28 | Discharge: 2019-08-28 | Disposition: A | Discharge status: Home / Self Care | Payer: No Typology Code available for payment source | Source: Ambulatory Visit | Attending: Interventional Radiology | Admitting: Interventional Radiology

## 2019-08-28 DIAGNOSIS — Z01812 Encounter for preprocedural laboratory examination: Secondary | ICD-10-CM | POA: Diagnosis not present

## 2019-08-28 DIAGNOSIS — Z20822 Contact with and (suspected) exposure to covid-19: Secondary | ICD-10-CM | POA: Diagnosis not present

## 2019-08-28 LAB — SARS CORONAVIRUS 2 (TAT 6-24 HRS): SARS Coronavirus 2: NEGATIVE

## 2019-08-28 SURGERY — HYSTERECTOMY, VAGINAL
Anesthesia: Choice | Laterality: Bilateral

## 2019-08-30 ENCOUNTER — Other Ambulatory Visit: Payer: Self-pay | Admitting: Student

## 2019-08-31 ENCOUNTER — Observation Stay (HOSPITAL_COMMUNITY)
Admission: RE | Admit: 2019-08-31 | Discharge: 2019-09-01 | Disposition: A | Discharge status: Home / Self Care | Payer: No Typology Code available for payment source | Source: Ambulatory Visit | Attending: Interventional Radiology | Admitting: Interventional Radiology

## 2019-08-31 ENCOUNTER — Ambulatory Visit (HOSPITAL_COMMUNITY)
Admission: RE | Admit: 2019-08-31 | Discharge: 2019-08-31 | Disposition: A | Discharge status: Home / Self Care | Payer: No Typology Code available for payment source | Source: Ambulatory Visit | Attending: Interventional Radiology | Admitting: Interventional Radiology

## 2019-08-31 ENCOUNTER — Other Ambulatory Visit: Payer: Self-pay

## 2019-08-31 ENCOUNTER — Encounter (HOSPITAL_COMMUNITY): Payer: Self-pay

## 2019-08-31 DIAGNOSIS — E119 Type 2 diabetes mellitus without complications: Secondary | ICD-10-CM | POA: Insufficient documentation

## 2019-08-31 DIAGNOSIS — Z794 Long term (current) use of insulin: Secondary | ICD-10-CM | POA: Diagnosis not present

## 2019-08-31 DIAGNOSIS — D259 Leiomyoma of uterus, unspecified: Secondary | ICD-10-CM

## 2019-08-31 DIAGNOSIS — I10 Essential (primary) hypertension: Secondary | ICD-10-CM | POA: Insufficient documentation

## 2019-08-31 DIAGNOSIS — N939 Abnormal uterine and vaginal bleeding, unspecified: Secondary | ICD-10-CM | POA: Diagnosis present

## 2019-08-31 DIAGNOSIS — Z79899 Other long term (current) drug therapy: Secondary | ICD-10-CM | POA: Diagnosis not present

## 2019-08-31 DIAGNOSIS — D251 Intramural leiomyoma of uterus: Secondary | ICD-10-CM | POA: Diagnosis not present

## 2019-08-31 DIAGNOSIS — D219 Benign neoplasm of connective and other soft tissue, unspecified: Secondary | ICD-10-CM | POA: Diagnosis present

## 2019-08-31 HISTORY — PX: IR EMBO TUMOR ORGAN ISCHEMIA INFARCT INC GUIDE ROADMAPPING: IMG5449

## 2019-08-31 HISTORY — PX: IR ANGIOGRAM PELVIS SELECTIVE OR SUPRASELECTIVE: IMG661

## 2019-08-31 HISTORY — PX: IR ANGIOGRAM SELECTIVE EACH ADDITIONAL VESSEL: IMG667

## 2019-08-31 HISTORY — PX: IR US GUIDE VASC ACCESS RIGHT: IMG2390

## 2019-08-31 HISTORY — PX: IR FLUORO GUIDED NEEDLE PLC ASPIRATION/INJECTION LOC: IMG2395

## 2019-08-31 LAB — CBC WITH DIFFERENTIAL/PLATELET
Abs Immature Granulocytes: 0.01 10*3/uL (ref 0.00–0.07)
Basophils Absolute: 0 10*3/uL (ref 0.0–0.1)
Basophils Relative: 1 %
Eosinophils Absolute: 0 10*3/uL (ref 0.0–0.5)
Eosinophils Relative: 1 %
HCT: 35.2 % — ABNORMAL LOW (ref 36.0–46.0)
Hemoglobin: 11.2 g/dL — ABNORMAL LOW (ref 12.0–15.0)
Immature Granulocytes: 0 %
Lymphocytes Relative: 34 %
Lymphs Abs: 1.2 10*3/uL (ref 0.7–4.0)
MCH: 29 pg (ref 26.0–34.0)
MCHC: 31.8 g/dL (ref 30.0–36.0)
MCV: 91.2 fL (ref 80.0–100.0)
Monocytes Absolute: 0.4 10*3/uL (ref 0.1–1.0)
Monocytes Relative: 12 %
Neutro Abs: 1.8 10*3/uL (ref 1.7–7.7)
Neutrophils Relative %: 52 %
Platelets: 241 10*3/uL (ref 150–400)
RBC: 3.86 MIL/uL — ABNORMAL LOW (ref 3.87–5.11)
RDW: 14 % (ref 11.5–15.5)
WBC: 3.5 10*3/uL — ABNORMAL LOW (ref 4.0–10.5)
nRBC: 0 % (ref 0.0–0.2)

## 2019-08-31 LAB — BASIC METABOLIC PANEL
Anion gap: 9 (ref 5–15)
BUN: 13 mg/dL (ref 6–20)
CO2: 28 mmol/L (ref 22–32)
Calcium: 9.3 mg/dL (ref 8.9–10.3)
Chloride: 104 mmol/L (ref 98–111)
Creatinine, Ser: 1.01 mg/dL — ABNORMAL HIGH (ref 0.44–1.00)
GFR calc Af Amer: >60 mL/min (ref >60)
GFR calc non Af Amer: >60 mL/min (ref >60)
Glucose, Bld: 85 mg/dL (ref 70–99)
Potassium: 3.7 mmol/L (ref 3.5–5.1)
Sodium: 141 mmol/L (ref 135–145)

## 2019-08-31 LAB — HCG, SERUM, QUALITATIVE: Preg, Serum: NEGATIVE

## 2019-08-31 LAB — PROTIME-INR
INR: 1 (ref 0.8–1.2)
Prothrombin Time: 12.6 seconds (ref 11.4–15.2)

## 2019-08-31 LAB — GLUCOSE, CAPILLARY: Glucose-Capillary: 76 mg/dL (ref 70–99)

## 2019-08-31 MED ORDER — SODIUM CHLORIDE 0.9 % IV SOLN: 250.0000 mL | INTRAVENOUS | Status: DC | PRN

## 2019-08-31 MED ORDER — IOHEXOL 300 MG/ML  SOLN: 100.0000 mL | Freq: Once | INTRAMUSCULAR | Status: DC | PRN

## 2019-08-31 MED ORDER — ROPIVACAINE HCL 5 MG/ML IJ SOLN
INTRAMUSCULAR | Status: AC
  Filled 2019-08-31: qty 30

## 2019-08-31 MED ORDER — SODIUM CHLORIDE 0.9% FLUSH
3.0000 mL | Freq: Two times a day (BID) | INTRAVENOUS | Status: DC
  Administered 2019-09-01: 3 mL via INTRAVENOUS

## 2019-08-31 MED ORDER — PIPERACILLIN-TAZOBACTAM 3.375 G IVPB
INTRAVENOUS | Status: AC
  Filled 2019-08-31: qty 50

## 2019-08-31 MED ORDER — HYDROMORPHONE 1 MG/ML IV SOLN
INTRAVENOUS | Status: DC
  Administered 2019-08-31: 2.1 mL via INTRAVENOUS
  Administered 2019-08-31: 30 mg via INTRAVENOUS
  Administered 2019-09-01: 1.5 mg via INTRAVENOUS
  Administered 2019-09-01: 3.9 mg via INTRAVENOUS
  Filled 2019-08-31 (×10): qty 30

## 2019-08-31 MED ORDER — PROMETHAZINE HCL 25 MG PO TABS: 25.0000 mg | ORAL_TABLET | Freq: Three times a day (TID) | ORAL | Status: DC | PRN

## 2019-08-31 MED ORDER — SODIUM CHLORIDE 0.9% FLUSH: 3.0000 mL | INTRAVENOUS | Status: DC | PRN

## 2019-08-31 MED ORDER — KETOROLAC TROMETHAMINE 30 MG/ML IJ SOLN: 30.0000 mg | Freq: Once | INTRAMUSCULAR | Status: DC

## 2019-08-31 MED ORDER — MIDAZOLAM HCL 2 MG/2ML IJ SOLN
INTRAMUSCULAR | Status: AC | PRN
  Administered 2019-08-31 (×4): 1 mg via INTRAVENOUS

## 2019-08-31 MED ORDER — PIPERACILLIN-TAZOBACTAM 3.375 G IVPB: 3.3750 g | Freq: Once | INTRAVENOUS | Status: DC

## 2019-08-31 MED ORDER — KETOROLAC TROMETHAMINE 30 MG/ML IJ SOLN: 60.0000 mg | Freq: Once | INTRAMUSCULAR | Status: DC

## 2019-08-31 MED ORDER — SODIUM CHLORIDE 0.9 % IV SOLN: INTRAVENOUS | Status: AC

## 2019-08-31 MED ORDER — MIDAZOLAM HCL 2 MG/2ML IJ SOLN
INTRAMUSCULAR | Status: AC
  Filled 2019-08-31: qty 6

## 2019-08-31 MED ORDER — LIDOCAINE HCL 1 % IJ SOLN
INTRAMUSCULAR | Status: AC
  Filled 2019-08-31: qty 20

## 2019-08-31 MED ORDER — ONDANSETRON HCL 4 MG/2ML IJ SOLN
4.0000 mg | Freq: Four times a day (QID) | INTRAMUSCULAR | Status: DC
  Administered 2019-08-31 – 2019-09-01 (×3): 4 mg via INTRAVENOUS
  Filled 2019-08-31 (×3): qty 2

## 2019-08-31 MED ORDER — NALOXONE HCL 0.4 MG/ML IJ SOLN: 0.4000 mg | INTRAMUSCULAR | Status: DC | PRN

## 2019-08-31 MED ORDER — CEFAZOLIN SODIUM-DEXTROSE 2-4 GM/100ML-% IV SOLN: 2.0000 g | Freq: Once | INTRAVENOUS | Status: DC

## 2019-08-31 MED ORDER — LIDOCAINE HCL (PF) 1 % IJ SOLN
INTRAMUSCULAR | Status: AC | PRN
  Administered 2019-08-31 (×2): 5 mL

## 2019-08-31 MED ORDER — CEFAZOLIN SODIUM-DEXTROSE 2-4 GM/100ML-% IV SOLN
INTRAVENOUS | Status: AC
  Administered 2019-08-31: 2 g via INTRAVENOUS
  Filled 2019-08-31: qty 100

## 2019-08-31 MED ORDER — CEFAZOLIN SODIUM-DEXTROSE 2-4 GM/100ML-% IV SOLN: 2.0000 g | Freq: Once | INTRAVENOUS | Status: AC

## 2019-08-31 MED ORDER — DOCUSATE SODIUM 100 MG PO CAPS
100.0000 mg | ORAL_CAPSULE | Freq: Two times a day (BID) | ORAL | Status: DC
  Administered 2019-08-31 – 2019-09-01 (×2): 100 mg via ORAL
  Filled 2019-08-31 (×2): qty 1

## 2019-08-31 MED ORDER — ONDANSETRON HCL 4 MG/2ML IJ SOLN: 4.0000 mg | Freq: Four times a day (QID) | INTRAMUSCULAR | Status: DC | PRN

## 2019-08-31 MED ORDER — DIPHENHYDRAMINE HCL 12.5 MG/5ML PO ELIX
12.5000 mg | ORAL_SOLUTION | Freq: Four times a day (QID) | ORAL | Status: DC | PRN
  Filled 2019-08-31: qty 5

## 2019-08-31 MED ORDER — DIPHENHYDRAMINE HCL 50 MG/ML IJ SOLN: 12.5000 mg | Freq: Four times a day (QID) | INTRAMUSCULAR | Status: DC | PRN

## 2019-08-31 MED ORDER — FENTANYL CITRATE (PF) 100 MCG/2ML IJ SOLN
INTRAMUSCULAR | Status: AC | PRN
  Administered 2019-08-31 (×4): 50 ug via INTRAVENOUS

## 2019-08-31 MED ORDER — LIDOCAINE HCL (PF) 1 % IJ SOLN
INTRAMUSCULAR | Status: AC | PRN
  Administered 2019-08-31: 10 mL

## 2019-08-31 MED ORDER — PROMETHAZINE HCL 25 MG RE SUPP
25.0000 mg | Freq: Three times a day (TID) | RECTAL | Status: DC | PRN
  Filled 2019-08-31: qty 1

## 2019-08-31 MED ORDER — ONDANSETRON HCL 4 MG/2ML IJ SOLN
INTRAMUSCULAR | Status: AC
  Filled 2019-08-31: qty 2

## 2019-08-31 MED ORDER — ONDANSETRON HCL 4 MG/2ML IJ SOLN
INTRAMUSCULAR | Status: AC | PRN
  Administered 2019-08-31: 4 mg via INTRAVENOUS

## 2019-08-31 MED ORDER — KETOROLAC TROMETHAMINE 60 MG/2ML IM SOLN
60.0000 mg | Freq: Once | INTRAMUSCULAR | Status: AC
  Administered 2019-08-31: 60 mg via INTRAMUSCULAR
  Filled 2019-08-31: qty 2

## 2019-08-31 MED ORDER — SODIUM CHLORIDE 0.9 % IV SOLN: INTRAVENOUS | Status: DC

## 2019-08-31 MED ORDER — FENTANYL CITRATE (PF) 100 MCG/2ML IJ SOLN
INTRAMUSCULAR | Status: AC
  Filled 2019-08-31: qty 4

## 2019-08-31 MED ORDER — KETOROLAC TROMETHAMINE 30 MG/ML IJ SOLN
30.0000 mg | Freq: Four times a day (QID) | INTRAMUSCULAR | Status: DC
  Administered 2019-08-31 – 2019-09-01 (×3): 30 mg via INTRAVENOUS
  Filled 2019-08-31 (×3): qty 1

## 2019-08-31 MED ORDER — SODIUM CHLORIDE 0.9% FLUSH: 9.0000 mL | INTRAVENOUS | Status: DC | PRN

## 2019-08-31 NOTE — Procedures (Signed)
Interventional Radiology Procedure Note  Procedure:  US guided access right CFA.  Exoseal for closure. Pelvic angiogram with bilateral uterine artery embolization, for symptomatic UFE.  Superior hypogastric nerve block.  .  Complications: None  Recommendations:  - Right hip straight x 4 hours - advance diet as tolerated - PCA for comfort - Q 6 hr toradol, IV - Scheduled and prn anti-nausea medication - Ice pack/heat pack prn - maintain foley until 6am - hydration - Admit 23 hr for observation - Follow up with Dr. Earleen Newport  Signed,  Dulcy Fanny. Earleen Newport, DO

## 2019-08-31 NOTE — Progress Notes (Signed)
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

## 2019-08-31 NOTE — H&P (Signed)
Chief Complaint: Patient was seen in consultation today for uterine fibroid embolization at the request of [place]   Supervising Physician: Corrie Mckusick  Patient Status: [place]  History of Present Illness: Kathrine Krammes is a 54 y.o. female with symptomatic uterine fibroids and was seen in consult by Dr. Earleen Newport. See full consult note 4/6 for details of discussion. She is now scheduled for uterine artery embolization procedure. PMHx, meds, labs, imaging, allergies reviewed. Feels well, no recent fevers, chills, illness. Has been NPO today as directed.   Past Medical History:  Diagnosis Date  . Allergy   . Diabetes mellitus without complication (HCC)    diet and exercise controlled  . Hypertension     Past Surgical History:  Procedure Laterality Date  . DILATION AND CURETTAGE OF UTERUS  2006   16 week stillbirth  . IR RADIOLOGIST EVAL & MGMT  07/10/2019    Allergies: Patient has no known allergies.  Medications: Prior to Admission medications   Medication Sig Start Date End Date Taking? Authorizing Provider  cetirizine (ZYRTEC ALLERGY) 10 MG tablet Take 1 tablet (10 mg total) by mouth daily. 01/07/18   Glendale Chard, MD  Dulaglutide (TRULICITY) 1.5 0000000 SOPN Inject 1.5 mg into the skin once a week. 03/12/19   Minette Brine, FNP  glucose blood (ACCU-CHEK AVIVA PLUS) test strip 1 each by Other route 2 (two) times daily. Use as instructed 07/22/17   [provider]  metFORMIN (GLUCOPHAGE) 500 MG tablet Take 1 tablet (500 mg total) by mouth 2 (two) times daily with a meal. 03/12/19   Minette Brine, FNP  mometasone (NASONEX) 50 MCG/ACT nasal spray Place 2 sprays into the nose daily. 05/09/19 05/08/20  Glendale Chard, MD  Naltrexone-buPROPion HCl ER (CONTRAVE) 8-90 MG TB12 2 tab po bid 06/27/19   Glendale Chard, MD  norethindrone (MICRONOR) 0.35 MG tablet Take 1 tablet (0.35 mg total) by mouth daily. 05/21/19   Donnamae Jude, MD    olmesartan-hydrochlorothiazide (BENICAR HCT) 40-25 MG tablet Take 1 tablet by mouth daily. 03/12/19   Minette Brine, FNP  valACYclovir (VALTREX) 1000 MG tablet Take 1 tablet (1,000 mg total) by mouth as needed. 03/12/19   Minette Brine, FNP  Vitamin D, Cholecalciferol, 25 MCG (1000 UT) TABS Take 1 tablet by mouth daily.    [provider]  Vitamin D, Ergocalciferol, (DRISDOL) 1.25 MG (50000 UT) CAPS capsule Take 1 capsule (50,000 Units total) by mouth once a week. 03/13/19   Minette Brine, FNP     Family History  Problem Relation Age of Onset  . Hypertension Mother   . Hyperlipidemia Mother   . Hypertension Father   . Diabetes Father   . Heart disease Father        heart attack age 31  . Hypertension Brother   . Cancer Maternal Grandmother 26       Breast  . Obesity Other     Social History   Socioeconomic History  . Marital status: Divorced    Spouse name: Not on file  . Number of children: Not on file  . Years of education: Not on file  . Highest education level: Not on file  Occupational History  . Occupation: CMA    Employer: [employer]  Tobacco Use  . Smoking status: Never Smoker  . Smokeless tobacco: Never Used  Substance and Sexual Activity  . Alcohol use: Yes    Comment: occasionally  . Drug use: No  .  Sexual activity: Yes    Partners: Male    Birth control/protection: OCP, Pill  Other Topics Concern  . Not on file  Social History Narrative  . Not on file   Social Determinants of Health   Financial Resource Strain:   . Difficulty of Paying Living Expenses:   Food Insecurity:   . Worried About Charity fundraiser in the Last Year:   . Arboriculturist in the Last Year:   Transportation Needs:   . Film/video editor (Medical):   Marland Kitchen Lack of Transportation (Non-Medical):   Physical Activity:   . Days of Exercise per Week:   . Minutes of Exercise per Session:   Stress:   . Feeling of Stress :   Social  Connections:   . Frequency of Communication with Friends and Family:   . Frequency of Social Gatherings with Friends and Family:   . Attends Religious Services:   . Active Member of Clubs or Organizations:   . Attends Archivist Meetings:   Marland Kitchen Marital Status:      Review of Systems: A 12 point ROS discussed and pertinent positives are indicated in the HPI above.  All other systems are negative.  Review of Systems  Vital Signs: BP (!) 157/98 (BP Location: Right Arm)   Pulse 82   Temp 98.3 F (36.8 C) (Oral)   Resp 18   SpO2 93%   Physical Exam Constitutional:      Appearance: Normal appearance. She is obese. She is not ill-appearing.  HENT:     Mouth/Throat:     Mouth: Mucous membranes are moist.     Pharynx: Oropharynx is clear.  Cardiovascular:     Rate and Rhythm: Normal rate and regular rhythm.     Pulses: Normal pulses.     Heart sounds: Normal heart sounds.  Pulmonary:     Effort: Pulmonary effort is normal. No respiratory distress.     Breath sounds: Normal breath sounds. No wheezing, rhonchi or rales.  Abdominal:     General: Abdomen is flat. There is no distension.     Palpations: Abdomen is soft. There is no mass.     Tenderness: There is no abdominal tenderness.  Skin:    General: Skin is warm and dry.  Neurological:     General: No focal deficit present.     Mental Status: She is alert and oriented to person, place, and time.  Psychiatric:        Mood and Affect: Mood normal.        Thought Content: Thought content normal.        Judgment: Judgment normal.      Imaging: No results found.  Labs:  CBC: No results for input(s): WBC, HGB, HCT, PLT in the last 8760 hours.  COAGS: No results for input(s): INR, APTT in the last 8760 hours.  BMP: Recent Labs    11/02/18 1206 03/12/19 1235  NA 143 141  K 3.0* 3.4*  CL 102 101  CO2 23 23  GLUCOSE 91 90  BUN 10 10  CALCIUM 9.6 9.4  CREATININE 0.84 0.79  GFRNONAA 81 87  GFRAA 94  100    LIVER FUNCTION TESTS: No results for input(s): BILITOT, AST, ALT, ALKPHOS, PROT, ALBUMIN in the last 8760 hours.  TUMOR MARKERS: No results for input(s): AFPTM, CEA, CA199, CHROMGRNA in the last 8760 hours.  Assessment and Plan: Symptomatic uterine fibroids Plan for uterine artery embo. Will also plan for  sup hypogastric nerve block prior. Labs pending. Will admit pt overnight for observation. Risks and benefits of uterine artery embo were discussed with the patient including, but not limited to bleeding, infection, vascular injury or contrast induced renal failure.  This interventional procedure involves the use of X-rays and because of the nature of the planned procedure, it is possible that we will have prolonged use of X-ray fluoroscopy.  Potential radiation risks to you include (but are not limited to) the following: - A slightly elevated risk for cancer  several years later in life. This risk is typically less than 0.5% percent. This risk is low in comparison to the normal incidence of human cancer, which is 33% for women and 50% for men according to the [place]. - Radiation induced injury can include skin redness, resembling a rash, tissue breakdown / ulcers and hair loss (which can be temporary or permanent).   The likelihood of either of these occurring depends on the difficulty of the procedure and whether you are sensitive to radiation due to previous procedures, disease, or genetic conditions.   IF your procedure requires a prolonged use of radiation, you will be notified and given written instructions for further action.  It is your responsibility to monitor the irradiated area for the 2 weeks following the procedure and to notify your physician if you are concerned that you have suffered a radiation induced injury.    All of the patient's questions were answered, patient is agreeable to proceed.  Consent signed and in chart.     Thank you for  this interesting consult.  I greatly enjoyed meeting Loreena Lemoine and look forward to participating in their care.  A copy of this report was sent to the requesting provider on this date.  Electronically Signed: Ascencion Dike, PA-C 08/31/2019, 12:16 PM   I spent a total of 25 minutes in face to face in clinical consultation, greater than 50% of which was counseling/coordinating care for [place] procedure

## 2019-08-31 NOTE — Plan of Care (Signed)

## 2019-09-01 ENCOUNTER — Other Ambulatory Visit (HOSPITAL_COMMUNITY): Payer: Self-pay | Admitting: Radiology

## 2019-09-01 ENCOUNTER — Other Ambulatory Visit: Payer: Self-pay | Admitting: Radiology

## 2019-09-01 ENCOUNTER — Other Ambulatory Visit: Payer: Self-pay | Admitting: Diagnostic Radiology

## 2019-09-01 DIAGNOSIS — D251 Intramural leiomyoma of uterus: Secondary | ICD-10-CM | POA: Diagnosis not present

## 2019-09-01 DIAGNOSIS — D250 Submucous leiomyoma of uterus: Secondary | ICD-10-CM

## 2019-09-01 MED ORDER — PROMETHAZINE HCL 25 MG PO TABS: 25.0000 mg | ORAL_TABLET | Freq: Three times a day (TID) | ORAL | 0 refills | Status: DC | PRN

## 2019-09-01 MED ORDER — IBUPROFEN 800 MG PO TABS: 800.0000 mg | ORAL_TABLET | Freq: Three times a day (TID) | ORAL | 0 refills | Status: DC

## 2019-09-01 MED ORDER — IBUPROFEN 800 MG PO TABS
800.0000 mg | ORAL_TABLET | Freq: Three times a day (TID) | ORAL | 0 refills | Status: DC
Start: 2019-09-01 — End: 2019-09-01

## 2019-09-01 MED ORDER — IBUPROFEN 800 MG PO TABS: 800.0000 mg | ORAL_TABLET | Freq: Three times a day (TID) | ORAL | 0 refills | Status: AC

## 2019-09-01 MED ORDER — HYDROCODONE-ACETAMINOPHEN 5-325 MG PO TABS: 1.0000 | ORAL_TABLET | Freq: Four times a day (QID) | ORAL | 0 refills | Status: DC | PRN

## 2019-09-01 MED ORDER — HYDROCODONE-ACETAMINOPHEN 5-325 MG PO TABS: 1.0000 | ORAL_TABLET | Freq: Four times a day (QID) | ORAL | 0 refills | Status: AC | PRN

## 2019-09-01 NOTE — Discharge Instructions (Signed)
Uterine Artery Embolization for Fibroids, Care After °This sheet gives you information about how to care for yourself after your procedure. Your health care provider may also give you more specific instructions. If you have problems or questions, contact your health care provider. °What can I expect after the procedure? °After your procedure, it is common to have: °· Pelvic cramping. You will be given pain medicine. °· Nausea and vomiting. You may be given medicine to help relieve nausea. °Follow these instructions at home: °Incision care °· Follow instructions from your health care provider about how to take care of your incision. Make sure you: °? Wash your hands with soap and water before you change your bandage (dressing). If soap and water are not available, use hand sanitizer. °? Change your dressing as told by your health care provider. °· Check your incision area every day for signs of infection. Check for: °? More redness, swelling, or pain. °? More fluid or blood. °? Warmth. °? Pus or a bad smell. °Medicines ° °· Take over-the-counter and prescription medicines only as told by your health care provider. °· Do not take aspirin. It can cause bleeding. °· Do not drive for 24 hours if you were given a medicine to help you relax (sedative). °· Do not drive or use heavy machinery while taking prescription pain medicine. °General instructions °· Ask your health care provider when you can resume sexual activity. °· To prevent or treat constipation while you are taking prescription pain medicine, your health care provider may recommend that you: °? Drink enough fluid to keep your urine clear or pale yellow. °? Take over-the-counter or prescription medicines. °? Eat foods that are high in fiber, such as fresh fruits and vegetables, whole grains, and beans. °? Limit foods that are high in fat and processed sugars, such as fried and sweet foods. °Contact a health care provider if: °· You have a fever. °· You have more  redness, swelling, or pain around your incision site. °· You have more fluid or blood coming from your incision site. °· Your incision feels warm to the touch. °· You have pus or a bad smell coming from your incision. °· You have a rash. °· You have uncontrolled nausea or you cannot eat or drink anything without vomiting. °Get help right away if: °· You have trouble breathing. °· You have chest pain. °· You have severe abdominal pain. °· You have leg pain. °· You become dizzy and faint. °Summary °· After your procedure, it is common to have pelvic cramping. You will be given pain medicine. °· Follow instructions from your health care provider about how to take care of your incision. °· Check your incision area every day for signs of infection. °· Take over-the-counter and prescription medicines only as told by your health care provider. °This information is not intended to replace advice given to you by your health care provider. Make sure you discuss any questions you have with your health care provider. °Document Revised: 03/04/2017 Document Reviewed: 06/24/2016 °Elsevier Patient Education © 2020 [employer] Inc. ° °

## 2019-09-01 NOTE — Progress Notes (Signed)
Patient ID: Mackenzie Clark, female   DOB: 1965-08-15, 54 y.o.   MRN: [id number] Electronic prescription sent to pt's pharmacy for [place] 5/325,#30, no refills, 1 tab every 6 hours as needed for moderate to severe pain

## 2019-09-01 NOTE — Discharge Summary (Signed)
Physician Discharge Summary      Patient ID: Mackenzie Clark MRN: [id number] DOB/AGE: 1966-03-28 54 y.o.  Admit date: 08/31/2019 Discharge date: 09/01/2019  Admission Diagnoses: Active Problems:   Fibroids   Abnormal uterine bleeding due to intramural leiomyoma  Discharge Diagnoses:  Active Problems:   Fibroids   Abnormal uterine bleeding due to intramural leiomyoma    Procedures: [place] 5/28  Discharged Condition: good  Hospital Course: Successful [place] 5/28 Admitted in stable condition. No overnight issues. Pain minimal, PCA discontinued. Pt tolerating diet and OOB Stable for discharge home.  Consults: None   Discharge Exam: Blood pressure 128/90, pulse 72, temperature 98.4 F (36.9 C), temperature source Oral, resp. rate 16, height 5\' 7"  (1.702 m), weight 92.1 kg, SpO2 100 %. Lungs: CTA Heart: Reg Abd: soft, ND, NT Ext: (R)groin soft, NT, good distal pulse  Disposition: Discharge disposition: 01-Home or Self Care       Discharge Instructions    Call MD for:  persistant nausea and vomiting   Complete by: As directed    Call MD for:  redness, tenderness, or signs of infection (pain, swelling, redness, odor or green/yellow discharge around incision site)   Complete by: As directed    Call MD for:  severe uncontrolled pain   Complete by: As directed    Call MD for:  temperature >100.4   Complete by: As directed    Diet - low sodium heart healthy   Complete by: As directed    Driving Restrictions   Complete by: As directed    Do not drive while taking narcotic pain medications   Increase activity slowly   Complete by: As directed    May shower / Bathe   Complete by: As directed    May walk up steps   Complete by: As directed    Remove dressing in 24 hours   Complete by: As directed      Allergies as of 09/01/2019   No Known Allergies     Medication List    TAKE these medications   Accu-Chek Aviva Plus test strip Generic drug: glucose  blood 1 each by Other route 2 (two) times daily. Use as instructed   cetirizine 10 MG tablet Commonly known as: ZyrTEC Allergy Take 1 tablet (10 mg total) by mouth daily. What changed:   when to take this  reasons to take this   Contrave 8-90 MG Tb12 Generic drug: Naltrexone-buPROPion HCl ER 2 tab po bid   HYDROcodone-acetaminophen 5-325 MG tablet Commonly known as: Norco Take 1-2 tablets by mouth every 6 (six) hours as needed for up to 7 days.   ibuprofen 800 MG tablet Commonly known as: ADVIL Take 1 tablet (800 mg total) by mouth 3 (three) times daily for 7 days.   metFORMIN 500 MG tablet Commonly known as: GLUCOPHAGE Take 1 tablet (500 mg total) by mouth 2 (two) times daily with a meal.   mometasone 50 MCG/ACT nasal spray Commonly known as: Nasonex Place 2 sprays into the nose daily.   norethindrone 0.35 MG tablet Commonly known as: MICRONOR Take 1 tablet (0.35 mg total) by mouth daily.   olmesartan-hydrochlorothiazide 40-25 MG tablet Commonly known as: BENICAR HCT Take 1 tablet by mouth daily.   promethazine 25 MG tablet Commonly known as: PHENERGAN Take 1 tablet (25 mg total) by mouth every 8 (eight) hours as needed for nausea.   Trulicity 1.5 0000000 Sopn Generic drug: Dulaglutide Inject 1.5 mg into the skin once a week.  valACYclovir 1000 MG tablet Commonly known as: VALTREX Take 1 tablet (1,000 mg total) by mouth as needed. What changed:   when to take this  reasons to take this   Vitamin D (Ergocalciferol) 1.25 MG (50000 UNIT) Caps capsule Commonly known as: DRISDOL Take 1 capsule (50,000 Units total) by mouth once a week.      Follow-up Information    Corrie Mckusick, DO. Go in 2 week(s).   Specialties: Interventional Radiology, Radiology Contact information: [place] [address] [place] [id number]           Signed: Ascencion Dike PA-C 09/01/2019, 10:33 AM

## 2019-09-01 NOTE — Progress Notes (Signed)
Wasted 22 mL of Dilaudid from PCA on discharge with [facility].

## 2019-09-01 NOTE — Plan of Care (Signed)
  Problem: Education: Goal: Knowledge of General Education information will improve Description: Including pain rating scale, medication(s)/side effects and non-pharmacologic comfort measures 09/01/2019 0853 by Lyndal Pulley, RN Outcome: Progressing 09/01/2019 0853 by Lyndal Pulley, RN Outcome: Progressing

## 2019-09-01 NOTE — Plan of Care (Signed)
  Problem: Education: Goal: Knowledge of General Education information will improve Description: Including pain rating scale, medication(s)/side effects and non-pharmacologic comfort measures 09/01/2019 0853 by Lyndal Pulley, RN Outcome: Adequate for Discharge 09/01/2019 0853 by Lyndal Pulley, RN Outcome: Progressing 09/01/2019 0853 by Lyndal Pulley, RN Outcome: Progressing

## 2019-09-01 NOTE — Progress Notes (Signed)
Patient is stable, pain is well controlled. No pain reported. Ambulating and voiding independently. Tolerating a diet. D/c to home.

## 2019-09-05 ENCOUNTER — Other Ambulatory Visit: Payer: Self-pay | Admitting: Interventional Radiology

## 2019-09-05 DIAGNOSIS — D259 Leiomyoma of uterus, unspecified: Secondary | ICD-10-CM

## 2019-09-18 ENCOUNTER — Encounter: Payer: Self-pay | Admitting: *Deleted

## 2019-09-18 ENCOUNTER — Other Ambulatory Visit: Payer: Self-pay

## 2019-09-18 ENCOUNTER — Ambulatory Visit
Admission: RE | Admit: 2019-09-18 | Discharge: 2019-09-18 | Disposition: A | Discharge status: Home / Self Care | Payer: No Typology Code available for payment source | Source: Ambulatory Visit | Attending: Interventional Radiology | Admitting: Interventional Radiology

## 2019-09-18 DIAGNOSIS — D259 Leiomyoma of uterus, unspecified: Secondary | ICD-10-CM

## 2019-09-18 HISTORY — PX: IR RADIOLOGIST EVAL & MGMT: IMG5224

## 2019-09-18 NOTE — Progress Notes (Signed)
Chief Complaint: SP Treatment of symptomatic uterine fibroids.    Referring Physician(s): OB/GYN: Donnamae Jude PCP: Dr. Glendale Chard  History of Present Illness: Mackenzie Clark is a 54 y.o. female presenting as a scheduled follow up to [place] [facility] today, SP uterine artery embolization for symptomatic uterine fibroids.   She joins [place] today via virtual visit given COVID.  I confirmed identity with 2 personal identifiers.   We treated Mackenzie Gaugh on Friday 08/31/19 with right CFA access, superior hypogastric nerve block, pelvic angiogram, and embolization of bilateral uterine arteries.   She was discharged the following day on Saturday, the 29th.    She tells me that there was some confusion with her prescriptions, particularly the norco that was prescribed, and that this was not available to her until the next day on Sunday.  She tells me that the first 5 days were uncomfortable but tolerable.  She stopped taking the norco after about 5 days, and was taking mostly the ibuprofen.  After the first 5 days she became constipated.  At this point, her pain has subsided and she is not using any additional medications for pain.  She tells me that her bowel habits have become more regular now.   She denies any concerning discharge or concern for infection.  She reports that the access site has healed adequately without concern. She does tell me that she has been having occasional "hot flashes" at night that will leave her somewhat sweaty, and that she feels like she needs air conditioning to be comfortable.  While these occasionally happened before our treatment, the events seem to be more frequent now.  She denies any associated rigors or chills.  She does not feel like these are from infection, and she denies any other symptoms.   She has appointment with her PCP in a few weeks.   She is uncertain of her next follow up with OB.     Past Medical History:  Diagnosis Date  . Allergy   .  Diabetes mellitus without complication (HCC)    diet and exercise controlled  . Hypertension     Past Surgical History:  Procedure Laterality Date  . DILATION AND CURETTAGE OF UTERUS  2006   16 week stillbirth  . IR ANGIOGRAM PELVIS SELECTIVE OR SUPRASELECTIVE  08/31/2019  . IR ANGIOGRAM SELECTIVE EACH ADDITIONAL VESSEL  08/31/2019  . IR EMBO TUMOR ORGAN ISCHEMIA INFARCT INC GUIDE ROADMAPPING  08/31/2019  . IR FLUORO GUIDED NEEDLE PLC ASPIRATION/INJECTION LOC  08/31/2019  . IR RADIOLOGIST EVAL & MGMT  07/10/2019  . IR US GUIDE VASC ACCESS RIGHT  08/31/2019    Allergies: Patient has no known allergies.  Medications: Prior to Admission medications   Medication Sig Start Date End Date Taking? Authorizing Provider  cetirizine (ZYRTEC ALLERGY) 10 MG tablet Take 1 tablet (10 mg total) by mouth daily. Patient taking differently: Take 10 mg by mouth daily as needed for allergies.  01/07/18   Glendale Chard, MD  Dulaglutide (TRULICITY) 1.5 WY/6.3ZC SOPN Inject 1.5 mg into the skin once a week. 03/12/19   Minette Brine, FNP  glucose blood (ACCU-CHEK AVIVA PLUS) test strip 1 each by Other route 2 (two) times daily. Use as instructed 07/22/17   [provider]  HYDROcodone-acetaminophen (NORCO) 5-325 MG tablet Take 1 tablet by mouth every 6 (six) hours as needed for moderate pain. 09/01/19   Allred, Darrell K, PA-C  metFORMIN (GLUCOPHAGE) 500 MG tablet Take 1 tablet (500 mg total) by  mouth 2 (two) times daily with a meal. 03/12/19   Minette Brine, FNP  mometasone (NASONEX) 50 MCG/ACT nasal spray Place 2 sprays into the nose daily. 05/09/19 05/08/20  Glendale Chard, MD  Naltrexone-buPROPion HCl ER (CONTRAVE) 8-90 MG TB12 2 tab po bid 06/27/19   Glendale Chard, MD  norethindrone (MICRONOR) 0.35 MG tablet Take 1 tablet (0.35 mg total) by mouth daily. 05/21/19   Donnamae Jude, MD  olmesartan-hydrochlorothiazide (BENICAR HCT) 40-25 MG tablet Take 1 tablet by mouth daily. 03/12/19   Minette Brine, FNP    promethazine (PHENERGAN) 25 MG tablet Take 1 tablet (25 mg total) by mouth every 8 (eight) hours as needed for nausea. 09/01/19   Ascencion Dike, PA-C  valACYclovir (VALTREX) 1000 MG tablet Take 1 tablet (1,000 mg total) by mouth as needed. Patient taking differently: Take 1,000 mg by mouth daily as needed (fever blisters).  03/12/19   Minette Brine, FNP  Vitamin D, Ergocalciferol, (DRISDOL) 1.25 MG (50000 UT) CAPS capsule Take 1 capsule (50,000 Units total) by mouth once a week. 03/13/19   Minette Brine, FNP     Family History  Problem Relation Age of Onset  . Hypertension Mother   . Hyperlipidemia Mother   . Hypertension Father   . Diabetes Father   . Heart disease Father        heart attack age 54  . Hypertension Brother   . Cancer Maternal Grandmother 43       Breast  . Obesity Other     Social History   Socioeconomic History  . Marital status: Divorced    Spouse name: Not on file  . Number of children: Not on file  . Years of education: Not on file  . Highest education level: Not on file  Occupational History  . Occupation: CMA    Employer: [employer]  Tobacco Use  . Smoking status: Never Smoker  . Smokeless tobacco: Never Used  Vaping Use  . Vaping Use: Never used  Substance and Sexual Activity  . Alcohol use: Yes    Comment: occasionally  . Drug use: No  . Sexual activity: Yes    Partners: Male    Birth control/protection: OCP, Pill  Other Topics Concern  . Not on file  Social History Narrative  . Not on file   Social Determinants of Health   Financial Resource Strain:   . Difficulty of Paying Living Expenses:   Food Insecurity:   . Worried About Charity fundraiser in the Last Year:   . Arboriculturist in the Last Year:   Transportation Needs:   . Film/video editor (Medical):   Marland Kitchen Lack of Transportation (Non-Medical):   Physical Activity:   . Days of Exercise per Week:   . Minutes of Exercise per Session:    Stress:   . Feeling of Stress :   Social Connections:   . Frequency of Communication with Friends and Family:   . Frequency of Social Gatherings with Friends and Family:   . Attends Religious Services:   . Active Member of Clubs or Organizations:   . Attends Archivist Meetings:   Marland Kitchen Marital Status:        Review of Systems  Review of Systems: A 12 point ROS discussed and pertinent positives are indicated in the HPI above.  All other systems are negative.  Physical Exam No direct physical exam was performed (except for noted visual exam findings  with Video Visits).    Vital Signs: There were no vitals taken for this visit.  Imaging: IR Angiogram Pelvis Selective Or Supraselective  Result Date: 08/31/2019 INDICATION: 54 year old female with a history of symptomatic uterine fibroids, presents for treatment with uterine artery embolization EXAM: ULTRASOUND GUIDED ACCESS RIGHT COMMON FEMORAL ARTERY IMAGE GUIDED SUPERIOR HYPOGASTRIC NERVE BLOCK PELVIC ANGIOGRAM AND THERAPEUTIC EMBOLIZATION OF BILATERAL UTERINE ARTERIES EXOSEAL FOR HEMOSTASIS MEDICATIONS: 4 g Zofran, 2 g Ancef. The antibiotic was administered within 1 hour of the procedure ANESTHESIA/SEDATION: Moderate (conscious) sedation was employed during this procedure. A total of Versed 4.0 mg and Fentanyl 200 mcg was administered intravenously. Moderate Sedation Time: 71 minutes. The patient's level of consciousness and vital signs were monitored continuously by radiology nursing throughout the procedure under my direct supervision. CONTRAST:  120 cc Omni FLUOROSCOPY TIME:  Fluoroscopy Time: 19 minutes 0 seconds (3448 mGy). COMPLICATIONS: None PROCEDURE: The procedure, risks, benefits, and alternatives were explained to the patient. Questions regarding the procedure were encouraged and answered. The patient understands and consents to the procedure. The right groin was prepped with Betadine in a sterile fashion, and a sterile  drape was applied covering the operative field. A sterile gown and sterile gloves were used for the procedure. Local anesthesia was provided with 1% Lidocaine. Ultrasound survey of the right inguinal region was performed with images stored and sent to PACs. The skin and subcutaneous tissue generously infiltrated with 1% lidocaine for local anesthesia, and a micropuncture needle was advanced under ultrasound guidance into the right common femoral artery. With excellent blood flow returned, micro wire was advanced. The needle was removed and a micropuncture set was advanced over the micro wire. The inner dilator in the stylet were removed, and an 035 Bentson wire was advanced into the abdominal aorta. The micro sheath was removed, a 5 [place] vascular sheath was placed into the common femoral artery. The dilator was removed and the sheath was flushed. A 5 French Cobra catheter was advanced over the Bentson wire and used to select the left common iliac artery. The Cobra catheter was advanced into the internal iliac artery. Once the catheter was over the bifurcation of the aorta, a superior hypogastric nerve block was performed via an anterior approach by directing a 22 gauge, 15 cm Chiba needle onto the anterior surface of L5 below the bifurcation of the aorta. Once the surface the needle was in contact with the vertebral body, the stylet was removed, small amount of contrast confirmed a prevertebral needle location, and a solution of contrast and ~20cc 0.5% ropivacaine was infused with fluoroscopy. Approximately 20 cc of ropivacaine was administered. The needle was removed. Angiogram of the left hypogastric artery was performed for identification of the left uterine artery. Once we identified the origin, a high-flow Renegade micro catheter was advanced to the Cobra catheter with a 0.014 Fathom wire. The micro catheter and micro wire combination were used to select the uterine artery. Angiogram was performed. Once we  confirmed position the micro catheter within the distal descending left uterine artery, embolization was performed. Embolization of the left uterine artery was performed with 3 vial of embospheres (500 - 700 microm). Embolization was performed to 5 beat stasis. The the micro wire/micro catheter were removed, and the Cobra catheter and Bentson wire were used to form a Waltman loop. The Waltman loop was then used to select the ipsilateral hypogastric artery. Angiogram was performed to confirm position and identified the origin of the urine artery. Once we confirmed  the origin, the same micro catheter and micro wire combination were used to select the uterine artery. The catheter was advanced to the distal descending right uterine artery. Angiogram was performed to confirm position. Embolization of the right uterine artery was performed with 1-1/3 vials of 500 -700 microm embospheres. Five beat stasis was achieved. The Waltman loop was reduced via the left common iliac artery with the assistance of the Bentson wire, and then the catheter and wire were removed. Angiogram of the right common femoral artery was performed. An Exoseal device was deployed for hemostasis. The patient tolerated the procedure well and remained hemodynamically stable throughout. No complications were encountered and no significant blood loss was encountered. IMPRESSION: Status post ultrasound guided access right common femoral artery for pelvic angiogram, superior hypogastric nerve block, and bilateral uterine artery embolization for symptomatic fibroids. ExoSeal deployed for hemostasis. Signed, Dulcy Fanny. Dellia Nims, [place] [facility] Radiology PLAN: The patient will be admitted for 1 night for observation. Advanced diet as tolerated. The patient should have her right leg straight for 3 hr given the right common femoral artery access. The patient will be treated with anti and nausea medicine  scheduled and as needed for breakthrough nausea. Toradol IV 30 mg Q 6 hr. A PCA pump has been administered for breakthrough pain. DC at 6 a.m. 04/04/2014. Hydration, with 150 cc/hour of normal saline for 8 hr. Removal of Foley catheter in the evening, or at 6 a.m. 04/04/2014. The patient will have a follow-up appointment in 2 - 4 weeks. Electronically Signed   By: Corrie Mckusick D.O.   On: 08/31/2019 17:05   IR Angiogram Selective Each Additional Vessel  Result Date: 08/31/2019 INDICATION: 54 year old female with a history of symptomatic uterine fibroids, presents for treatment with uterine artery embolization EXAM: ULTRASOUND GUIDED ACCESS RIGHT COMMON FEMORAL ARTERY IMAGE GUIDED SUPERIOR HYPOGASTRIC NERVE BLOCK PELVIC ANGIOGRAM AND THERAPEUTIC EMBOLIZATION OF BILATERAL UTERINE ARTERIES EXOSEAL FOR HEMOSTASIS MEDICATIONS: 4 g Zofran, 2 g Ancef. The antibiotic was administered within 1 hour of the procedure ANESTHESIA/SEDATION: Moderate (conscious) sedation was employed during this procedure. A total of Versed 4.0 mg and Fentanyl 200 mcg was administered intravenously. Moderate Sedation Time: 71 minutes. The patient's level of consciousness and vital signs were monitored continuously by radiology nursing throughout the procedure under my direct supervision. CONTRAST:  120 cc Omni FLUOROSCOPY TIME:  Fluoroscopy Time: 19 minutes 0 seconds (3448 mGy). COMPLICATIONS: None PROCEDURE: The procedure, risks, benefits, and alternatives were explained to the patient. Questions regarding the procedure were encouraged and answered. The patient understands and consents to the procedure. The right groin was prepped with Betadine in a sterile fashion, and a sterile drape was applied covering the operative field. A sterile gown and sterile gloves were used for the procedure. Local anesthesia was provided with 1% Lidocaine. Ultrasound survey of the right inguinal region was performed with images stored and sent to PACs. The skin  and subcutaneous tissue generously infiltrated with 1% lidocaine for local anesthesia, and a micropuncture needle was advanced under ultrasound guidance into the right common femoral artery. With excellent blood flow returned, micro wire was advanced. The needle was removed and a micropuncture set was advanced over the micro wire. The inner dilator in the stylet were removed, and an 035 Bentson wire was advanced into the abdominal aorta. The micro sheath was removed, a 5 [place] vascular sheath was placed into the common femoral artery. The dilator was removed and the sheath was flushed. A  5 [place] Cobra catheter was advanced over the Bentson wire and used to select the left common iliac artery. The Cobra catheter was advanced into the internal iliac artery. Once the catheter was over the bifurcation of the aorta, a superior hypogastric nerve block was performed via an anterior approach by directing a 22 gauge, 15 cm Chiba needle onto the anterior surface of L5 below the bifurcation of the aorta. Once the surface the needle was in contact with the vertebral body, the stylet was removed, small amount of contrast confirmed a prevertebral needle location, and a solution of contrast and ~20cc 0.5% ropivacaine was infused with fluoroscopy. Approximately 20 cc of ropivacaine was administered. The needle was removed. Angiogram of the left hypogastric artery was performed for identification of the left uterine artery. Once we identified the origin, a high-flow Renegade micro catheter was advanced to the Cobra catheter with a 0.014 Fathom wire. The micro catheter and micro wire combination were used to select the uterine artery. Angiogram was performed. Once we confirmed position the micro catheter within the distal descending left uterine artery, embolization was performed. Embolization of the left uterine artery was performed with 3 vial of embospheres (500 - 700 microm). Embolization was performed to 5 beat stasis. The the  micro wire/micro catheter were removed, and the Cobra catheter and Bentson wire were used to form a Waltman loop. The Waltman loop was then used to select the ipsilateral hypogastric artery. Angiogram was performed to confirm position and identified the origin of the urine artery. Once we confirmed the origin, the same micro catheter and micro wire combination were used to select the uterine artery. The catheter was advanced to the distal descending right uterine artery. Angiogram was performed to confirm position. Embolization of the right uterine artery was performed with 1-1/3 vials of 500 -700 microm embospheres. Five beat stasis was achieved. The Waltman loop was reduced via the left common iliac artery with the assistance of the Bentson wire, and then the catheter and wire were removed. Angiogram of the right common femoral artery was performed. An Exoseal device was deployed for hemostasis. The patient tolerated the procedure well and remained hemodynamically stable throughout. No complications were encountered and no significant blood loss was encountered. IMPRESSION: Status post ultrasound guided access right common femoral artery for pelvic angiogram, superior hypogastric nerve block, and bilateral uterine artery embolization for symptomatic fibroids. ExoSeal deployed for hemostasis. Signed, Dulcy Fanny. Dellia Nims, [place] [facility] Radiology PLAN: The patient will be admitted for 1 night for observation. Advanced diet as tolerated. The patient should have her right leg straight for 3 hr given the right common femoral artery access. The patient will be treated with anti and nausea medicine scheduled and as needed for breakthrough nausea. Toradol IV 30 mg Q 6 hr. A PCA pump has been administered for breakthrough pain. DC at 6 a.m. 04/04/2014. Hydration, with 150 cc/hour of normal saline for 8 hr. Removal of Foley catheter in the evening, or at 6 a.m. 04/04/2014.  The patient will have a follow-up appointment in 2 - 4 weeks. Electronically Signed   By: Corrie Mckusick D.O.   On: 08/31/2019 17:05   IR US Guide Vasc Access Right  Result Date: 08/31/2019 INDICATION: 54 year old female with a history of symptomatic uterine fibroids, presents for treatment with uterine artery embolization EXAM: ULTRASOUND GUIDED ACCESS RIGHT COMMON FEMORAL ARTERY IMAGE GUIDED SUPERIOR HYPOGASTRIC NERVE BLOCK PELVIC ANGIOGRAM AND THERAPEUTIC EMBOLIZATION OF BILATERAL UTERINE ARTERIES EXOSEAL FOR  HEMOSTASIS MEDICATIONS: 4 g Zofran, 2 g Ancef. The antibiotic was administered within 1 hour of the procedure ANESTHESIA/SEDATION: Moderate (conscious) sedation was employed during this procedure. A total of Versed 4.0 mg and Fentanyl 200 mcg was administered intravenously. Moderate Sedation Time: 71 minutes. The patient's level of consciousness and vital signs were monitored continuously by radiology nursing throughout the procedure under my direct supervision. CONTRAST:  120 cc Omni FLUOROSCOPY TIME:  Fluoroscopy Time: 19 minutes 0 seconds (3448 mGy). COMPLICATIONS: None PROCEDURE: The procedure, risks, benefits, and alternatives were explained to the patient. Questions regarding the procedure were encouraged and answered. The patient understands and consents to the procedure. The right groin was prepped with Betadine in a sterile fashion, and a sterile drape was applied covering the operative field. A sterile gown and sterile gloves were used for the procedure. Local anesthesia was provided with 1% Lidocaine. Ultrasound survey of the right inguinal region was performed with images stored and sent to PACs. The skin and subcutaneous tissue generously infiltrated with 1% lidocaine for local anesthesia, and a micropuncture needle was advanced under ultrasound guidance into the right common femoral artery. With excellent blood flow returned, micro wire was advanced. The needle was removed and a  micropuncture set was advanced over the micro wire. The inner dilator in the stylet were removed, and an 035 Bentson wire was advanced into the abdominal aorta. The micro sheath was removed, a 5 [place] vascular sheath was placed into the common femoral artery. The dilator was removed and the sheath was flushed. A 5 French Cobra catheter was advanced over the Bentson wire and used to select the left common iliac artery. The Cobra catheter was advanced into the internal iliac artery. Once the catheter was over the bifurcation of the aorta, a superior hypogastric nerve block was performed via an anterior approach by directing a 22 gauge, 15 cm Chiba needle onto the anterior surface of L5 below the bifurcation of the aorta. Once the surface the needle was in contact with the vertebral body, the stylet was removed, small amount of contrast confirmed a prevertebral needle location, and a solution of contrast and ~20cc 0.5% ropivacaine was infused with fluoroscopy. Approximately 20 cc of ropivacaine was administered. The needle was removed. Angiogram of the left hypogastric artery was performed for identification of the left uterine artery. Once we identified the origin, a high-flow Renegade micro catheter was advanced to the Cobra catheter with a 0.014 Fathom wire. The micro catheter and micro wire combination were used to select the uterine artery. Angiogram was performed. Once we confirmed position the micro catheter within the distal descending left uterine artery, embolization was performed. Embolization of the left uterine artery was performed with 3 vial of embospheres (500 - 700 microm). Embolization was performed to 5 beat stasis. The the micro wire/micro catheter were removed, and the Cobra catheter and Bentson wire were used to form a Waltman loop. The Waltman loop was then used to select the ipsilateral hypogastric artery. Angiogram was performed to confirm position and identified the origin of the urine  artery. Once we confirmed the origin, the same micro catheter and micro wire combination were used to select the uterine artery. The catheter was advanced to the distal descending right uterine artery. Angiogram was performed to confirm position. Embolization of the right uterine artery was performed with 1-1/3 vials of 500 -700 microm embospheres. Five beat stasis was achieved. The Waltman loop was reduced via the left common iliac artery with the assistance of  the Bentson wire, and then the catheter and wire were removed. Angiogram of the right common femoral artery was performed. An Exoseal device was deployed for hemostasis. The patient tolerated the procedure well and remained hemodynamically stable throughout. No complications were encountered and no significant blood loss was encountered. IMPRESSION: Status post ultrasound guided access right common femoral artery for pelvic angiogram, superior hypogastric nerve block, and bilateral uterine artery embolization for symptomatic fibroids. ExoSeal deployed for hemostasis. Signed, Dulcy Fanny. Dellia Nims, [place] [facility] Radiology PLAN: The patient will be admitted for 1 night for observation. Advanced diet as tolerated. The patient should have her right leg straight for 3 hr given the right common femoral artery access. The patient will be treated with anti and nausea medicine scheduled and as needed for breakthrough nausea. Toradol IV 30 mg Q 6 hr. A PCA pump has been administered for breakthrough pain. DC at 6 a.m. 04/04/2014. Hydration, with 150 cc/hour of normal saline for 8 hr. Removal of Foley catheter in the evening, or at 6 a.m. 04/04/2014. The patient will have a follow-up appointment in 2 - 4 weeks. Electronically Signed   By: Corrie Mckusick D.O.   On: 08/31/2019 17:05   IR Fluoro Guide Ndl Plmt / BX  Result Date: 08/31/2019 INDICATION: 54 year old female with a history of symptomatic uterine fibroids,  presents for treatment with uterine artery embolization EXAM: ULTRASOUND GUIDED ACCESS RIGHT COMMON FEMORAL ARTERY IMAGE GUIDED SUPERIOR HYPOGASTRIC NERVE BLOCK PELVIC ANGIOGRAM AND THERAPEUTIC EMBOLIZATION OF BILATERAL UTERINE ARTERIES EXOSEAL FOR HEMOSTASIS MEDICATIONS: 4 g Zofran, 2 g Ancef. The antibiotic was administered within 1 hour of the procedure ANESTHESIA/SEDATION: Moderate (conscious) sedation was employed during this procedure. A total of Versed 4.0 mg and Fentanyl 200 mcg was administered intravenously. Moderate Sedation Time: 71 minutes. The patient's level of consciousness and vital signs were monitored continuously by radiology nursing throughout the procedure under my direct supervision. CONTRAST:  120 cc Omni FLUOROSCOPY TIME:  Fluoroscopy Time: 19 minutes 0 seconds (3448 mGy). COMPLICATIONS: None PROCEDURE: The procedure, risks, benefits, and alternatives were explained to the patient. Questions regarding the procedure were encouraged and answered. The patient understands and consents to the procedure. The right groin was prepped with Betadine in a sterile fashion, and a sterile drape was applied covering the operative field. A sterile gown and sterile gloves were used for the procedure. Local anesthesia was provided with 1% Lidocaine. Ultrasound survey of the right inguinal region was performed with images stored and sent to PACs. The skin and subcutaneous tissue generously infiltrated with 1% lidocaine for local anesthesia, and a micropuncture needle was advanced under ultrasound guidance into the right common femoral artery. With excellent blood flow returned, micro wire was advanced. The needle was removed and a micropuncture set was advanced over the micro wire. The inner dilator in the stylet were removed, and an 035 Bentson wire was advanced into the abdominal aorta. The micro sheath was removed, a 5 [place] vascular sheath was placed into the common femoral artery. The dilator was removed  and the sheath was flushed. A 5 French Cobra catheter was advanced over the Bentson wire and used to select the left common iliac artery. The Cobra catheter was advanced into the internal iliac artery. Once the catheter was over the bifurcation of the aorta, a superior hypogastric nerve block was performed via an anterior approach by directing a 22 gauge, 15 cm Chiba needle onto the anterior surface of L5 below the bifurcation of  the aorta. Once the surface the needle was in contact with the vertebral body, the stylet was removed, small amount of contrast confirmed a prevertebral needle location, and a solution of contrast and ~20cc 0.5% ropivacaine was infused with fluoroscopy. Approximately 20 cc of ropivacaine was administered. The needle was removed. Angiogram of the left hypogastric artery was performed for identification of the left uterine artery. Once we identified the origin, a high-flow Renegade micro catheter was advanced to the Cobra catheter with a 0.014 Fathom wire. The micro catheter and micro wire combination were used to select the uterine artery. Angiogram was performed. Once we confirmed position the micro catheter within the distal descending left uterine artery, embolization was performed. Embolization of the left uterine artery was performed with 3 vial of embospheres (500 - 700 microm). Embolization was performed to 5 beat stasis. The the micro wire/micro catheter were removed, and the Cobra catheter and Bentson wire were used to form a Waltman loop. The Waltman loop was then used to select the ipsilateral hypogastric artery. Angiogram was performed to confirm position and identified the origin of the urine artery. Once we confirmed the origin, the same micro catheter and micro wire combination were used to select the uterine artery. The catheter was advanced to the distal descending right uterine artery. Angiogram was performed to confirm position. Embolization of the right uterine artery was  performed with 1-1/3 vials of 500 -700 microm embospheres. Five beat stasis was achieved. The Waltman loop was reduced via the left common iliac artery with the assistance of the Bentson wire, and then the catheter and wire were removed. Angiogram of the right common femoral artery was performed. An Exoseal device was deployed for hemostasis. The patient tolerated the procedure well and remained hemodynamically stable throughout. No complications were encountered and no significant blood loss was encountered. IMPRESSION: Status post ultrasound guided access right common femoral artery for pelvic angiogram, superior hypogastric nerve block, and bilateral uterine artery embolization for symptomatic fibroids. ExoSeal deployed for hemostasis. Signed, Dulcy Fanny. Dellia Nims, [place] [facility] Radiology PLAN: The patient will be admitted for 1 night for observation. Advanced diet as tolerated. The patient should have her right leg straight for 3 hr given the right common femoral artery access. The patient will be treated with anti and nausea medicine scheduled and as needed for breakthrough nausea. Toradol IV 30 mg Q 6 hr. A PCA pump has been administered for breakthrough pain. DC at 6 a.m. 04/04/2014. Hydration, with 150 cc/hour of normal saline for 8 hr. Removal of Foley catheter in the evening, or at 6 a.m. 04/04/2014. The patient will have a follow-up appointment in 2 - 4 weeks. Electronically Signed   By: Corrie Mckusick D.O.   On: 08/31/2019 17:05   IR EMBO TUMOR ORGAN ISCHEMIA INFARCT INC GUIDE ROADMAPPING  Result Date: 08/31/2019 INDICATION: 54 year old female with a history of symptomatic uterine fibroids, presents for treatment with uterine artery embolization EXAM: ULTRASOUND GUIDED ACCESS RIGHT COMMON FEMORAL ARTERY IMAGE GUIDED SUPERIOR HYPOGASTRIC NERVE BLOCK PELVIC ANGIOGRAM AND THERAPEUTIC EMBOLIZATION OF BILATERAL UTERINE ARTERIES EXOSEAL FOR HEMOSTASIS  MEDICATIONS: 4 g Zofran, 2 g Ancef. The antibiotic was administered within 1 hour of the procedure ANESTHESIA/SEDATION: Moderate (conscious) sedation was employed during this procedure. A total of Versed 4.0 mg and Fentanyl 200 mcg was administered intravenously. Moderate Sedation Time: 71 minutes. The patient's level of consciousness and vital signs were monitored continuously by radiology nursing throughout the procedure under my direct supervision.  CONTRAST:  120 cc Omni FLUOROSCOPY TIME:  Fluoroscopy Time: 19 minutes 0 seconds (3448 mGy). COMPLICATIONS: None PROCEDURE: The procedure, risks, benefits, and alternatives were explained to the patient. Questions regarding the procedure were encouraged and answered. The patient understands and consents to the procedure. The right groin was prepped with Betadine in a sterile fashion, and a sterile drape was applied covering the operative field. A sterile gown and sterile gloves were used for the procedure. Local anesthesia was provided with 1% Lidocaine. Ultrasound survey of the right inguinal region was performed with images stored and sent to PACs. The skin and subcutaneous tissue generously infiltrated with 1% lidocaine for local anesthesia, and a micropuncture needle was advanced under ultrasound guidance into the right common femoral artery. With excellent blood flow returned, micro wire was advanced. The needle was removed and a micropuncture set was advanced over the micro wire. The inner dilator in the stylet were removed, and an 035 Bentson wire was advanced into the abdominal aorta. The micro sheath was removed, a 5 [place] vascular sheath was placed into the common femoral artery. The dilator was removed and the sheath was flushed. A 5 French Cobra catheter was advanced over the Bentson wire and used to select the left common iliac artery. The Cobra catheter was advanced into the internal iliac artery. Once the catheter was over the bifurcation of the aorta,  a superior hypogastric nerve block was performed via an anterior approach by directing a 22 gauge, 15 cm Chiba needle onto the anterior surface of L5 below the bifurcation of the aorta. Once the surface the needle was in contact with the vertebral body, the stylet was removed, small amount of contrast confirmed a prevertebral needle location, and a solution of contrast and ~20cc 0.5% ropivacaine was infused with fluoroscopy. Approximately 20 cc of ropivacaine was administered. The needle was removed. Angiogram of the left hypogastric artery was performed for identification of the left uterine artery. Once we identified the origin, a high-flow Renegade micro catheter was advanced to the Cobra catheter with a 0.014 Fathom wire. The micro catheter and micro wire combination were used to select the uterine artery. Angiogram was performed. Once we confirmed position the micro catheter within the distal descending left uterine artery, embolization was performed. Embolization of the left uterine artery was performed with 3 vial of embospheres (500 - 700 microm). Embolization was performed to 5 beat stasis. The the micro wire/micro catheter were removed, and the Cobra catheter and Bentson wire were used to form a Waltman loop. The Waltman loop was then used to select the ipsilateral hypogastric artery. Angiogram was performed to confirm position and identified the origin of the urine artery. Once we confirmed the origin, the same micro catheter and micro wire combination were used to select the uterine artery. The catheter was advanced to the distal descending right uterine artery. Angiogram was performed to confirm position. Embolization of the right uterine artery was performed with 1-1/3 vials of 500 -700 microm embospheres. Five beat stasis was achieved. The Waltman loop was reduced via the left common iliac artery with the assistance of the Bentson wire, and then the catheter and wire were removed. Angiogram of the  right common femoral artery was performed. An Exoseal device was deployed for hemostasis. The patient tolerated the procedure well and remained hemodynamically stable throughout. No complications were encountered and no significant blood loss was encountered. IMPRESSION: Status post ultrasound guided access right common femoral artery for pelvic angiogram, superior hypogastric nerve block,  and bilateral uterine artery embolization for symptomatic fibroids. ExoSeal deployed for hemostasis. Signed, Dulcy Fanny. Dellia Nims, [place] [facility] Radiology PLAN: The patient will be admitted for 1 night for observation. Advanced diet as tolerated. The patient should have her right leg straight for 3 hr given the right common femoral artery access. The patient will be treated with anti and nausea medicine scheduled and as needed for breakthrough nausea. Toradol IV 30 mg Q 6 hr. A PCA pump has been administered for breakthrough pain. DC at 6 a.m. 04/04/2014. Hydration, with 150 cc/hour of normal saline for 8 hr. Removal of Foley catheter in the evening, or at 6 a.m. 04/04/2014. The patient will have a follow-up appointment in 2 - 4 weeks. Electronically Signed   By: Corrie Mckusick D.O.   On: 08/31/2019 17:05    Labs:  CBC: Recent Labs    08/31/19 1215  WBC 3.5*  HGB 11.2*  HCT 35.2*  PLT 241    COAGS: Recent Labs    08/31/19 1215  INR 1.0    BMP: Recent Labs    11/02/18 1206 03/12/19 1235 08/31/19 1215  NA 143 141 141  K 3.0* 3.4* 3.7  CL 102 101 104  CO2 23 23 28   GLUCOSE 91 90 85  BUN 10 10 13   CALCIUM 9.6 9.4 9.3  CREATININE 0.84 0.79 1.01*  GFRNONAA 81 87 >60  GFRAA 94 100 >60    LIVER FUNCTION TESTS: No results for input(s): BILITOT, AST, ALT, ALKPHOS, PROT, ALBUMIN in the last 8760 hours.  TUMOR MARKERS: No results for input(s): AFPTM, CEA, CA199, CHROMGRNA in the last 8760 hours.  Assessment and Plan:  Mackenzie Clark is a 54 year old  female SP treatment of symptomatic uterine fibroids with uterine artery embolization, with superior hypogastric nerve block.    She fells that she has recovered at this point, and she has gone back to work as of 1 week ago, comfortably.   She did ask me about some "hot flashes" that she has been having, associated with some sweatiness, but no rigors, chills, or other concern for infection.  I do not think these are secondary to an infection, and instead may be manifestation of post-embolization syndrome, or irritation of the ovaries with possible non-target embolization.  We discussed expectant management, and that we can look further into cause if the symptom persists and is burdensome.   I did emphasize our typical follow up surveillance, which usually at this point is a 5-6 month office visit.  I think it is safe to maintain this schedule given our discussion, and she agrees.    Plan: - 5-6 month routine follow up with Dr. Earleen Newport with office visit.  No imaging needed.  If she has any concerns in the meantime, we are happy to see her back. - I have encouraged her to observe her other physician appointments     Electronically Signed: Corrie Mckusick 09/18/2019, 1:18 PM   I spent a total of    25 Minutes in remote  clinical consultation, greater than 50% of which was counseling/coordinating care for symptomatic uterine fibroids, status post [place]. Marland Kitchen    Visit type: Audio only (telephone). Audio (no video) only due to patient's lack of internet/smartphone capability. Alternative for in-person consultation at [facility], [address] [place], [place], [place]. This visit type was conducted due to national recommendations for restrictions regarding the COVID-19 Pandemic (e.g. social distancing).  This format is felt to be  most appropriate for this patient at this time.  All issues noted in this document were discussed and addressed.

## 2019-09-25 ENCOUNTER — Ambulatory Visit: Payer: No Typology Code available for payment source | Admitting: Internal Medicine

## 2019-09-29 ENCOUNTER — Ambulatory Visit: Payer: No Typology Code available for payment source | Attending: Internal Medicine

## 2019-09-29 DIAGNOSIS — Z23 Encounter for immunization: Secondary | ICD-10-CM

## 2019-09-29 NOTE — Progress Notes (Signed)
   Covid-19 Vaccination Clinic  Name:  Mackenzie Clark    MRN: [id number] DOB: 02-Aug-1965  09/29/2019  Ms. Mackenzie Clark was observed post Covid-19 immunization for 15 minutes without incident. She was provided with Vaccine Information Sheet and instruction to access the V-Safe system.   Ms. Mackenzie Clark was instructed to call 911 with any severe reactions post vaccine: Marland Kitchen Difficulty breathing  . Swelling of face and throat  . A fast heartbeat  . A bad rash all over body  . Dizziness and weakness   Immunizations Administered    Name Date Dose VIS Date Route   JANSSEN COVID-19 VACCINE 09/29/2019 10:28 AM 0.5 mL 06/02/2019 Intramuscular   Manufacturer: [employer]   Lot: [id number]   [place]: [id number]

## 2019-10-02 MED FILL — TRULICITY 1.5 MG/0.5 ML PEN: 1.5 | 84 days supply | Qty: 6 | Fill #1

## 2019-10-02 MED FILL — NORETHINDRONE 0.35 MG TABS: 0.35 | 84 days supply | Qty: 84 | Fill #1

## 2019-10-10 ENCOUNTER — Other Ambulatory Visit: Payer: Self-pay

## 2019-10-10 ENCOUNTER — Ambulatory Visit (INDEPENDENT_AMBULATORY_CARE_PROVIDER_SITE_OTHER): Payer: No Typology Code available for payment source | Admitting: Internal Medicine

## 2019-10-10 ENCOUNTER — Encounter: Payer: Self-pay | Admitting: Internal Medicine

## 2019-10-10 VITALS — BP 120/86 | HR 84 | Temp 98.0°F | Ht 67.0 in | Wt 202.2 lb

## 2019-10-10 DIAGNOSIS — E785 Hyperlipidemia, unspecified: Secondary | ICD-10-CM | POA: Diagnosis not present

## 2019-10-10 DIAGNOSIS — I10 Essential (primary) hypertension: Secondary | ICD-10-CM | POA: Diagnosis not present

## 2019-10-10 DIAGNOSIS — E6609 Other obesity due to excess calories: Secondary | ICD-10-CM | POA: Diagnosis not present

## 2019-10-10 DIAGNOSIS — Z6831 Body mass index (BMI) 31.0-31.9, adult: Secondary | ICD-10-CM

## 2019-10-10 DIAGNOSIS — E1169 Type 2 diabetes mellitus with other specified complication: Secondary | ICD-10-CM | POA: Diagnosis not present

## 2019-10-10 MED ORDER — OLMESARTAN MEDOXOMIL-HCTZ 40-25 MG PO TABS: 1.0000 | ORAL_TABLET | Freq: Every day | ORAL | 1 refills | Status: DC

## 2019-10-10 MED FILL — OLMESARTAN-HCTZ 40-25 MG TA: 40-25 | 90 days supply | Qty: 90 | Fill #0

## 2019-10-11 LAB — HEMOGLOBIN A1C
Est. average glucose Bld gHb Est-mCnc: 123 mg/dL
Hgb A1c MFr Bld: 5.9 % — ABNORMAL HIGH (ref 4.8–5.6)

## 2019-10-18 NOTE — Progress Notes (Signed)
This visit occurred during the SARS-CoV-2 public health emergency.  Safety protocols were in place, including screening questions prior to the visit, additional usage of staff PPE, and extensive cleaning of exam room while observing appropriate contact time as indicated for disinfecting solutions.  Subjective:     Patient ID: Mackenzie Clark , female    DOB: [birth year] , 54 y.o.   MRN: [id number]   Chief Complaint  Patient presents with  . Hypertension    She presents today for BP/DM check. Reports compliance with meds. Has no new concerns.  Hypertension This is a chronic problem. The current episode started more than 1 year ago. The problem has been gradually improving since onset. The problem is controlled. Pertinent negatives include no blurred vision or chest pain.  Diabetes She presents for her follow-up diabetic visit. She has type 2 diabetes mellitus. Her disease course has been stable. There are no hypoglycemic associated symptoms. Pertinent negatives for diabetes include no blurred vision and no chest pain. There are no hypoglycemic complications. Risk factors for coronary artery disease include diabetes mellitus, hypertension and obesity. She is compliant with treatment most of the time. She participates in exercise intermittently. An ACE inhibitor/angiotensin II receptor blocker is being taken. Eye exam is current.     Past Medical History:  Diagnosis Date  . Allergy   . Diabetes mellitus without complication (HCC)    diet and exercise controlled  . Hypertension      Family History  Problem Relation Age of Onset  . Hypertension Mother   . Hyperlipidemia Mother   . Hypertension Father   . Diabetes Father   . Heart disease Father        heart attack age 58  . Hypertension Brother   . Cancer Maternal Grandmother 109       Breast  . Obesity Other      Current Outpatient Medications:  .  cetirizine (ZYRTEC ALLERGY) 10 MG tablet, Take 1 tablet (10 mg total) by mouth  daily. (Patient taking differently: Take 10 mg by mouth daily as needed for allergies. ), Disp: 90 tablet, Rfl: 2 .  Dulaglutide (TRULICITY) 1.5 QQ/2.2LN SOPN, Inject 1.5 mg into the skin once a week., Disp: 12 pen, Rfl: 1 .  glucose blood (ACCU-CHEK AVIVA PLUS) test strip, 1 each by Other route 2 (two) times daily. Use as instructed, Disp: , Rfl:  .  metFORMIN (GLUCOPHAGE) 500 MG tablet, Take 1 tablet (500 mg total) by mouth 2 (two) times daily with a meal., Disp: 180 tablet, Rfl: 2 .  mometasone (NASONEX) 50 MCG/ACT nasal spray, Place 2 sprays into the nose daily., Disp: 17 g, Rfl: 2 .  olmesartan-hydrochlorothiazide (BENICAR HCT) 40-25 MG tablet, Take 1 tablet by mouth daily., Disp: 90 tablet, Rfl: 1 .  Naltrexone-buPROPion HCl ER (CONTRAVE) 8-90 MG TB12, 2 tab po bid, Disp: 120 tablet, Rfl: 5 .  norethindrone (MICRONOR) 0.35 MG tablet, Take 1 tablet (0.35 mg total) by mouth daily. (Patient not taking: Reported on 10/10/2019), Disp: 84 tablet, Rfl: 3 .  valACYclovir (VALTREX) 1000 MG tablet, Take 1 tablet (1,000 mg total) by mouth as needed. (Patient not taking: Reported on 10/10/2019), Disp: 90 tablet, Rfl: 1   No Known Allergies   Review of Systems  Constitutional: Negative.   Eyes: Negative for blurred vision.  Respiratory: Negative.   Cardiovascular: Negative.  Negative for chest pain.  Gastrointestinal: Negative.   Neurological: Negative.   Psychiatric/Behavioral: Negative.      Today's Vitals  10/10/19 1637  BP: 120/86  Pulse: 84  Temp: 98 F (36.7 C)  TempSrc: Oral  Weight: 202 lb 3.2 oz (91.7 kg)  Height: 5\' 7"  (1.702 m)   Body mass index is 31.67 kg/m.   Wt Readings from Last 3 Encounters:  10/10/19 202 lb 3.2 oz (91.7 kg)  08/31/19 203 lb (92.1 kg)  06/27/19 205 lb (93 kg)     Objective:  Physical Exam Vitals and nursing note reviewed.  HENT:     Head: Normocephalic and atraumatic.  Cardiovascular:     Rate and Rhythm: Normal rate and regular rhythm.      Heart sounds: Normal heart sounds.  Pulmonary:     Effort: Pulmonary effort is normal.  Musculoskeletal:     Cervical back: Normal range of motion.  Neurological:     General: No focal deficit present.     Mental Status: She is alert and oriented to person, place, and time.  Psychiatric:        Mood and Affect: Mood and affect normal.         Assessment And Plan:     1. Essential hypertension, benign  Comments: Chronic, well controlled. She will continue with current meds. Encouraged to avoid adding salt to her foods.   - olmesartan-hydrochlorothiazide (BENICAR HCT) 40-25 MG tablet; Take 1 tablet by mouth daily.  Dispense: 90 tablet; Refill: 1  2. Dyslipidemia associated with type 2 diabetes mellitus ([place])  Comments: Previous LDL reviewed, 111 in Dec 2020. She is aware that goal LDL is < 70. She prefers to work on diet/exercise prior to starting meds. Will recheck at her next visit. I will check an a1c today. Renal function checked within past 90 days, will not repeat today. We will also consider increasing the dose of Trulicity once she runs out of her current supply of metformin. This should help her to achieve her weight loss goals.   - Hemoglobin A1c  3. Class 1 obesity due to excess calories with serious comorbidity and body mass index (BMI) of 31.0 to 31.9 in adult  She is encouraged to strive for BMI less than 28 to decrease cardiac risk. Advised to aim for at least 150 minutes of exercise per week.  [name], MD    THE PATIENT IS ENCOURAGED TO PRACTICE SOCIAL DISTANCING AND TO CONTINUE TO WEAR MASKS.

## 2019-10-18 NOTE — Progress Notes (Deleted)
  This visit occurred during the SARS-CoV-2 public health emergency.  Safety protocols were in place, including screening questions prior to the visit, additional usage of staff PPE, and extensive cleaning of exam room while observing appropriate contact time as indicated for disinfecting solutions.  Subjective:     Patient ID: Mackenzie Clark , female    DOB: 1965-10-28 , 54 y.o.   MRN: [id number]   Chief Complaint  Patient presents with  . Hypertension    HPI  HPI   Past Medical History:  Diagnosis Date  . Allergy   . Diabetes mellitus without complication (HCC)    diet and exercise controlled  . Hypertension      Family History  Problem Relation Age of Onset  . Hypertension Mother   . Hyperlipidemia Mother   . Hypertension Father   . Diabetes Father   . Heart disease Father        heart attack age 79  . Hypertension Brother   . Cancer Maternal Grandmother 60       Breast  . Obesity Other      Current Outpatient Medications:  .  cetirizine (ZYRTEC ALLERGY) 10 MG tablet, Take 1 tablet (10 mg total) by mouth daily. (Patient taking differently: Take 10 mg by mouth daily as needed for allergies. ), Disp: 90 tablet, Rfl: 2 .  Dulaglutide (TRULICITY) 1.5 LX/7.2IO SOPN, Inject 1.5 mg into the skin once a week., Disp: 12 pen, Rfl: 1 .  glucose blood (ACCU-CHEK AVIVA PLUS) test strip, 1 each by Other route 2 (two) times daily. Use as instructed, Disp: , Rfl:  .  metFORMIN (GLUCOPHAGE) 500 MG tablet, Take 1 tablet (500 mg total) by mouth 2 (two) times daily with a meal., Disp: 180 tablet, Rfl: 2 .  mometasone (NASONEX) 50 MCG/ACT nasal spray, Place 2 sprays into the nose daily., Disp: 17 g, Rfl: 2 .  olmesartan-hydrochlorothiazide (BENICAR HCT) 40-25 MG tablet, Take 1 tablet by mouth daily., Disp: 90 tablet, Rfl: 1 .  Naltrexone-buPROPion HCl ER (CONTRAVE) 8-90 MG TB12, 2 tab po bid, Disp: 120 tablet, Rfl: 5 .  norethindrone (MICRONOR) 0.35 MG tablet, Take 1 tablet (0.35 mg  total) by mouth daily. (Patient not taking: Reported on 10/10/2019), Disp: 84 tablet, Rfl: 3 .  valACYclovir (VALTREX) 1000 MG tablet, Take 1 tablet (1,000 mg total) by mouth as needed. (Patient not taking: Reported on 10/10/2019), Disp: 90 tablet, Rfl: 1   No Known Allergies   Review of Systems   Today's Vitals   10/10/19 1637  BP: 120/86  Pulse: 84  Temp: 98 F (36.7 C)  TempSrc: Oral  Weight: 202 lb 3.2 oz (91.7 kg)  Height: 5\' 7"  (1.702 m)   Body mass index is 31.67 kg/m.   Wt Readings from Last 3 Encounters:  10/10/19 202 lb 3.2 oz (91.7 kg)  08/31/19 203 lb (92.1 kg)  06/27/19 205 lb (93 kg)     Objective:  Physical Exam      Assessment And Plan:     1. Essential hypertension, benign ***  2. Lipidosis due to DM ([place]) ***      ***  [name], MD    THE PATIENT IS ENCOURAGED TO PRACTICE SOCIAL

## 2019-10-28 NOTE — Patient Instructions (Signed)
Diabetes Mellitus and Exercise Exercising regularly is important for your overall health, especially when you have diabetes (diabetes mellitus). Exercising is not only about losing weight. It has many other health benefits, such as increasing muscle strength and bone density and reducing body fat and stress. This leads to improved fitness, flexibility, and endurance, all of which result in better overall health. Exercise has additional benefits for people with diabetes, including:  Reducing appetite.  Helping to lower and control blood glucose.  Lowering blood pressure.  Helping to control amounts of fatty substances (lipids) in the blood, such as cholesterol and triglycerides.  Helping the body to respond better to insulin (improving insulin sensitivity).  Reducing how much insulin the body needs.  Decreasing the risk for heart disease by: ? Lowering cholesterol and triglyceride levels. ? Increasing the levels of good cholesterol. ? Lowering blood glucose levels. What is my activity plan? Your health care provider or certified diabetes educator can help you make a plan for the type and frequency of exercise (activity plan) that works for you. Make sure that you:  Do at least 150 minutes of moderate-intensity or vigorous-intensity exercise each week. This could be brisk walking, biking, or water aerobics. ? Do stretching and strength exercises, such as yoga or weightlifting, at least 2 times a week. ? Spread out your activity over at least 3 days of the week.  Get some form of physical activity every day. ? Do not go more than 2 days in a row without some kind of physical activity. ? Avoid being inactive for more than 30 minutes at a time. Take frequent breaks to walk or stretch.  Choose a type of exercise or activity that you enjoy, and set realistic goals.  Start slowly, and gradually increase the intensity of your exercise over time. What do I need to know about managing my  diabetes?   Check your blood glucose before and after exercising. ? If your blood glucose is 240 mg/dL (13.3 mmol/L) or higher before you exercise, check your urine for ketones. If you have ketones in your urine, do not exercise until your blood glucose returns to normal. ? If your blood glucose is 100 mg/dL (5.6 mmol/L) or lower, eat a snack containing 15-20 grams of carbohydrate. Check your blood glucose 15 minutes after the snack to make sure that your level is above 100 mg/dL (5.6 mmol/L) before you start your exercise.  Know the symptoms of low blood glucose (hypoglycemia) and how to treat it. Your risk for hypoglycemia increases during and after exercise. Common symptoms of hypoglycemia can include: ? Hunger. ? Anxiety. ? Sweating and feeling clammy. ? Confusion. ? Dizziness or feeling light-headed. ? Increased heart rate or palpitations. ? Blurry vision. ? Tingling or numbness around the mouth, lips, or tongue. ? Tremors or shakes. ? Irritability.  Keep a rapid-acting carbohydrate snack available before, during, and after exercise to help prevent or treat hypoglycemia.  Avoid injecting insulin into areas of the body that are going to be exercised. For example, avoid injecting insulin into: ? The arms, when playing tennis. ? The legs, when jogging.  Keep records of your exercise habits. Doing this can help you and your health care provider adjust your diabetes management plan as needed. Write down: ? Food that you eat before and after you exercise. ? Blood glucose levels before and after you exercise. ? The type and amount of exercise you have done. ? When your insulin is expected to peak, if you use   insulin. Avoid exercising at times when your insulin is peaking.  When you start a new exercise or activity, work with your health care provider to make sure the activity is safe for you, and to adjust your insulin, medicines, or food intake as needed.  Drink plenty of water while  you exercise to prevent dehydration or heat stroke. Drink enough fluid to keep your urine clear or pale yellow. Summary  Exercising regularly is important for your overall health, especially when you have diabetes (diabetes mellitus).  Exercising has many health benefits, such as increasing muscle strength and bone density and reducing body fat and stress.  Your health care provider or certified diabetes educator can help you make a plan for the type and frequency of exercise (activity plan) that works for you.  When you start a new exercise or activity, work with your health care provider to make sure the activity is safe for you, and to adjust your insulin, medicines, or food intake as needed. This information is not intended to replace advice given to you by your health care provider. Make sure you discuss any questions you have with your health care provider. Document Revised: 10/14/2016 Document Reviewed: 09/01/2015 Elsevier Patient Education  2020 [employer]  

## 2019-11-05 LAB — HM DIABETES EYE EXAM

## 2019-11-06 ENCOUNTER — Encounter: Payer: Self-pay | Admitting: Internal Medicine

## 2019-11-19 ENCOUNTER — Other Ambulatory Visit: Payer: Self-pay

## 2019-11-20 ENCOUNTER — Telehealth: Payer: Self-pay

## 2019-11-20 NOTE — Telephone Encounter (Signed)
Lvm. Pt needs to be informed that PA for contrave was approved.

## 2019-11-27 MED FILL — CONTRAVE ER 8-90 MG TABLET: 8-90 | 30 days supply | Qty: 120 | Fill #2

## 2019-11-27 MED FILL — MOMETASONE FUROATE 50 MCG S: 50 | 30 days supply | Qty: 17 | Fill #1

## 2019-12-18 ENCOUNTER — Encounter: Payer: Self-pay | Admitting: Gastroenterology

## 2020-01-08 MED FILL — OLMESARTAN-HCTZ 40-25 MG TA: 40-25 | 90 days supply | Qty: 90 | Fill #1

## 2020-01-08 MED FILL — CONTRAVE ER 8-90 MG TABLET: 8-90 | 30 days supply | Qty: 120 | Fill #3

## 2020-01-22 ENCOUNTER — Other Ambulatory Visit: Payer: Self-pay

## 2020-01-22 ENCOUNTER — Encounter: Payer: Self-pay | Admitting: Internal Medicine

## 2020-01-22 ENCOUNTER — Other Ambulatory Visit: Payer: Self-pay | Admitting: Nurse Practitioner

## 2020-01-22 ENCOUNTER — Other Ambulatory Visit: Payer: Self-pay | Admitting: Internal Medicine

## 2020-01-22 DIAGNOSIS — R7303 Prediabetes: Secondary | ICD-10-CM

## 2020-01-22 MED ORDER — TRULICITY 3 MG/0.5ML ~~LOC~~ SOAJ
3.0000 mg | SUBCUTANEOUS | 3 refills | Status: DC
Start: 2020-01-22 — End: 2020-01-22

## 2020-01-22 MED FILL — TRULICITY 3 MG/0.5ML SOPN: 3 | 84 days supply | Qty: 6 | Fill #0

## 2020-01-24 ENCOUNTER — Other Ambulatory Visit: Payer: Self-pay | Admitting: Interventional Radiology

## 2020-01-24 ENCOUNTER — Encounter: Payer: Self-pay | Admitting: Internal Medicine

## 2020-01-24 DIAGNOSIS — D250 Submucous leiomyoma of uterus: Secondary | ICD-10-CM

## 2020-01-25 ENCOUNTER — Other Ambulatory Visit: Payer: Self-pay | Admitting: Internal Medicine

## 2020-01-25 DIAGNOSIS — Z1211 Encounter for screening for malignant neoplasm of colon: Secondary | ICD-10-CM

## 2020-02-01 ENCOUNTER — Other Ambulatory Visit: Payer: Self-pay

## 2020-02-01 ENCOUNTER — Other Ambulatory Visit: Payer: Self-pay | Admitting: Gastroenterology

## 2020-02-01 ENCOUNTER — Ambulatory Visit (AMBULATORY_SURGERY_CENTER): Payer: Self-pay | Admitting: *Deleted

## 2020-02-01 VITALS — Ht 67.0 in | Wt 191.0 lb

## 2020-02-01 DIAGNOSIS — Z1211 Encounter for screening for malignant neoplasm of colon: Secondary | ICD-10-CM

## 2020-02-01 MED ORDER — SUPREP BOWEL PREP KIT 17.5-3.13-1.6 GM/177ML PO SOLN: 1.0000 | Freq: Once | ORAL | 0 refills | Status: DC

## 2020-02-01 MED FILL — SUPREP BOWEL PREP KIT: 17.5-3.13-1 | 2 days supply | Qty: 354 | Fill #0

## 2020-02-01 NOTE — Progress Notes (Signed)
J and J x 1  No egg or soy allergy known to patient  No issues with past sedation with any surgeries or procedures no intubation problems in the past  No FH of Malignant Hyperthermia No diet pills per patient No home 02 use per patient  No blood thinners per patient  Pt states issues with constipation - uses occ stool softener or Miralax but not daily - states constipation is hit or miss  No A fib or A flutter  EMMI video to pt or via Tensed 19 guidelines implemented in PV today with Pt and RN    Due to the COVID-19 pandemic we are asking patients to follow these guidelines. Please only bring one care partner. Please be aware that your care partner may wait in the car in the parking lot or if they feel like they will be too hot to wait in the car, they may wait in the lobby on the 4th floor. All care partners are required to wear a mask the entire time (we do not have any that we can provide them), they need to practice social distancing, and we will do a Covid check for all patient's and care partners when you arrive. Also we will check their temperature and your temperature. If the care partner waits in their car they need to stay in the parking lot the entire time and we will call them on their cell phone when the patient is ready for discharge so they can bring the car to the front of the building. Also all patient's will need to wear a mask into building.

## 2020-02-02 LAB — HM MAMMOGRAPHY

## 2020-02-06 ENCOUNTER — Encounter: Payer: Self-pay | Admitting: Internal Medicine

## 2020-02-07 ENCOUNTER — Encounter: Payer: Self-pay | Admitting: Gastroenterology

## 2020-02-12 ENCOUNTER — Other Ambulatory Visit: Payer: Self-pay | Admitting: Internal Medicine

## 2020-02-12 DIAGNOSIS — I10 Essential (primary) hypertension: Secondary | ICD-10-CM

## 2020-02-13 ENCOUNTER — Other Ambulatory Visit: Payer: Self-pay | Admitting: Internal Medicine

## 2020-02-15 ENCOUNTER — Ambulatory Visit (AMBULATORY_SURGERY_CENTER): Payer: No Typology Code available for payment source | Admitting: Gastroenterology

## 2020-02-15 ENCOUNTER — Encounter: Payer: Self-pay | Admitting: Gastroenterology

## 2020-02-15 ENCOUNTER — Other Ambulatory Visit: Payer: Self-pay

## 2020-02-15 VITALS — BP 134/88 | HR 66 | Temp 97.6°F | Resp 13 | Ht 67.0 in | Wt 191.0 lb

## 2020-02-15 DIAGNOSIS — D123 Benign neoplasm of transverse colon: Secondary | ICD-10-CM | POA: Diagnosis not present

## 2020-02-15 DIAGNOSIS — K6289 Other specified diseases of anus and rectum: Secondary | ICD-10-CM | POA: Diagnosis not present

## 2020-02-15 DIAGNOSIS — Z1211 Encounter for screening for malignant neoplasm of colon: Secondary | ICD-10-CM | POA: Diagnosis not present

## 2020-02-15 MED ORDER — SODIUM CHLORIDE 0.9 % IV SOLN: 500.0000 mL | Freq: Once | INTRAVENOUS | Status: DC

## 2020-02-15 MED ORDER — DEXTROSE 5 % IV SOLN: INTRAVENOUS | Status: DC

## 2020-02-15 NOTE — Progress Notes (Signed)
[facility]- [place]  Blood sugar- 72.  She states she doesn't feel shaky, "just beginning to get a headache,"  D5 IVF hung at [facility]  Pt's states no medical or surgical changes since previsit or office visit.

## 2020-02-15 NOTE — Op Note (Signed)
[name]: Mackenzie Clark Procedure Date: 02/15/2020 11:42 AM MRN: [id number] Endoscopist: Remo Lipps P. Havery Moros , MD Age: 54 Referring MD:  Date of Birth: Jul 24, 1965 Gender: Female Account #: [id number] Procedure:                Colonoscopy Indications:              Screening for colorectal malignant neoplasm, This                            is the patient's first colonoscopy Medicines:                Monitored Anesthesia Care Procedure:                Pre-Anesthesia Assessment:                           - Prior to the procedure, a History and Physical                            was performed, and patient medications and                            allergies were reviewed. The patient's tolerance of                            previous anesthesia was also reviewed. The risks                            and benefits of the procedure and the sedation                            options and risks were discussed with the patient.                            All questions were answered, and informed consent                            was obtained. Prior Anticoagulants: The patient has                            taken no previous anticoagulant or antiplatelet                            agents. ASA Grade Assessment: II - A patient with                            mild systemic disease. After reviewing the risks                            and benefits, the patient was deemed in                            satisfactory condition to undergo the procedure.  After obtaining informed consent, the colonoscope                            was passed under direct vision. Throughout the                            procedure, the patient's blood pressure, pulse, and                            oxygen saturations were monitored continuously. The                            Colonoscope was introduced through the anus and                            advanced to the the  cecum, identified by                            appendiceal orifice and ileocecal valve. The                            colonoscopy was performed without difficulty. The                            patient tolerated the procedure well. The quality                            of the bowel preparation was good. The ileocecal                            valve, appendiceal orifice, and rectum were                            photographed. Scope In: 11:49:46 AM Scope Out: 12:08:55 PM Scope Withdrawal Time: 0 hours 15 minutes 18 seconds  Total Procedure Duration: 0 hours 19 minutes 9 seconds  Findings:                 The perianal and digital rectal examinations were                            normal.                           A few small-mouthed diverticula were found in the                            sigmoid colon.                           A 3 mm polyp was found in the transverse colon. The                            polyp was sessile. The polyp was removed with a  cold snare. Resection and retrieval were complete.                           Anal papilla(e) were hypertrophied x 2. Biopsies                            were taken with a cold forceps for histology to                            rule out AIN.                           Internal hemorrhoids were found during retroflexion.                           The exam was otherwise without abnormality. Complications:            No immediate complications. Estimated blood loss:                            Minimal. Estimated Blood Loss:     Estimated blood loss was minimal. Impression:               - Diverticulosis in the sigmoid colon.                           - One 3 mm polyp in the transverse colon, removed                            with a cold snare. Resected and retrieved.                           - Anal papilla(e) were hypertrophied. Biopsied to                            rule out AIN.                           -  Internal hemorrhoids.                           - The examination was otherwise normal. Recommendation:           - Patient has a contact number available for                            emergencies. The signs and symptoms of potential                            delayed complications were discussed with the                            patient. Return to normal activities tomorrow.                            Written discharge instructions were provided to the  patient.                           - Resume previous diet.                           - Continue present medications.                           - Await pathology results. Remo Lipps P. Deysi Soldo, MD 02/15/2020 12:13:29 PM This report has been signed electronically.

## 2020-02-15 NOTE — Patient Instructions (Signed)
Please read handouts provided. Continue present medications. Await pathology results.   YOU HAD AN ENDOSCOPIC PROCEDURE TODAY AT THE [facility]:   Refer to the procedure report that was given to you for any specific questions about what was found during the examination.  If the procedure report does not answer your questions, please call your gastroenterologist to clarify.  If you requested that your care partner not be given the details of your procedure findings, then the procedure report has been included in a sealed envelope for you to review at your convenience later.  YOU SHOULD EXPECT: Some feelings of bloating in the abdomen. Passage of more gas than usual.  Walking can help get rid of the air that was put into your GI tract during the procedure and reduce the bloating. If you had a lower endoscopy (such as a colonoscopy or flexible sigmoidoscopy) you may notice spotting of blood in your stool or on the toilet paper. If you underwent a bowel prep for your procedure, you may not have a normal bowel movement for a few days.  Please Note:  You might notice some irritation and congestion in your nose or some drainage.  This is from the oxygen used during your procedure.  There is no need for concern and it should clear up in a day or so.  SYMPTOMS TO REPORT IMMEDIATELY:  Following lower endoscopy (colonoscopy or flexible sigmoidoscopy):  Excessive amounts of blood in the stool  Significant tenderness or worsening of abdominal pains  Swelling of the abdomen that is new, acute  Fever of 100F or higher   For urgent or emergent issues, a gastroenterologist can be reached at any hour by calling [phone]. Do not use MyChart messaging for urgent concerns.    DIET:  We do recommend a small meal at first, but then you may proceed to your regular diet.  Drink plenty of fluids but you should avoid alcoholic beverages for 24 hours.  ACTIVITY:  You should plan to take it easy  for the rest of today and you should NOT DRIVE or use heavy machinery until tomorrow (because of the sedation medicines used during the test).    FOLLOW UP: Our staff will call the number listed on your records 48-72 hours following your procedure to check on you and address any questions or concerns that you may have regarding the information given to you following your procedure. If we do not reach you, we will leave a message.  We will attempt to reach you two times.  During this call, we will ask if you have developed any symptoms of COVID 19. If you develop any symptoms (ie: fever, flu-like symptoms, shortness of breath, cough etc.) before then, please call [phone].  If you test positive for Covid 19 in the 2 weeks post procedure, please call and report this information to us.    If any biopsies were taken you will be contacted by phone or by letter within the next 1-3 weeks.  Please call us at [phone] if you have not heard about the biopsies in 3 weeks.    SIGNATURES/CONFIDENTIALITY: You and/or your care partner have signed paperwork which will be entered into your electronic medical record.  These signatures attest to the fact that that the information above on your After Visit Summary has been reviewed and is understood.  Full responsibility of the confidentiality of this discharge information lies with you and/or your care-partner.  

## 2020-02-15 NOTE — Progress Notes (Signed)
To PACU, VSS. Report to [email or web address] 

## 2020-02-18 MED FILL — CONTRAVE ER 8-90 MG TABLET: 8-90 | 30 days supply | Qty: 120 | Fill #4

## 2020-02-19 ENCOUNTER — Telehealth: Payer: Self-pay

## 2020-02-19 NOTE — Telephone Encounter (Signed)
  Follow up Call-  Call back number 02/15/2020  Post procedure Call Back phone  # [phone]  Permission to leave phone message Yes  Some recent data might be hidden     Patient questions:  Do you have a fever, pain , or abdominal swelling? No. Pain Score  0 *  Have you tolerated food without any problems? Yes.    Have you been able to return to your normal activities? Yes.    Do you have any questions about your discharge instructions: Diet   No. Medications  No. Follow up visit  No.  Do you have questions or concerns about your Care? No.  Actions: * If pain score is 4 or above: No action needed, pain <4.  1. Have you developed a fever since your procedure? no  2.   Have you had an respiratory symptoms (SOB or cough) since your procedure? no  3.   Have you tested positive for COVID 19 since your procedure no  4.   Have you had any family members/close contacts diagnosed with the COVID 19 since your procedure?  no   If yes to any of these questions please route to Joylene John, RN and Joella Prince, RN

## 2020-02-24 DIAGNOSIS — K6282 Dysplasia of anus: Secondary | ICD-10-CM

## 2020-02-24 DIAGNOSIS — D123 Benign neoplasm of transverse colon: Secondary | ICD-10-CM

## 2020-03-25 MED FILL — MOMETASONE FUROATE 50 MCG S: 50 | 30 days supply | Qty: 17 | Fill #2

## 2020-03-25 NOTE — Progress Notes (Signed)
I,Tianna Badgett,acting as a Education administrator for [name], MD.,have documented all relevant documentation on the behalf of [name], MD,as directed by  [name], MD while in the presence of [name], MD.  This visit occurred during the SARS-CoV-2 public health emergency.  Safety protocols were in place, including screening questions prior to the visit, additional usage of staff PPE, and extensive cleaning of exam room while observing appropriate contact time as indicated for disinfecting solutions.  Subjective:     Patient ID: Mackenzie Clark , female    DOB: 18-Jul-1965 , 54 y.o.   MRN: [id number]   Chief Complaint  Patient presents with  . Annual Exam  . Diabetes  . Hypertension    HPI  Patient is here for physical exam. She is followed by Dr. Darron Doom at [facility]. She was last seen Jan 2021. She reports compliance with medication. She has no concerns at this time.   Diabetes She presents for her follow-up diabetic visit. She has type 2 diabetes mellitus. Her disease course has been stable. There are no hypoglycemic associated symptoms. Pertinent negatives for diabetes include no blurred vision and no chest pain. There are no hypoglycemic complications. Risk factors for coronary artery disease include diabetes mellitus, hypertension and obesity. She is compliant with treatment most of the time. She participates in exercise intermittently. An ACE inhibitor/angiotensin II receptor blocker is being taken. Eye exam is current.  Hypertension This is a chronic problem. The current episode started more than 1 year ago. The problem has been gradually improving since onset. The problem is controlled. Pertinent negatives include no blurred vision or chest pain.     Past Medical History:  Diagnosis Date  . Allergy   . Diabetes mellitus without complication (HCC)    diet and exercise controlled  . Hypertension      Family History  Problem Relation Age of Onset  .  Hypertension Mother   . Hyperlipidemia Mother   . Hypertension Father   . Diabetes Father   . Heart disease Father        heart attack age 36  . Hypertension Brother   . Cancer Maternal Grandmother 42       Breast  . Obesity Other   . Colon cancer Neg Hx   . Colon polyps Neg Hx   . Esophageal cancer Neg Hx   . Rectal cancer Neg Hx   . Stomach cancer Neg Hx      Current Outpatient Medications:  .  Dulaglutide (TRULICITY) 3 VE/7.2CN SOPN, Inject 3 mg as directed once a week., Disp: 3 mL, Rfl: 3 .  glucose blood test strip, 1 each by Other route 2 (two) times daily. Use as instructed, Disp: , Rfl:  .  mometasone (NASONEX) 50 MCG/ACT nasal spray, Place 2 sprays into the nose daily., Disp: 17 g, Rfl: 2 .  Naltrexone-buPROPion HCl ER (CONTRAVE) 8-90 MG TB12, 2 tab po bid, Disp: 120 tablet, Rfl: 5 .  olmesartan-hydrochlorothiazide (BENICAR HCT) 40-25 MG tablet, TAKE 1 TABLET BY MOUTH ONCE A DAY, Disp: 90 tablet, Rfl: 1 .  valACYclovir (VALTREX) 1000 MG tablet, Take 1 tablet (1,000 mg total) by mouth as needed., Disp: 90 tablet, Rfl: 1  Current Facility-Administered Medications:  .  dextrose 5 % solution, , Intravenous, Continuous, Armbruster, Carlota Raspberry, MD   No Known Allergies    The patient states she uses none for birth control. Last LMP was No LMP recorded. (Menstrual status: Other).. Negative  for Dysmenorrhea. Negative for: breast discharge, breast lump(s), breast pain and breast self exam. Associated symptoms include abnormal vaginal bleeding. Pertinent negatives include abnormal bleeding (hematology), anxiety, decreased libido, depression, difficulty falling sleep, dyspareunia, history of infertility, nocturia, sexual dysfunction, sleep disturbances, urinary incontinence, urinary urgency, vaginal discharge and vaginal itching. Diet regular.The patient states her exercise level is  intermittent.   . The patient's tobacco use is:  Social History   Tobacco Use  Smoking Status Never  Smoker  Smokeless Tobacco Never Used  . She has been exposed to passive smoke. The patient's alcohol use is:  Social History   Substance and Sexual Activity  Alcohol Use Yes   Comment: occasionally    Review of Systems  Constitutional: Negative.   HENT: Negative.   Eyes: Negative.  Negative for blurred vision.  Respiratory: Negative.   Cardiovascular: Negative.  Negative for chest pain.  Gastrointestinal: Negative.   Endocrine: Negative.   Genitourinary: Negative.   Musculoskeletal: Negative.   Skin: Negative.   Allergic/Immunologic: Negative.   Neurological: Negative.   Hematological: Negative.   Psychiatric/Behavioral: Negative.      Today's Vitals   03/25/20 1712  BP: (!) 146/92  Pulse: 79  Temp: 98.2 F (36.8 C)  TempSrc: Oral  Weight: 189 lb (85.7 kg)  Height: 5' 5"  (1.651 m)   Body mass index is 31.45 kg/m.  Wt Readings from Last 3 Encounters:  03/25/20 189 lb (85.7 kg)  02/15/20 191 lb (86.6 kg)  02/01/20 191 lb (86.6 kg)   BP Readings from Last 3 Encounters:  03/25/20 (!) 146/92  02/15/20 134/88  10/10/19 120/86   Objective:  Physical Exam Vitals and nursing note reviewed.  Constitutional:      Appearance: Normal appearance. She is obese.  HENT:     Head: Normocephalic and atraumatic.     Right Ear: Tympanic membrane, ear canal and external ear normal.     Left Ear: Tympanic membrane, ear canal and external ear normal.     Nose:     Comments: Deferred, masked    Mouth/Throat:     Comments: Deferred, masked Eyes:     Extraocular Movements: Extraocular movements intact.     Conjunctiva/sclera: Conjunctivae normal.     Pupils: Pupils are equal, round, and reactive to light.  Cardiovascular:     Rate and Rhythm: Normal rate and regular rhythm.     Pulses: Normal pulses.          Dorsalis pedis pulses are 2+ on the right side and 2+ on the left side.     Heart sounds: Normal heart sounds.  Pulmonary:     Effort: Pulmonary effort is normal.      Breath sounds: Normal breath sounds.  Chest:  Breasts:     Tanner Score is 5.     Right: Normal.     Left: Normal.    Abdominal:     General: Abdomen is flat. Bowel sounds are normal.     Palpations: Abdomen is soft.  Genitourinary:    Comments: deferred Musculoskeletal:        General: Normal range of motion.     Cervical back: Normal range of motion and neck supple.  Feet:     Right foot:     Protective Sensation: 5 sites tested. 5 sites sensed.     Skin integrity: Dry skin present.     Toenail Condition: Right toenails are normal.     Left foot:     Protective Sensation: 5  sites tested. 5 sites sensed.     Skin integrity: Dry skin present.     Toenail Condition: Left toenails are normal.  Skin:    General: Skin is warm and dry.  Neurological:     General: No focal deficit present.     Mental Status: She is alert and oriented to person, place, and time.  Psychiatric:        Mood and Affect: Mood normal.        Behavior: Behavior normal.         Assessment And Plan:     1. Health maintenance examination Comments: A full exam was performed. Importance of monthly self breast exams was discussed with the patient. PATIENT IS ADVISED TO GET 30-45 MINUTES REGULAR EXERCISE NO LESS THAN FOUR TO FIVE DAYS PER WEEK - BOTH WEIGHTBEARING EXERCISES AND AEROBIC ARE RECOMMENDED.  PATIENT IS ADVISED TO FOLLOW A HEALTHY DIET WITH AT LEAST SIX FRUITS/VEGGIES PER DAY, DECREASE INTAKE OF RED MEAT, AND TO INCREASE FISH INTAKE TO TWO DAYS PER WEEK.  MEATS/FISH SHOULD NOT BE FRIED, BAKED OR BROILED IS PREFERABLE.  I SUGGEST WEARING SPF 50 SUNSCREEN ON EXPOSED PARTS AND ESPECIALLY WHEN IN THE DIRECT SUNLIGHT FOR AN EXTENDED PERIOD OF TIME.  PLEASE AVOID FAST FOOD RESTAURANTS AND INCREASE YOUR WATER INTAKE.  - CBC - Hemoglobin A1c - CMP14+EGFR - Lipid panel - Hepatitis C antibody  2. Dyslipidemia associated with type 2 diabetes mellitus ([place]) Comments: I will check lipid panel today. She  is aware LDL goal is less than 70. I will make recommendations once her labs are available for review. Diabetic foot exam was performed. I DISCUSSED WITH THE PATIENT AT LENGTH REGARDING THE GOALS OF GLYCEMIC CONTROL AND POSSIBLE LONG-TERM COMPLICATIONS.  I  ALSO STRESSED THE IMPORTANCE OF COMPLIANCE WITH HOME GLUCOSE MONITORING, DIETARY RESTRICTIONS INCLUDING AVOIDANCE OF SUGARY DRINKS/PROCESSED FOODS,  ALONG WITH REGULAR EXERCISE.  I  ALSO STRESSED THE IMPORTANCE OF ANNUAL EYE EXAMS, SELF FOOT CARE AND COMPLIANCE WITH OFFICE VISITS.  - POCT Urinalysis Dipstick (81002) - POCT UA - Microalbumin  3. Essential hypertension, benign Comments: Chronic, fair control. She is encouraged to avoid adding salt to her foods. EKG performed, NSR w/o acute changes. She will f/u in four months for re-evaluation. - POCT Urinalysis Dipstick (81002) - POCT UA - Microalbumin - EKG 12-Lead  4. Class 1 obesity due to excess calories with serious comorbidity and body mass index (BMI) of 31.0 to 31.9 in adult Comments: She is encouraged to incorporate at least 150 minutes of exercise per week.   Patient was given opportunity to ask questions. Patient verbalized understanding of the plan and was able to repeat key elements of the plan. All questions were answered to their satisfaction.  [name], MD   I, [name], MD, have reviewed all documentation for this visit. The documentation on 03/26/20 for the exam, diagnosis, procedures, and orders are all accurate and complete.  THE PATIENT IS ENCOURAGED TO PRACTICE SOCIAL DISTANCING DUE TO THE COVID-19 PANDEMIC.

## 2020-03-26 ENCOUNTER — Other Ambulatory Visit: Payer: Self-pay

## 2020-03-26 ENCOUNTER — Encounter: Payer: Self-pay | Admitting: Internal Medicine

## 2020-03-26 ENCOUNTER — Ambulatory Visit (INDEPENDENT_AMBULATORY_CARE_PROVIDER_SITE_OTHER): Payer: No Typology Code available for payment source | Admitting: Internal Medicine

## 2020-03-26 ENCOUNTER — Other Ambulatory Visit: Payer: Self-pay | Admitting: Internal Medicine

## 2020-03-26 VITALS — BP 146/92 | HR 79 | Temp 98.2°F | Ht 65.0 in | Wt 189.0 lb

## 2020-03-26 DIAGNOSIS — Z0000 Encounter for general adult medical examination without abnormal findings: Secondary | ICD-10-CM | POA: Diagnosis not present

## 2020-03-26 DIAGNOSIS — E1169 Type 2 diabetes mellitus with other specified complication: Secondary | ICD-10-CM | POA: Diagnosis not present

## 2020-03-26 DIAGNOSIS — I10 Essential (primary) hypertension: Secondary | ICD-10-CM

## 2020-03-26 DIAGNOSIS — Z6831 Body mass index (BMI) 31.0-31.9, adult: Secondary | ICD-10-CM

## 2020-03-26 DIAGNOSIS — E6609 Other obesity due to excess calories: Secondary | ICD-10-CM

## 2020-03-26 DIAGNOSIS — E785 Hyperlipidemia, unspecified: Secondary | ICD-10-CM

## 2020-03-26 LAB — POCT URINALYSIS DIPSTICK
Bilirubin, UA: NEGATIVE
Blood, UA: NEGATIVE
Glucose, UA: NEGATIVE
Ketones, UA: NEGATIVE
Leukocytes, UA: NEGATIVE
Nitrite, UA: NEGATIVE
Protein, UA: NEGATIVE
Spec Grav, UA: 1.02 (ref 1.010–1.025)
Urobilinogen, UA: 0.2 E.U./dL
pH, UA: 7 (ref 5.0–8.0)

## 2020-03-26 LAB — POCT UA - MICROALBUMIN
Creatinine, POC: 50 mg/dL
Microalbumin Ur, POC: 10 mg/L

## 2020-03-26 MED ORDER — VALACYCLOVIR HCL 1 G PO TABS: 1000.0000 mg | ORAL_TABLET | ORAL | 1 refills | Status: DC | PRN

## 2020-03-26 MED FILL — valACYclovir HCL 1 GM TABS: 1 | 90 days supply | Qty: 90 | Fill #0

## 2020-03-26 NOTE — Patient Instructions (Signed)
Health Maintenance, Female Adopting a healthy lifestyle and getting preventive care are important in promoting health and wellness. Ask your health care provider about:  The right schedule for you to have regular tests and exams.  Things you can do on your own to prevent diseases and keep yourself healthy. What should I know about diet, weight, and exercise? Eat a healthy diet   Eat a diet that includes plenty of vegetables, fruits, low-fat dairy products, and lean protein.  Do not eat a lot of foods that are high in solid fats, added sugars, or sodium. Maintain a healthy weight Body mass index (BMI) is used to identify weight problems. It estimates body fat based on height and weight. Your health care provider can help determine your BMI and help you achieve or maintain a healthy weight. Get regular exercise Get regular exercise. This is one of the most important things you can do for your health. Most adults should:  Exercise for at least 150 minutes each week. The exercise should increase your heart rate and make you sweat (moderate-intensity exercise).  Do strengthening exercises at least twice a week. This is in addition to the moderate-intensity exercise.  Spend less time sitting. Even light physical activity can be beneficial. Watch cholesterol and blood lipids Have your blood tested for lipids and cholesterol at 52 years of age, then have this test every 5 years. Have your cholesterol levels checked more often if:  Your lipid or cholesterol levels are high.  You are older than 52 years of age.  You are at high risk for heart disease. What should I know about cancer screening? Depending on your health history and family history, you may need to have cancer screening at various ages. This may include screening for:  Breast cancer.  Cervical cancer.  Colorectal cancer.  Skin cancer.  Lung cancer. What should I know about heart disease, diabetes, and high blood  pressure? Blood pressure and heart disease  High blood pressure causes heart disease and increases the risk of stroke. This is more likely to develop in people who have high blood pressure readings, are of African descent, or are overweight.  Have your blood pressure checked: ? Every 3-5 years if you are 18-39 years of age. ? Every year if you are 40 years old or older. Diabetes Have regular diabetes screenings. This checks your fasting blood sugar level. Have the screening done:  Once every three years after age 40 if you are at a normal weight and have a low risk for diabetes.  More often and at a younger age if you are overweight or have a high risk for diabetes. What should I know about preventing infection? Hepatitis B If you have a higher risk for hepatitis B, you should be screened for this virus. Talk with your health care provider to find out if you are at risk for hepatitis B infection. Hepatitis C Testing is recommended for:  Everyone born from 1945 through 1965.  Anyone with known risk factors for hepatitis C. Sexually transmitted infections (STIs)  Get screened for STIs, including gonorrhea and chlamydia, if: ? You are sexually active and are younger than 52 years of age. ? You are older than 52 years of age and your health care provider tells you that you are at risk for this type of infection. ? Your sexual activity has changed since you were last screened, and you are at increased risk for chlamydia or gonorrhea. Ask your health care provider if   you are at risk.  Ask your health care provider about whether you are at high risk for HIV. Your health care provider may recommend a prescription medicine to help prevent HIV infection. If you choose to take medicine to prevent HIV, you should first get tested for HIV. You should then be tested every 3 months for as long as you are taking the medicine. Pregnancy  If you are about to stop having your period (premenopausal) and  you may become pregnant, seek counseling before you get pregnant.  Take 400 to 800 micrograms (mcg) of folic acid every day if you become pregnant.  Ask for birth control (contraception) if you want to prevent pregnancy. Osteoporosis and menopause Osteoporosis is a disease in which the bones lose minerals and strength with aging. This can result in bone fractures. If you are 65 years old or older, or if you are at risk for osteoporosis and fractures, ask your health care provider if you should:  Be screened for bone loss.  Take a calcium or vitamin D supplement to lower your risk of fractures.  Be given hormone replacement therapy (HRT) to treat symptoms of menopause. Follow these instructions at home: Lifestyle  Do not use any products that contain nicotine or tobacco, such as cigarettes, e-cigarettes, and chewing tobacco. If you need help quitting, ask your health care provider.  Do not use street drugs.  Do not share needles.  Ask your health care provider for help if you need support or information about quitting drugs. Alcohol use  Do not drink alcohol if: ? Your health care provider tells you not to drink. ? You are pregnant, may be pregnant, or are planning to become pregnant.  If you drink alcohol: ? Limit how much you use to 0-1 drink a day. ? Limit intake if you are breastfeeding.  Be aware of how much alcohol is in your drink. In the [place] one drink equals one 12 oz bottle of beer (355 mL), one 5 oz glass of wine (148 mL), or one 1 oz glass of hard liquor (44 mL). General instructions  Schedule regular health, dental, and eye exams.  Stay current with your vaccines.  Tell your health care provider if: ? You often feel depressed. ? You have ever been abused or do not feel safe at home. Summary  Adopting a healthy lifestyle and getting preventive care are important in promoting health and wellness.  Follow your health care provider's instructions about healthy  diet, exercising, and getting tested or screened for diseases.  Follow your health care provider's instructions on monitoring your cholesterol and blood pressure. This information is not intended to replace advice given to you by your health care provider. Make sure you discuss any questions you have with your health care provider. Document Revised: 03/15/2018 Document Reviewed: 03/15/2018 Elsevier Patient Education  2020 [employer]  

## 2020-03-27 LAB — LIPID PANEL
Chol/HDL Ratio: 2.6 ratio (ref 0.0–4.4)
Cholesterol, Total: 234 mg/dL — ABNORMAL HIGH (ref 100–199)
HDL: 90 mg/dL (ref >39)
LDL Chol Calc (NIH): 135 mg/dL — ABNORMAL HIGH (ref 0–99)
Triglycerides: 51 mg/dL (ref 0–149)
VLDL Cholesterol Cal: 9 mg/dL (ref 5–40)

## 2020-03-27 LAB — CMP14+EGFR
ALT: 16 IU/L (ref 0–32)
AST: 16 IU/L (ref 0–40)
Albumin/Globulin Ratio: 1.7 (ref 1.2–2.2)
Albumin: 4.6 g/dL (ref 3.8–4.9)
Alkaline Phosphatase: 86 IU/L (ref 44–121)
BUN/Creatinine Ratio: 10 (ref 9–23)
BUN: 9 mg/dL (ref 6–24)
Bilirubin Total: 0.4 mg/dL (ref 0.0–1.2)
CO2: 26 mmol/L (ref 20–29)
Calcium: 9.9 mg/dL (ref 8.7–10.2)
Chloride: 97 mmol/L (ref 96–106)
Creatinine, Ser: 0.9 mg/dL (ref 0.57–1.00)
GFR calc Af Amer: 85 mL/min/{1.73_m2} (ref >59)
GFR calc non Af Amer: 74 mL/min/{1.73_m2} (ref >59)
Globulin, Total: 2.7 g/dL (ref 1.5–4.5)
Glucose: 79 mg/dL (ref 65–99)
Potassium: 3.5 mmol/L (ref 3.5–5.2)
Sodium: 140 mmol/L (ref 134–144)
Total Protein: 7.3 g/dL (ref 6.0–8.5)

## 2020-03-27 LAB — HEMOGLOBIN A1C
Est. average glucose Bld gHb Est-mCnc: 111 mg/dL
Hgb A1c MFr Bld: 5.5 % (ref 4.8–5.6)

## 2020-03-27 LAB — CBC
Hematocrit: 40.2 % (ref 34.0–46.6)
Hemoglobin: 13.5 g/dL (ref 11.1–15.9)
MCH: 29.2 pg (ref 26.6–33.0)
MCHC: 33.6 g/dL (ref 31.5–35.7)
MCV: 87 fL (ref 79–97)
Platelets: 262 10*3/uL (ref 150–450)
RBC: 4.62 x10E6/uL (ref 3.77–5.28)
RDW: 12.9 % (ref 11.7–15.4)
WBC: 3.7 10*3/uL (ref 3.4–10.8)

## 2020-03-27 LAB — HEPATITIS C ANTIBODY: Hep C Virus Ab: <0.1 s/co ratio (ref 0.0–0.9)

## 2020-03-31 ENCOUNTER — Encounter: Payer: Self-pay | Admitting: Internal Medicine

## 2020-03-31 ENCOUNTER — Other Ambulatory Visit: Payer: Self-pay | Admitting: Internal Medicine

## 2020-03-31 MED ORDER — CLOTRIMAZOLE-BETAMETHASONE 1-0.05 % EX CREA: TOPICAL_CREAM | CUTANEOUS | 1 refills | Status: DC

## 2020-03-31 MED FILL — CLOTRIMAZOLE-BETAMETHASONE: 1-0.05 | 10 days supply | Qty: 15 | Fill #0

## 2020-04-10 MED FILL — CONTRAVE ER 8-90 MG TABLET: 8-90 | 30 days supply | Qty: 120 | Fill #5

## 2020-05-09 ENCOUNTER — Other Ambulatory Visit: Payer: Self-pay | Admitting: Internal Medicine

## 2020-05-09 MED FILL — TRULICITY 3 MG/0.5ML SOPN: 3 | 84 days supply | Qty: 6 | Fill #1

## 2020-05-09 MED FILL — OLMESARTAN-HCTZ 40-25 MG TA: 40-25 | 90 days supply | Qty: 90 | Fill #0

## 2020-05-14 ENCOUNTER — Other Ambulatory Visit: Payer: Self-pay | Admitting: Internal Medicine

## 2020-05-14 MED FILL — CONTRAVE ER 8-90 MG TABLET: 8-90 | 30 days supply | Qty: 120 | Fill #0

## 2020-05-14 NOTE — Telephone Encounter (Signed)
Contrave

## 2020-06-10 IMAGING — XA IR ANGIO/ADD [PERSON_NAME]
12 of 21 series · 12 of 24 positions shown · IV contrast (IODINE)
Comparison: none

INDICATION: 53-year-old female with a history of symptomatic uterine fibroids,
presents for treatment with uterine artery embolization

[Series 3: care body 4 · 1 of 24 frames shown (1 of 11)]
[frame 13/24]
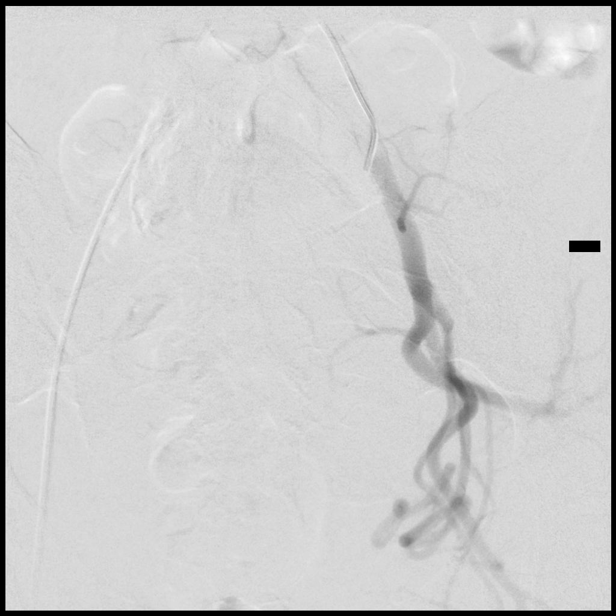

[Series 5: care body 4 · 1 of 28 frames shown (2 of 11)]
[frame 1/28]
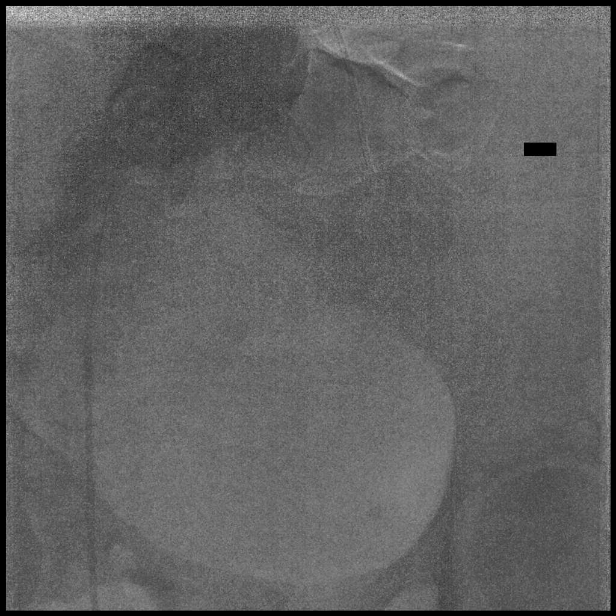

[Series 7: care body 4 · 1 of 37 frames shown (3 of 11)]
[frame 6/37]
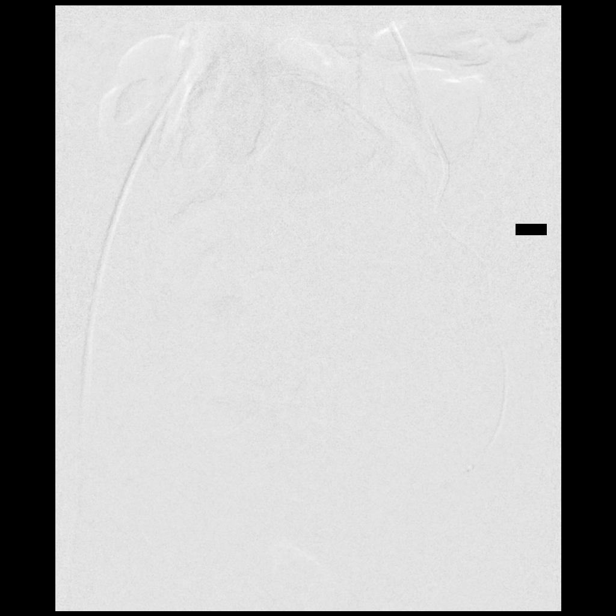

[Series 8: care body 4 · 1 of 63 frames shown (4 of 11)]
[frame 54/63]
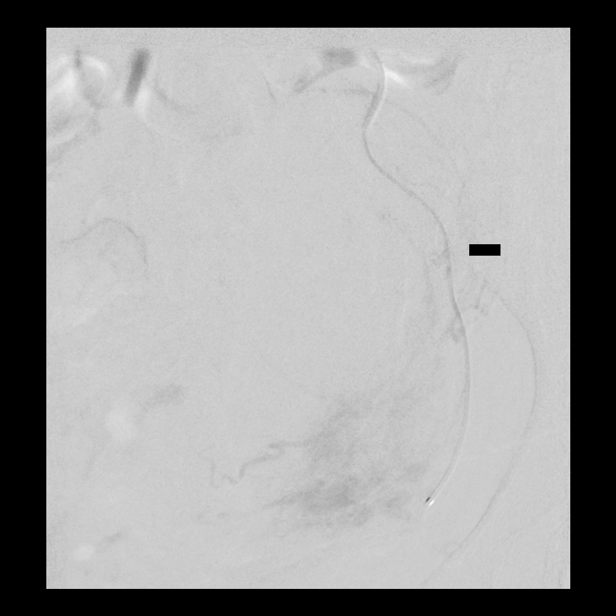

[Series 10: care body 4 · 1 of 58 frames shown (5 of 11)]
[frame 30/58]
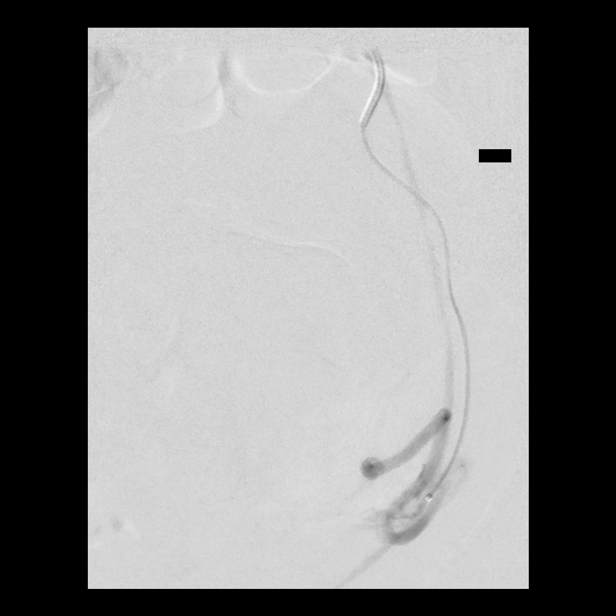

[Series 13: care body 4 · 1 of 26 frames shown (6 of 11)]
[frame 4/26]
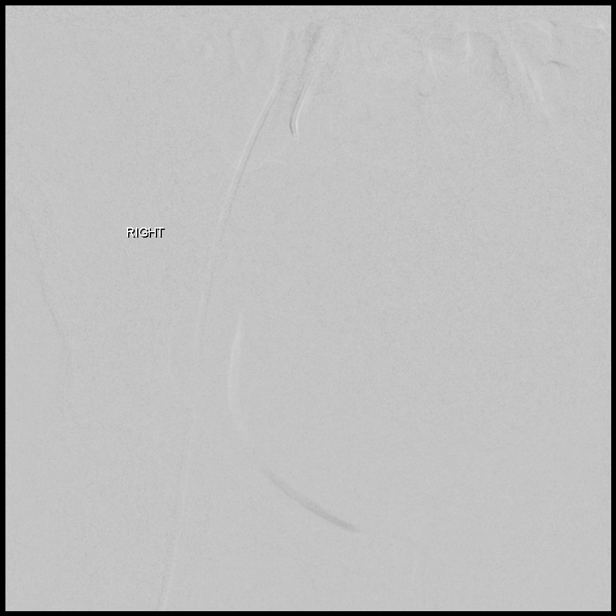

[Series 15: care body 4 · 1 of 20 frames shown (7 of 11)]
[frame 1/20]
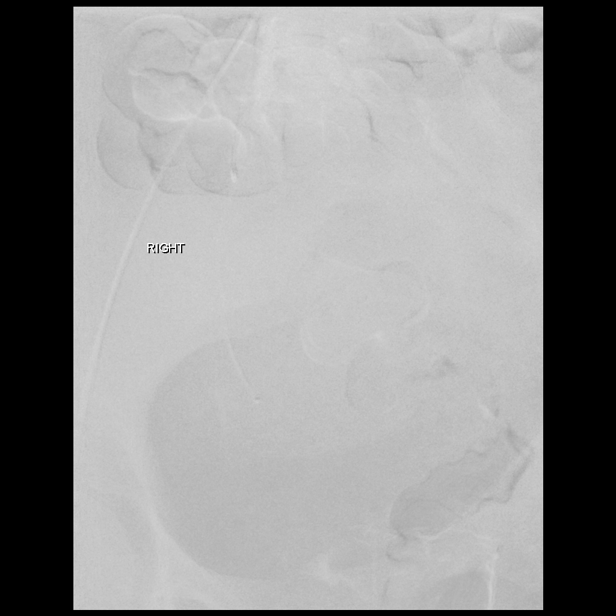

[Series 16: care body 4 · 1 of 38 frames shown (8 of 11)]
[frame 33/38]
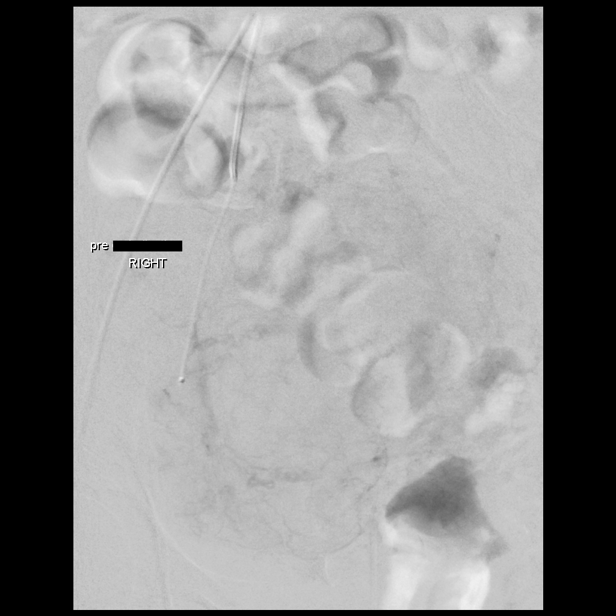

[Series 18: care body 4 · 1 of 19 frames shown (9 of 11)]
[frame 10/19]
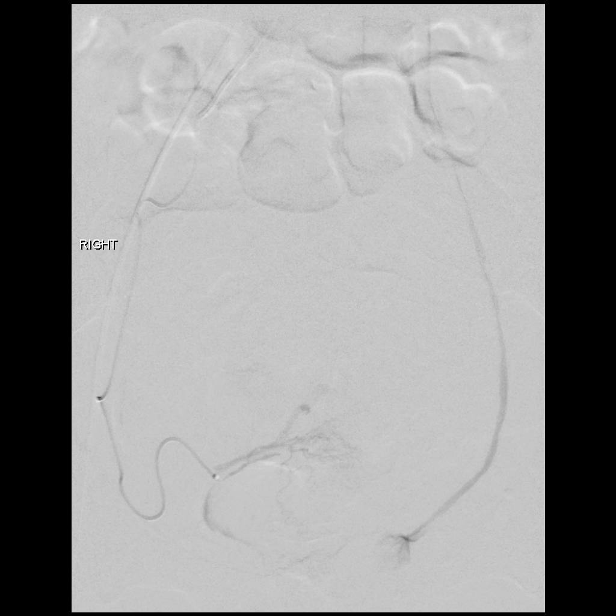

[Series 20: care body 4 · 1 of 59 frames shown (10 of 11)]
[frame 9/59]
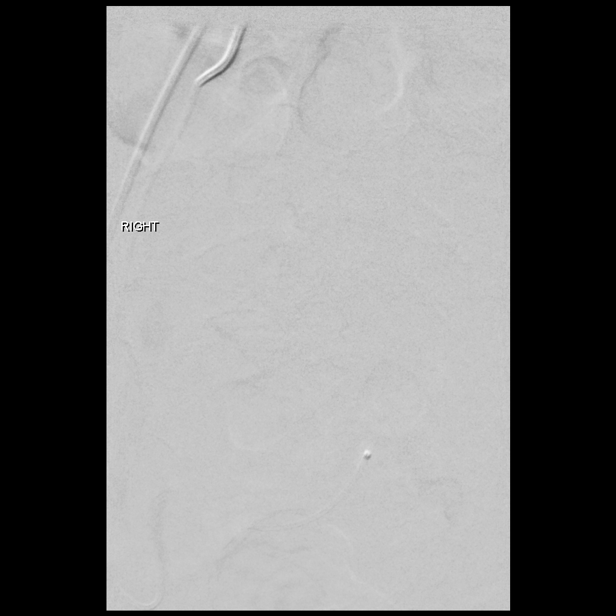

[Series 21: care body 4 · 1 of 70 frames shown (11 of 11)]
[frame 60/70]
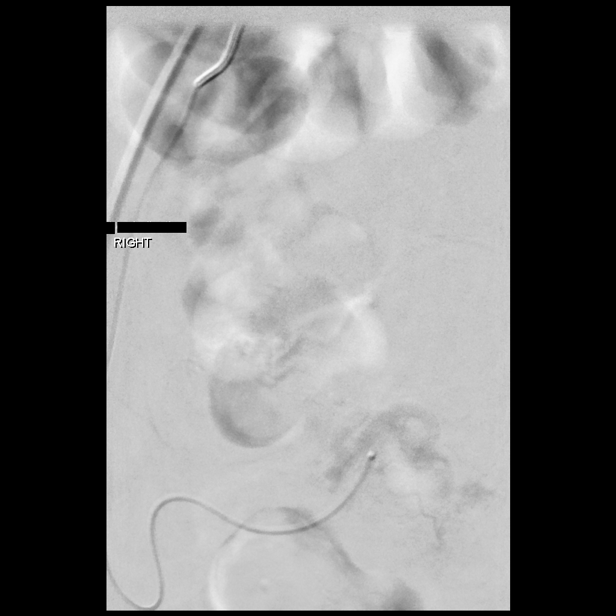

[Series 300: ld dsa body · 1 of 5 slices shown]
[im 5/5]
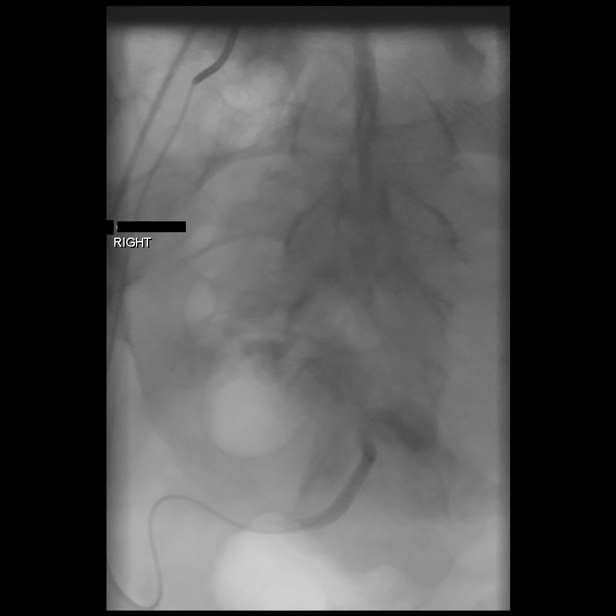

[12 of 24 positions shown; findings below may reference images not displayed]

EXAM:
ULTRASOUND GUIDED ACCESS RIGHT COMMON FEMORAL ARTERY

IMAGE GUIDED SUPERIOR HYPOGASTRIC NERVE BLOCK

PELVIC ANGIOGRAM AND THERAPEUTIC EMBOLIZATION OF BILATERAL UTERINE
ARTERIES

EXOSEAL FOR HEMOSTASIS

MEDICATIONS:
4 g Zofran, 2 g Ancef. The antibiotic was administered within 1 hour
of the procedure

ANESTHESIA/SEDATION:
Moderate (conscious) sedation was employed during this procedure. A
total of Versed 4.0 mg and Fentanyl 200 mcg was administered
intravenously.

Moderate Sedation Time: 71 minutes. The patient's level of
consciousness and vital signs were monitored continuously by
radiology nursing throughout the procedure under my direct
supervision.

CONTRAST:  120 cc Omni

FLUOROSCOPY TIME:  Fluoroscopy Time: 19 minutes 0 seconds (1772
mGy).

COMPLICATIONS:
None

PROCEDURE:
The procedure, risks, benefits, and alternatives were explained to
the patient. Questions regarding the procedure were encouraged and
answered. The patient understands and consents to the procedure.

The right groin was prepped with Betadine in a sterile fashion, and
a sterile drape was applied covering the operative field. A sterile
gown and sterile gloves were used for the procedure. Local
anesthesia was provided with 1% Lidocaine.

Ultrasound survey of the right inguinal region was performed with
images stored and sent to PACs.

The skin and subcutaneous tissue generously infiltrated with 1%
lidocaine for local anesthesia, and a micropuncture needle was
advanced under ultrasound guidance into the right common femoral
artery. With excellent blood flow returned, micro wire was advanced.
The needle was removed and a micropuncture set was advanced over the
micro wire. The inner dilator in the stylet were removed, and an 035
Bentson wire was advanced into the abdominal aorta. The micro sheath
was removed, a 5 French vascular sheath was placed into the common
femoral artery. The dilator was removed and the sheath was flushed.

A 5 French Cobra catheter was advanced over the Bentson wire and
used to select the left common iliac artery. The Cobra catheter was
advanced into the internal iliac artery.

Once the catheter was over the bifurcation of the aorta, a superior
hypogastric nerve block was performed via an anterior approach by
directing a 22 gauge, 15 cm Chiba needle onto the anterior surface
of L5 below the bifurcation of the aorta. Once the surface the
needle was in contact with the vertebral body, the stylet was
removed, small amount of contrast confirmed a prevertebral needle
location, and a solution of contrast and ~20cc 0.5% ropivacaine
was infused with fluoroscopy. Approximately 20 cc of ropivacaine was
administered. The needle was removed.

Angiogram of the left hypogastric artery was performed for
identification of the left uterine artery. Once we identified the
origin, a high-flow Renegade micro catheter was advanced to the
Cobra catheter with a 0.014 Fathom wire. The micro catheter and
micro wire combination were used to select the uterine artery.

Angiogram was performed.

Once we confirmed position the micro catheter within the distal
descending left uterine artery, embolization was performed.
Embolization of the left uterine artery was performed with 3 vial of
embospheres (500 - 700 microm). Embolization was performed to 5 beat
stasis.

The the micro wire/micro catheter were removed, and the Cobra
catheter and Bentson wire were used to form Carvensky Tilus loop. The
Niang loop was then used to select the ipsilateral hypogastric
artery.

Angiogram was performed to confirm position and identified the
origin of the urine artery. Once we confirmed the origin, the same
micro catheter and micro wire combination were used to select the
uterine artery. The catheter was advanced to the distal descending
right uterine artery. Angiogram was performed to confirm position.

Embolization of the right uterine artery was performed with 1-1/3
vials of 500 -700 microm embospheres. Five beat stasis was achieved.

The Niang loop was reduced via the left common iliac artery with
the assistance of the Bentson wire, and then the catheter and wire
were removed.

Angiogram of the right common femoral artery was performed.

An Exoseal device was deployed for hemostasis.

The patient tolerated the procedure well and remained
hemodynamically stable throughout.

No complications were encountered and no significant blood loss was
encountered.
IMPRESSION: Status post ultrasound guided access right common femoral artery for
pelvic angiogram, superior hypogastric nerve block, and bilateral
uterine artery embolization for symptomatic fibroids.

ExoSeal deployed for hemostasis.

PLAN:
The patient will be admitted for 1 night for observation.

Advanced diet as tolerated.

The patient should have her right leg straight for 3 hr given the
right common femoral artery access.

The patient will be treated with anti and nausea medicine scheduled
and as needed for breakthrough nausea.

Toradol IV 30 mg Q 6 hr.

A PCA pump has been administered for breakthrough pain. DC at 6 a.m.
04/04/2014.

Hydration, with 150 cc/hour of normal saline for 8 hr.

Removal of Foley catheter in the evening, or at 6 a.m. 04/04/2014.

The patient will have a follow-up appointment in 2 - 4 weeks.

## 2020-07-02 ENCOUNTER — Other Ambulatory Visit: Payer: Self-pay | Admitting: Internal Medicine

## 2020-07-02 MED FILL — MOMETASONE FUROATE 50 MCG S: 50 | 30 days supply | Qty: 17 | Fill #0

## 2020-07-17 ENCOUNTER — Ambulatory Visit: Payer: No Typology Code available for payment source | Admitting: Family Medicine

## 2020-07-31 ENCOUNTER — Ambulatory Visit (INDEPENDENT_AMBULATORY_CARE_PROVIDER_SITE_OTHER): Payer: No Typology Code available for payment source | Admitting: Internal Medicine

## 2020-07-31 ENCOUNTER — Other Ambulatory Visit: Payer: Self-pay

## 2020-07-31 ENCOUNTER — Encounter: Payer: Self-pay | Admitting: Internal Medicine

## 2020-07-31 VITALS — BP 134/90 | HR 81 | Temp 97.7°F | Ht 65.0 in | Wt 186.6 lb

## 2020-07-31 DIAGNOSIS — J301 Allergic rhinitis due to pollen: Secondary | ICD-10-CM

## 2020-07-31 DIAGNOSIS — E1169 Type 2 diabetes mellitus with other specified complication: Secondary | ICD-10-CM | POA: Diagnosis not present

## 2020-07-31 DIAGNOSIS — E785 Hyperlipidemia, unspecified: Secondary | ICD-10-CM

## 2020-07-31 DIAGNOSIS — I10 Essential (primary) hypertension: Secondary | ICD-10-CM | POA: Diagnosis not present

## 2020-07-31 DIAGNOSIS — Z6831 Body mass index (BMI) 31.0-31.9, adult: Secondary | ICD-10-CM

## 2020-07-31 DIAGNOSIS — E6609 Other obesity due to excess calories: Secondary | ICD-10-CM

## 2020-07-31 NOTE — Patient Instructions (Signed)

## 2020-07-31 NOTE — Progress Notes (Signed)
I,Mackenzie Clark,acting as a Education administrator for Mackenzie Greenland, MD.,have documented all relevant documentation on the behalf of Mackenzie Greenland, MD,as directed by  Mackenzie Greenland, MD while in the presence of Mackenzie Greenland, MD.  This visit occurred during the SARS-CoV-2 public health emergency.  Safety protocols were in place, including screening questions prior to the visit, additional usage of staff PPE, and extensive cleaning of exam room while observing appropriate contact time as indicated for disinfecting solutions.  Subjective:     Patient ID: Mackenzie Clark , female    DOB: 1965/10/29 , 55 y.o.   MRN: [id number]   Chief Complaint  Patient presents with  . Hypertension    HPI  She presents today for BP f/u. She reports compliance with meds. She denies headaches, chest pain and shortness of breath.   Hypertension This is a chronic problem. The current episode started more than 1 year ago. The problem has been gradually improving since onset. The problem is controlled. Pertinent negatives include no blurred vision or chest pain.  Diabetes She presents for her follow-up diabetic visit. She has type 2 diabetes mellitus. Her disease course has been stable. There are no hypoglycemic associated symptoms. Pertinent negatives for diabetes include no blurred vision and no chest pain. There are no hypoglycemic complications. Risk factors for coronary artery disease include diabetes mellitus, hypertension and obesity. She is compliant with treatment most of the time. She participates in exercise intermittently. An ACE inhibitor/angiotensin II receptor blocker is being taken. Eye exam is current.     Past Medical History:  Diagnosis Date  . Allergy   . Diabetes mellitus without complication (HCC)    diet and exercise controlled  . Hypertension      Family History  Problem Relation Age of Onset  . Hypertension Mother   . Hyperlipidemia Mother   . Hypertension Father   . Diabetes Father    . Heart disease Father        heart attack age 109  . Hypertension Brother   . Cancer Maternal Grandmother 37       Breast  . Obesity Other   . Colon cancer Neg Hx   . Colon polyps Neg Hx   . Esophageal cancer Neg Hx   . Rectal cancer Neg Hx   . Stomach cancer Neg Hx      Current Outpatient Medications:  .  clotrimazole-betamethasone (LOTRISONE) cream, APPLY TO AFFECTED AREA 2 TIMES DAILY AS NEEDED, Disp: 15 g, Rfl: 1 .  Dulaglutide 3 MG/0.5ML SOPN, INJECT 3 MG AS DIRECTED ONCE A WEEK., Disp: 6 mL, Rfl: 3 .  Naltrexone-buPROPion HCl ER 8-90 MG TB12, TAKE 2 TABLETS BY MOUTH TWICE DAILY, Disp: 120 tablet, Rfl: 2 .  olmesartan-hydrochlorothiazide (BENICAR HCT) 40-25 MG tablet, TAKE 1 TABLET BY MOUTH ONCE DAILY, Disp: 90 tablet, Rfl: 1 .  valACYclovir (VALTREX) 1000 MG tablet, TAKE 1 TABLET BY MOUTH DAILY AS NEEDED AS DIRECTED, Disp: 90 tablet, Rfl: 1 .  glucose blood test strip, 1 each by Other route 2 (two) times daily. Use as instructed, Disp: , Rfl:   Current Facility-Administered Medications:  .  dextrose 5 % solution, , Intravenous, Continuous, Armbruster, Carlota Raspberry, MD   No Known Allergies   Review of Systems  Constitutional: Negative.   Eyes: Negative for blurred vision.  Respiratory: Negative.   Cardiovascular: Negative.  Negative for chest pain.  Gastrointestinal: Negative.   Psychiatric/Behavioral: Negative.   All other systems reviewed and are negative.  Today's Vitals   07/31/20 0835  BP: 134/90  Pulse: 81  Temp: 97.7 F (36.5 C)  TempSrc: Oral  Weight: 186 lb 9.6 oz (84.6 kg)  Height: 5' 5"  (1.651 m)   Body mass index is 31.05 kg/m.  Wt Readings from Last 3 Encounters:  07/31/20 186 lb 9.6 oz (84.6 kg)  03/25/20 189 lb (85.7 kg)  02/15/20 191 lb (86.6 kg)   Objective:  Physical Exam Vitals and nursing note reviewed.  Constitutional:      Appearance: Normal appearance.  HENT:     Head: Normocephalic and atraumatic.     Nose:     Comments:  Masked     Mouth/Throat:     Comments: Masked  Cardiovascular:     Rate and Rhythm: Normal rate and regular rhythm.     Heart sounds: Normal heart sounds.  Pulmonary:     Effort: Pulmonary effort is normal.     Breath sounds: Normal breath sounds.  Musculoskeletal:     Cervical back: Normal range of motion.  Skin:    General: Skin is warm.  Neurological:     General: No focal deficit present.     Mental Status: She is alert.  Psychiatric:        Mood and Affect: Mood normal.        Behavior: Behavior normal.         Assessment And Plan:     1. Essential hypertension, benign Comments: Chronic, fair control. I would like to add amlodipine 2.35m nightly; however, she does not wish to do so at this time. Encouraged to exercise five days weekly.  2. Dyslipidemia associated with type 2 diabetes mellitus ([place] Comments: I will check labs as listed below. She is encouraged to limit her intake of sweetened beverages, including diet drinks. - BMP8+EGFR - Hemoglobin A1c - Insulin, random(561)  3. Seasonal allergic rhinitis due to pollen Comments: Unfortunately, her insurance will no longer cover Nasonex after July 1st. She would like to switch to Astelin at that time. Flonase has been ineffective in the  4. Class 1 obesity due to excess calories with serious comorbidity and body mass index (BMI) of 31.0 to 31.9 in adult  She is encouraged to strive for BMI less than 30 to decrease cardiac risk. Advised to aim for at least 150 minutes of exercise per week.   Patient was given opportunity to ask questions. Patient verbalized understanding of the plan and was able to repeat key elements of the plan. All questions were answered to their satisfaction.   I, RMaximino Greenland MD, have reviewed all documentation for this visit. The documentation on 07/31/20 for the exam, diagnosis, procedures, and orders are all accurate and complete.   IF YOU HAVE BEEN REFERRED TO A SPECIALIST, IT MAY TAKE  1-2 WEEKS TO SCHEDULE/PROCESS THE REFERRAL. IF YOU HAVE NOT HEARD FROM US/SPECIALIST IN TWO WEEKS, PLEASE GIVE [place] CALL AT [phone] X [phone].   THE PATIENT IS ENCOURAGED TO PRACTICE SOCIAL DISTANCING DUE TO THE COVID-19 PANDEMIC.

## 2020-08-01 ENCOUNTER — Encounter: Payer: Self-pay | Admitting: Internal Medicine

## 2020-08-01 ENCOUNTER — Other Ambulatory Visit (HOSPITAL_COMMUNITY): Payer: Self-pay

## 2020-08-01 LAB — HEMOGLOBIN A1C
Est. average glucose Bld gHb Est-mCnc: 108 mg/dL
Hgb A1c MFr Bld: 5.4 % (ref 4.8–5.6)

## 2020-08-01 LAB — INSULIN, RANDOM: INSULIN: 6.2 u[IU]/mL (ref 2.6–24.9)

## 2020-08-01 LAB — BMP8+EGFR
BUN/Creatinine Ratio: 10 (ref 9–23)
BUN: 11 mg/dL (ref 6–24)
CO2: 29 mmol/L (ref 20–29)
Calcium: 10 mg/dL (ref 8.7–10.2)
Chloride: 99 mmol/L (ref 96–106)
Creatinine, Ser: 1.05 mg/dL — ABNORMAL HIGH (ref 0.57–1.00)
Glucose: 89 mg/dL (ref 65–99)
Potassium: 3.7 mmol/L (ref 3.5–5.2)
Sodium: 142 mmol/L (ref 134–144)
eGFR: 64 mL/min/{1.73_m2} (ref >59)

## 2020-08-01 MED FILL — Naltrexone HCl-Bupropion HCl Tab ER 12HR 8-90 MG: ORAL | 30 days supply | Qty: 120 | Fill #0 | Status: AC

## 2020-08-01 MED FILL — Olmesartan Medoxomil-Hydrochlorothiazide Tab 40-25 MG: ORAL | 90 days supply | Qty: 90 | Fill #0 | Status: AC

## 2020-08-04 ENCOUNTER — Other Ambulatory Visit (HOSPITAL_COMMUNITY): Payer: Self-pay

## 2020-08-05 ENCOUNTER — Other Ambulatory Visit (HOSPITAL_COMMUNITY): Payer: Self-pay

## 2020-08-11 ENCOUNTER — Other Ambulatory Visit: Payer: Self-pay

## 2020-08-11 ENCOUNTER — Other Ambulatory Visit (HOSPITAL_COMMUNITY)
Admission: RE | Admit: 2020-08-11 | Discharge: 2020-08-11 | Disposition: A | Discharge status: Home / Self Care | Payer: No Typology Code available for payment source | Source: Ambulatory Visit | Attending: Family Medicine | Admitting: Family Medicine

## 2020-08-11 ENCOUNTER — Ambulatory Visit (INDEPENDENT_AMBULATORY_CARE_PROVIDER_SITE_OTHER): Payer: No Typology Code available for payment source | Admitting: Family Medicine

## 2020-08-11 VITALS — BP 137/84 | HR 85 | Ht 67.0 in | Wt 188.0 lb

## 2020-08-11 DIAGNOSIS — Z01419 Encounter for gynecological examination (general) (routine) without abnormal findings: Secondary | ICD-10-CM | POA: Diagnosis not present

## 2020-08-11 DIAGNOSIS — R87810 Cervical high risk human papillomavirus (HPV) DNA test positive: Secondary | ICD-10-CM | POA: Insufficient documentation

## 2020-08-11 NOTE — Patient Instructions (Signed)
Preventive Care 84-53 Years Old, Female Preventive care refers to lifestyle choices and visits with your health care provider that can promote health and wellness. This includes:  A yearly physical exam. This is also called an annual wellness visit.  Regular dental and eye exams.  Immunizations.  Screening for certain conditions.  Healthy lifestyle choices, such as: ? Eating a healthy diet. ? Getting regular exercise. ? Not using drugs or products that contain nicotine and tobacco. ? Limiting alcohol use. What can I expect for my preventive care visit? Physical exam Your health care provider will check your:  Height and weight. These may be used to calculate your BMI (body mass index). BMI is a measurement that tells if you are at a healthy weight.  Heart rate and blood pressure.  Body temperature.  Skin for abnormal spots. Counseling Your health care provider may ask you questions about your:  Past medical problems.  Family's medical history.  Alcohol, tobacco, and drug use.  Emotional well-being.  Home life and relationship well-being.  Sexual activity.  Diet, exercise, and sleep habits.  Work and work Statistician.  Access to firearms.  Method of birth control.  Menstrual cycle.  Pregnancy history. What immunizations do I need? Vaccines are usually given at various ages, according to a schedule. Your health care provider will recommend vaccines for you based on your age, medical history, and lifestyle or other factors, such as travel or where you work.   What tests do I need? Blood tests  Lipid and cholesterol levels. These may be checked every 5 years, or more often if you are over 3 years old.  Hepatitis C test.  Hepatitis B test. Screening  Lung cancer screening. You may have this screening every year starting at age 73 if you have a 30-pack-year history of smoking and currently smoke or have quit within the past 15 years.  Colorectal cancer  screening. ? All adults should have this screening starting at age 52 and continuing until age 17. ? Your health care provider may recommend screening at age 49 if you are at increased risk. ? You will have tests every 1-10 years, depending on your results and the type of screening test.  Diabetes screening. ? This is done by checking your blood sugar (glucose) after you have not eaten for a while (fasting). ? You may have this done every 1-3 years.  Mammogram. ? This may be done every 1-2 years. ? Talk with your health care provider about when you should start having regular mammograms. This may depend on whether you have a family history of breast cancer.  BRCA-related cancer screening. This may be done if you have a family history of breast, ovarian, tubal, or peritoneal cancers.  Pelvic exam and Pap test. ? This may be done every 3 years starting at age 10. ? Starting at age 11, this may be done every 5 years if you have a Pap test in combination with an HPV test. Other tests  STD (sexually transmitted disease) testing, if you are at risk.  Bone density scan. This is done to screen for osteoporosis. You may have this scan if you are at high risk for osteoporosis. Talk with your health care provider about your test results, treatment options, and if necessary, the need for more tests. Follow these instructions at home: Eating and drinking  Eat a diet that includes fresh fruits and vegetables, whole grains, lean protein, and low-fat dairy products.  Take vitamin and mineral supplements  as recommended by your health care provider.  Do not drink alcohol if: ? Your health care provider tells you not to drink. ? You are pregnant, may be pregnant, or are planning to become pregnant.  If you drink alcohol: ? Limit how much you have to 0-1 drink a day. ? Be aware of how much alcohol is in your drink. In the [place] one drink equals one 12 oz bottle of beer (355 mL), one 5 oz glass of  wine (148 mL), or one 1 oz glass of hard liquor (44 mL).   Lifestyle  Take daily care of your teeth and gums. Brush your teeth every morning and night with fluoride toothpaste. Floss one time each day.  Stay active. Exercise for at least 30 minutes 5 or more days each week.  Do not use any products that contain nicotine or tobacco, such as cigarettes, e-cigarettes, and chewing tobacco. If you need help quitting, ask your health care provider.  Do not use drugs.  If you are sexually active, practice safe sex. Use a condom or other form of protection to prevent STIs (sexually transmitted infections).  If you do not wish to become pregnant, use a form of birth control. If you plan to become pregnant, see your health care provider for a prepregnancy visit.  If told by your health care provider, take low-dose aspirin daily starting at age 50.  Find healthy ways to cope with stress, such as: ? Meditation, yoga, or listening to music. ? Journaling. ? Talking to a trusted person. ? Spending time with friends and family. Safety  Always wear your seat belt while driving or riding in a vehicle.  Do not drive: ? If you have been drinking alcohol. Do not ride with someone who has been drinking. ? When you are tired or distracted. ? While texting.  Wear a helmet and other protective equipment during sports activities.  If you have firearms in your house, make sure you follow all gun safety procedures. What's next?  Visit your health care provider once a year for an annual wellness visit.  Ask your health care provider how often you should have your eyes and teeth checked.  Stay up to date on all vaccines. This information is not intended to replace advice given to you by your health care provider. Make sure you discuss any questions you have with your health care provider. Document Revised: 12/25/2019 Document Reviewed: 12/01/2017 Elsevier Patient Education  2021 Elsevier Inc.  

## 2020-08-13 ENCOUNTER — Encounter: Payer: Self-pay | Admitting: Family Medicine

## 2020-08-13 LAB — CYTOLOGY - PAP
Adequacy: ABSENT
Comment: NEGATIVE
Diagnosis: NEGATIVE
Diagnosis: REACTIVE
High risk HPV: NEGATIVE

## 2020-08-13 NOTE — Progress Notes (Signed)
  Subjective:     Mackenzie Clark is a 55 y.o. female and is here for a comprehensive physical exam. The patient reports no problems. She has had her UFE and had good results.  The following portions of the patient's history were reviewed and updated as appropriate: allergies, current medications, past family history, past medical history, past social history, past surgical history and problem list.  Review of Systems Pertinent items noted in HPI and remainder of comprehensive ROS otherwise negative.   Objective:    BP 137/84   Pulse 85   Ht 5\' 7"  (1.702 m)   Wt 188 lb (85.3 kg)   LMP  (LMP Unknown) Comment: 2021  BMI 29.44 kg/m  General appearance: alert, cooperative and appears stated age Head: Normocephalic, without obvious abnormality, atraumatic Neck: no adenopathy, supple, symmetrical, trachea midline and thyroid not enlarged, symmetric, no tenderness/mass/nodules Lungs: clear to auscultation bilaterally Breasts: normal appearance, no masses or tenderness Heart: regular rate and rhythm Abdomen: soft, non-tender; bowel sounds normal; no masses,  no organomegaly Pelvic: cervix normal in appearance, external genitalia normal, no adnexal masses or tenderness, no cervical motion tenderness, uterus normal size, shape, and consistency and vagina normal without discharge Extremities: extremities normal, atraumatic, no cyanosis or edema Pulses: 2+ and symmetric Skin: Skin color, texture, turgor normal. No rashes or lesions Lymph nodes: Cervical, supraclavicular, and axillary nodes normal. Neurologic: Grossly normal    Assessment:    Healthy female exam.      Plan:  Cervical high risk HPV (human papillomavirus) test positive - Plan: Cytology - PAP( [facility])  Encounter for gynecological examination without abnormal finding  Mammogram up to date 10/21 at [facility]   See After Visit Summary for Counseling Recommendations

## 2020-09-18 ENCOUNTER — Other Ambulatory Visit (HOSPITAL_COMMUNITY): Payer: Self-pay

## 2020-09-18 ENCOUNTER — Other Ambulatory Visit: Payer: Self-pay | Admitting: Internal Medicine

## 2020-09-18 MED FILL — Dulaglutide Soln Auto-injector 3 MG/0.5ML: SUBCUTANEOUS | 84 days supply | Qty: 6 | Fill #0 | Status: AC

## 2020-09-19 ENCOUNTER — Other Ambulatory Visit (HOSPITAL_COMMUNITY): Payer: Self-pay

## 2020-09-19 MED ORDER — CONTRAVE 8-90 MG PO TB12
2.0000 | ORAL_TABLET | Freq: Two times a day (BID) | ORAL | 2 refills | Status: DC
  Filled 2020-09-19: qty 120, 30d supply, fill #0
  Filled 2020-11-07: qty 120, 30d supply, fill #1

## 2020-09-24 ENCOUNTER — Other Ambulatory Visit (HOSPITAL_COMMUNITY): Payer: Self-pay

## 2020-10-13 ENCOUNTER — Other Ambulatory Visit (HOSPITAL_COMMUNITY): Payer: Self-pay

## 2020-11-07 ENCOUNTER — Other Ambulatory Visit (HOSPITAL_COMMUNITY): Payer: Self-pay

## 2020-11-07 ENCOUNTER — Other Ambulatory Visit: Payer: Self-pay | Admitting: Internal Medicine

## 2020-11-07 DIAGNOSIS — I10 Essential (primary) hypertension: Secondary | ICD-10-CM

## 2020-11-10 ENCOUNTER — Other Ambulatory Visit (HOSPITAL_COMMUNITY): Payer: Self-pay

## 2020-11-10 MED ORDER — OLMESARTAN MEDOXOMIL-HCTZ 40-25 MG PO TABS
1.0000 | ORAL_TABLET | Freq: Every day | ORAL | 1 refills | Status: DC
  Filled 2020-11-10: qty 90, 90d supply, fill #0
  Filled 2021-02-13: qty 90, 90d supply, fill #1

## 2020-11-11 ENCOUNTER — Encounter: Payer: Self-pay | Admitting: Internal Medicine

## 2020-11-11 ENCOUNTER — Other Ambulatory Visit: Payer: Self-pay

## 2020-11-11 ENCOUNTER — Ambulatory Visit (INDEPENDENT_AMBULATORY_CARE_PROVIDER_SITE_OTHER): Payer: No Typology Code available for payment source | Admitting: Internal Medicine

## 2020-11-11 ENCOUNTER — Other Ambulatory Visit (HOSPITAL_COMMUNITY): Payer: Self-pay

## 2020-11-11 VITALS — BP 128/72 | HR 76 | Temp 97.8°F | Ht 67.0 in | Wt 187.0 lb

## 2020-11-11 DIAGNOSIS — I10 Essential (primary) hypertension: Secondary | ICD-10-CM

## 2020-11-11 DIAGNOSIS — E663 Overweight: Secondary | ICD-10-CM | POA: Diagnosis not present

## 2020-11-11 DIAGNOSIS — Z6829 Body mass index (BMI) 29.0-29.9, adult: Secondary | ICD-10-CM

## 2020-11-11 DIAGNOSIS — Z23 Encounter for immunization: Secondary | ICD-10-CM | POA: Diagnosis not present

## 2020-11-11 DIAGNOSIS — N926 Irregular menstruation, unspecified: Secondary | ICD-10-CM | POA: Diagnosis not present

## 2020-11-11 MED ORDER — SHINGRIX 50 MCG/0.5ML IM SUSR
0.5000 mL | Freq: Once | INTRAMUSCULAR | 0 refills | Status: AC
  Filled 2020-11-11: qty 0.5, 1d supply, fill #0

## 2020-11-11 MED ORDER — CONTRAVE 8-90 MG PO TB12
2.0000 | ORAL_TABLET | Freq: Two times a day (BID) | ORAL | 2 refills | Status: DC
  Filled 2020-11-11 – 2020-12-24 (×2): qty 120, 30d supply, fill #0
  Filled 2021-02-24: qty 12, 3d supply, fill #1
  Filled 2021-02-25: qty 108, 27d supply, fill #1
  Filled 2021-04-24: qty 20, 5d supply, fill #2
  Filled 2021-04-27 (×2): qty 100, 25d supply, fill #2
  Filled 2021-04-27: qty 20, 5d supply, fill #2

## 2020-11-11 NOTE — Patient Instructions (Signed)
Obesity, Adult Obesity is having too much body fat. Being obese means that your weight is morethan what is healthy for you. BMI is a number that explains how much body fat you have. If you have a BMI of 30 or more, you are obese. Obesity is often caused by eating or drinking morecalories than your body uses. Changing your lifestyle can help you lose weight. Obesity can cause serious health problems, such as: Stroke. Coronary artery disease (CAD). Type 2 diabetes. Some types of cancer, including cancers of the colon, breast, uterus, and gallbladder. Osteoarthritis. High blood pressure (hypertension). High cholesterol. Sleep apnea. Gallbladder stones. Infertility problems. What are the causes? Eating meals each day that are high in calories, sugar, and fat. Being born with genes that may make you more likely to become obese. Having a medical condition that causes obesity. Taking certain medicines. Sitting a lot (having a sedentary lifestyle). Not getting enough sleep. Drinking a lot of drinks that have sugar in them. What increases the risk? Having a family history of obesity. Being an [place] American woman. Being a Hispanic man. Living in an area with limited access to: [place], recreation centers, or sidewalks. Healthy food choices, such as grocery stores and farmers' markets. What are the signs or symptoms? The main sign is having too much body fat. How is this treated? Treatment for this condition often includes changing your lifestyle. Treatment may include: Changing your diet. This may include making a healthy meal plan. Exercise. This may include activity that causes your heart to beat faster (aerobic exercise) and strength training. Work with your doctor to design a program that works for you. Medicine to help you lose weight. This may be used if you are not able to lose 1 pound a week after 6 weeks of healthy eating and more exercise. Treating conditions that cause the  obesity. Surgery. Options may include gastric banding and gastric bypass. This may be done if: Other treatments have not helped to improve your condition. You have a BMI of 40 or higher. You have life-threatening health problems related to obesity. Follow these instructions at home: Eating and drinking  Follow advice from your doctor about what to eat and drink. Your doctor may tell you to: Limit fast food, sweets, and processed snack foods. Choose low-fat options. For example, choose low-fat milk instead of whole milk. Eat 5 or more servings of fruits or vegetables each day. Eat at home more often. This gives you more control over what you eat. Choose healthy foods when you eat out. Learn to read food labels. This will help you learn how much food is in 1 serving. Keep low-fat snacks available. Avoid drinks that have a lot of sugar in them. These include soda, fruit juice, iced tea with sugar, and flavored milk. Drink enough water to keep your pee (urine) pale yellow. Do not go on fad diets.  Physical activity Exercise often, as told by your doctor. Most adults should get up to 150 minutes of moderate-intensity exercise every week.Ask your doctor: What types of exercise are safe for you. How often you should exercise. Warm up and stretch before being active. Do slow stretching after being active (cool down). Rest between times of being active. Lifestyle Work with your doctor and a food expert (dietitian) to set a weight-loss goal that is best for you. Limit your screen time. Find ways to reward yourself that do not involve food. Do not drink alcohol if: Your doctor tells you not to drink.  You are pregnant, may be pregnant, or are planning to become pregnant. If you drink alcohol: Limit how much you use to: 0-1 drink a day for women. 0-2 drinks a day for men. Be aware of how much alcohol is in your drink. In the [place] one drink equals one 12 oz bottle of beer (355 mL), one 5 oz  glass of wine (148 mL), or one 1 oz glass of hard liquor (44 mL). General instructions Keep a weight-loss journal. This can help you keep track of: The food that you eat. How much exercise you get. Take over-the-counter and prescription medicines only as told by your doctor. Take vitamins and supplements only as told by your doctor. Think about joining a support group. Keep all follow-up visits as told by your doctor. This is important. Contact a doctor if: You cannot meet your weight loss goal after you have changed your diet and lifestyle for 6 weeks. Get help right away if you: Are having trouble breathing. Are having thoughts of harming yourself. Summary Obesity is having too much body fat. Being obese means that your weight is more than what is healthy for you. Work with your doctor to set a weight-loss goal. Get regular exercise as told by your doctor. This information is not intended to replace advice given to you by your health care provider. Make sure you discuss any questions you have with your healthcare provider. Document Revised: 11/24/2017 Document Reviewed: 11/24/2017 Elsevier Patient Education  2022 [employer].

## 2020-11-11 NOTE — Progress Notes (Signed)
I,Tianna Badgett,acting as a Education administrator for [name], MD.,have documented all relevant documentation on the behalf of [name], MD,as directed by  [name], MD while in the presence of [name], MD.  This visit occurred during the SARS-CoV-2 public health emergency.  Safety protocols were in place, including screening questions prior to the visit, additional usage of staff PPE, and extensive cleaning of exam room while observing appropriate contact time as indicated for disinfecting solutions.  Subjective:     Patient ID: Mackenzie Clark , female    DOB: 18-Sep-1965 , 55 y.o.   MRN: [id number]   Chief Complaint  Patient presents with   Weight Check   Hypertension    HPI  She presents today for BP f/u and weight check. She has been taking Contrave without any issues.  She reports compliance with meds. She denies headaches, chest pain and shortness of breath.   Hypertension This is a chronic problem. The current episode started more than 1 year ago. The problem has been gradually improving since onset. The problem is controlled.    Past Medical History:  Diagnosis Date   Allergy    Diabetes mellitus without complication ([place])    diet and exercise controlled   Hypertension      Family History  Problem Relation Age of Onset   Hypertension Mother    Hyperlipidemia Mother    Hypertension Father    Diabetes Father    Heart disease Father        heart attack age 4   Hypertension Brother    Cancer Maternal Grandmother 79       Breast   Obesity Other    Colon cancer Neg Hx    Colon polyps Neg Hx    Esophageal cancer Neg Hx    Rectal cancer Neg Hx    Stomach cancer Neg Hx      Current Outpatient Medications:    clotrimazole-betamethasone (LOTRISONE) cream, APPLY TO AFFECTED AREA 2 TIMES DAILY AS NEEDED, Disp: 15 g, Rfl: 1   Dulaglutide 3 MG/0.5ML SOPN, INJECT 3 MG AS DIRECTED ONCE A WEEK., Disp: 6 mL, Rfl: 3   glucose blood test strip, 1 each by  Other route 2 (two) times daily. Use as instructed, Disp: , Rfl:    olmesartan-hydrochlorothiazide (BENICAR HCT) 40-25 MG tablet, TAKE 1 TABLET BY MOUTH ONCE DAILY, Disp: 90 tablet, Rfl: 1   valACYclovir (VALTREX) 1000 MG tablet, TAKE 1 TABLET BY MOUTH DAILY AS NEEDED AS DIRECTED, Disp: 90 tablet, Rfl: 1   Naltrexone-buPROPion HCl ER (CONTRAVE) 8-90 MG TB12, TAKE 2 TABLETS BY MOUTH TWICE DAILY, Disp: 120 tablet, Rfl: 2   No Known Allergies   Review of Systems  Constitutional: Negative.   Respiratory: Negative.    Cardiovascular: Negative.   Gastrointestinal: Negative.   Genitourinary:        She reports having irregular cycles. She has had uterine artery embolization. She has yet to discuss this with her GYN.   Neurological: Negative.     Today's Vitals   11/11/20 0834  BP: 128/72  Pulse: 76  Temp: 97.8 F (36.6 C)  TempSrc: Oral  Weight: 187 lb (84.8 kg)  Height: '5\' 7"'$  (1.702 m)   Body mass index is 29.29 kg/m.  Wt Readings from Last 3 Encounters:  11/11/20 187 lb (84.8 kg)  08/11/20 188 lb (85.3 kg)  07/31/20 186 lb 9.6 oz (84.6 kg)   BP Readings from Last 3 Encounters:  11/11/20 128/72  08/11/20 137/84  07/31/20 134/90    Objective:  Physical Exam Vitals and nursing note reviewed.  Constitutional:      Appearance: Normal appearance.  HENT:     Head: Normocephalic and atraumatic.     Nose:     Comments: Masked     Mouth/Throat:     Comments: Masked  Eyes:     Extraocular Movements: Extraocular movements intact.  Cardiovascular:     Rate and Rhythm: Normal rate and regular rhythm.     Heart sounds: Normal heart sounds.  Pulmonary:     Effort: Pulmonary effort is normal.     Breath sounds: Normal breath sounds.  Musculoskeletal:     Cervical back: Normal range of motion.  Skin:    General: Skin is warm.  Neurological:     General: No focal deficit present.     Mental Status: She is alert.  Psychiatric:        Mood and Affect: Mood normal.         Behavior: Behavior normal.        Assessment And Plan:     1. Essential hypertension, benign Comments: Chronic, well controlled. She is encouraged to follow low sodium diet. NO labs today.   2. Overweight with body mass index (BMI) of 29 to 29.9 in adult Comments: She is encouraged to incorporate more strength training into her exercise routine.  3. Irregular menses Comments: I will check [place] at her next visit.  Pt advised she is perimenopausal.   4. Immunization due Comments: I will send rx Shingrix to her local pharmacy.  Patient was given opportunity to ask questions. Patient verbalized understanding of the plan and was able to repeat key elements of the plan. All questions were answered to their satisfaction.   I, [name], MD, have reviewed all documentation for this visit. The documentation on 11/11/20 for the exam, diagnosis, procedures, and orders are all accurate and complete.   IF YOU HAVE BEEN REFERRED TO A SPECIALIST, IT MAY TAKE 1-2 WEEKS TO SCHEDULE/PROCESS THE REFERRAL. IF YOU HAVE NOT HEARD FROM US/SPECIALIST IN TWO WEEKS, PLEASE GIVE [place] A CALL AT [phone] X [phone].   THE PATIENT IS ENCOURAGED TO PRACTICE SOCIAL DISTANCING DUE TO THE COVID-19 PANDEMIC.

## 2020-12-24 ENCOUNTER — Other Ambulatory Visit (HOSPITAL_COMMUNITY): Payer: Self-pay

## 2020-12-24 MED ORDER — ZOSTER VAC RECOMB ADJUVANTED 50 MCG/0.5ML IM SUSR
INTRAMUSCULAR | 1 refills | Status: DC
  Filled 2020-12-24: qty 1, 1d supply, fill #0

## 2020-12-25 ENCOUNTER — Encounter: Payer: Self-pay | Admitting: Internal Medicine

## 2020-12-26 ENCOUNTER — Other Ambulatory Visit (HOSPITAL_COMMUNITY): Payer: Self-pay

## 2020-12-29 ENCOUNTER — Encounter: Payer: Self-pay | Admitting: Internal Medicine

## 2021-01-19 MED FILL — Dulaglutide Soln Auto-injector 3 MG/0.5ML: SUBCUTANEOUS | 84 days supply | Qty: 6 | Fill #1 | Status: AC

## 2021-01-20 ENCOUNTER — Other Ambulatory Visit (HOSPITAL_COMMUNITY): Payer: Self-pay

## 2021-01-22 ENCOUNTER — Other Ambulatory Visit (HOSPITAL_COMMUNITY): Payer: Self-pay

## 2021-01-22 ENCOUNTER — Other Ambulatory Visit: Payer: Self-pay

## 2021-01-22 ENCOUNTER — Encounter: Payer: Self-pay | Admitting: Internal Medicine

## 2021-01-22 ENCOUNTER — Ambulatory Visit (INDEPENDENT_AMBULATORY_CARE_PROVIDER_SITE_OTHER): Payer: No Typology Code available for payment source | Admitting: Internal Medicine

## 2021-01-22 VITALS — BP 144/90 | HR 71 | Temp 98.5°F | Ht 67.0 in | Wt 187.0 lb

## 2021-01-22 DIAGNOSIS — F40243 Fear of flying: Secondary | ICD-10-CM | POA: Diagnosis not present

## 2021-01-22 DIAGNOSIS — E1159 Type 2 diabetes mellitus with other circulatory complications: Secondary | ICD-10-CM

## 2021-01-22 DIAGNOSIS — N926 Irregular menstruation, unspecified: Secondary | ICD-10-CM | POA: Diagnosis not present

## 2021-01-22 DIAGNOSIS — J301 Allergic rhinitis due to pollen: Secondary | ICD-10-CM

## 2021-01-22 DIAGNOSIS — E669 Obesity, unspecified: Secondary | ICD-10-CM

## 2021-01-22 DIAGNOSIS — I152 Hypertension secondary to endocrine disorders: Secondary | ICD-10-CM

## 2021-01-22 DIAGNOSIS — E1169 Type 2 diabetes mellitus with other specified complication: Secondary | ICD-10-CM

## 2021-01-22 DIAGNOSIS — E663 Overweight: Secondary | ICD-10-CM

## 2021-01-22 DIAGNOSIS — Z6829 Body mass index (BMI) 29.0-29.9, adult: Secondary | ICD-10-CM

## 2021-01-22 MED ORDER — MOMETASONE FUROATE 50 MCG/ACT NA SUSP
NASAL | 2 refills | Status: DC
  Filled 2021-01-22 – 2021-05-26 (×2): qty 51, 90d supply, fill #0

## 2021-01-22 MED ORDER — DIAZEPAM 2 MG PO TABS
ORAL_TABLET | ORAL | 0 refills | Status: DC
  Filled 2021-01-22: qty 10, 5d supply, fill #0

## 2021-01-22 NOTE — Progress Notes (Signed)
I,[name],acting as a Education administrator for [name], MD.,have documented all relevant documentation on the behalf of [name], MD,as directed by  [name], MD while in the presence of [name], MD.  This visit occurred during the SARS-CoV-2 public health emergency.  Safety protocols were in place, including screening questions prior to the visit, additional usage of staff PPE, and extensive cleaning of exam room while observing appropriate contact time as indicated for disinfecting solutions.  Subjective:     Patient ID: Mackenzie Clark , female    DOB: [birth year] , 55 y.o.   MRN: [id number]   Chief Complaint  Patient presents with   Obesity    HPI  She presents today for a DM, HTN, weight f/u. She is currently taking Trulicity. She has no specific concerns or complaints at this time.  Diabetes She presents for her follow-up diabetic visit. She has type 2 diabetes mellitus. Her disease course has been stable. There are no hypoglycemic associated symptoms. Pertinent negatives for diabetes include no blurred vision and no chest pain. There are no hypoglycemic complications. Risk factors for coronary artery disease include diabetes mellitus, hypertension and obesity. She is compliant with treatment most of the time. She participates in exercise intermittently. An ACE inhibitor/angiotensin II receptor blocker is being taken. Eye exam is current.  Hypertension This is a chronic problem. The current episode started more than 1 year ago. The problem has been gradually improving since onset. The problem is controlled. Pertinent negatives include no blurred vision or chest pain.    Past Medical History:  Diagnosis Date   Allergy    Diabetes mellitus without complication ([place])    diet and exercise controlled   Hypertension      Family History  Problem Relation Age of Onset   Hypertension Mother    Hyperlipidemia Mother    Hypertension Father    Diabetes Father     Heart disease Father        heart attack age 73   Hypertension Brother    Cancer Maternal Grandmother 79       Breast   Obesity Other    Colon cancer Neg Hx    Colon polyps Neg Hx    Esophageal cancer Neg Hx    Rectal cancer Neg Hx    Stomach cancer Neg Hx      Current Outpatient Medications:    clotrimazole-betamethasone (LOTRISONE) cream, APPLY TO AFFECTED AREA 2 TIMES DAILY AS NEEDED, Disp: 15 g, Rfl: 1   diazepam (VALIUM) 2 MG tablet, Take one tablet by mouth one hour prior to flight, can repeat as needed, Disp: 10 tablet, Rfl: 0   Dulaglutide 3 MG/0.5ML SOPN, INJECT 3 MG AS DIRECTED ONCE A WEEK., Disp: 6 mL, Rfl: 3   glucose blood test strip, 1 each by Other route 2 (two) times daily. Use as instructed, Disp: , Rfl:    Naltrexone-buPROPion HCl ER (CONTRAVE) 8-90 MG TB12, TAKE 2 TABLETS BY MOUTH TWICE DAILY, Disp: 120 tablet, Rfl: 2   olmesartan-hydrochlorothiazide (BENICAR HCT) 40-25 MG tablet, TAKE 1 TABLET BY MOUTH ONCE DAILY, Disp: 90 tablet, Rfl: 1   valACYclovir (VALTREX) 1000 MG tablet, TAKE 1 TABLET BY MOUTH DAILY AS NEEDED AS DIRECTED, Disp: 90 tablet, Rfl: 1   Zoster Vaccine Adjuvanted [facility]) injection, To be administered by the pharmacist, Disp: 1 each, Rfl: 1   mometasone (NASONEX) 50 MCG/ACT nasal spray, INSTILL 2 SPRAYS INTO THE NOSE DAILY, Disp: 51 g, Rfl: 2  No Known Allergies   Review of Systems  Constitutional: Negative.   Eyes:  Negative for blurred vision.  Respiratory: Negative.    Cardiovascular: Negative.  Negative for chest pain.  Gastrointestinal: Negative.   Psychiatric/Behavioral: Negative.         She is going on a trip to [place] in the next week or so. She has a fear of flying. She would like something to relax her for the trip.   All other systems reviewed and are negative.   Today's Vitals   01/22/21 0901 01/22/21 0921  BP: (!) 156/92 (!) 144/90  Pulse: 71   Temp: 98.5 F (36.9 C)   Weight: 187 lb (84.8 kg)   Height: 5' 7" (1.702 m)     Body mass index is 29.29 kg/m.  Wt Readings from Last 3 Encounters:  01/22/21 187 lb (84.8 kg)  11/11/20 187 lb (84.8 kg)  08/11/20 188 lb (85.3 kg)    BP Readings from Last 3 Encounters:  01/22/21 (!) 144/90  11/11/20 128/72  08/11/20 137/84    Objective:  Physical Exam Vitals and nursing note reviewed.  Constitutional:      Appearance: Normal appearance.  HENT:     Head: Normocephalic and atraumatic.     Nose:     Comments: Masked     Mouth/Throat:     Comments: Masked  Eyes:     Extraocular Movements: Extraocular movements intact.  Cardiovascular:     Rate and Rhythm: Normal rate and regular rhythm.     Heart sounds: Normal heart sounds.  Pulmonary:     Effort: Pulmonary effort is normal.     Breath sounds: Normal breath sounds.  Musculoskeletal:     Cervical back: Normal range of motion.  Skin:    General: Skin is warm.  Neurological:     General: No focal deficit present.     Mental Status: She is alert.  Psychiatric:        Mood and Affect: Mood normal.        Behavior: Behavior normal.        Assessment And Plan:     1. Obesity, diabetes, and hypertension syndrome ([place]) Comments: We plan to switch her from Trulicity to Ozempic. She will complete her current 90 day supplyof Trulicity prior to this switch. BP uncontrolled today. Repeat BP reading was improved, down to 144/90. Admits she was a bit frustrated just prior to her visit. No BP med changes today. Will reassess at next visit. Can get BP checked at work, encouraged to notify me if her BP is persistently elevated.  - CMP14+EGFR - Insulin, random(561) - Lipid panel  2. Phobia, flying Comments: I will send rx Valium to pharmacy. She is encouraged to try initial dose this weekend to ensure that she tolerates the medication.   3. Irregular menses Comments: I will check [place] to document she is postmenopausal. She is s/p [place]. - FSH  4. Seasonal allergic rhinitis due to pollen Comments: I will  refill rx Nasonex.   5. Overweight with body mass index (BMI) of 29 to 29.9 in adult Comments: She was commended for NOT gaining any weight since last visit. I think once she is switched to Wegovy/Ozempic from Trulicity, she may have improved success. I think we will be able to stop Contrave at that time, we will need to gradually wean her off of this medication.   Patient was given opportunity to ask questions. Patient verbalized understanding of the plan and was  able to repeat key elements of the plan. All questions were answered to their satisfaction.   I, [name], MD, have reviewed all documentation for this visit. The documentation on 01/22/21 for the exam, diagnosis, procedures, and orders are all accurate and complete.   IF YOU HAVE BEEN REFERRED TO A SPECIALIST, IT MAY TAKE 1-2 WEEKS TO SCHEDULE/PROCESS THE REFERRAL. IF YOU HAVE NOT HEARD FROM US/SPECIALIST IN TWO WEEKS, PLEASE GIVE [place] A CALL AT [phone].   THE PATIENT IS ENCOURAGED TO PRACTICE SOCIAL DISTANCING DUE TO THE COVID-19 PANDEMIC.

## 2021-01-22 NOTE — Patient Instructions (Signed)

## 2021-01-23 ENCOUNTER — Other Ambulatory Visit (HOSPITAL_COMMUNITY): Payer: Self-pay

## 2021-01-23 LAB — CMP14+EGFR
ALT: 18 IU/L (ref 0–32)
AST: 18 IU/L (ref 0–40)
Albumin/Globulin Ratio: 2.3 — ABNORMAL HIGH (ref 1.2–2.2)
Albumin: 5.1 g/dL — ABNORMAL HIGH (ref 3.8–4.9)
Alkaline Phosphatase: 95 IU/L (ref 44–121)
BUN/Creatinine Ratio: 10 (ref 9–23)
BUN: 8 mg/dL (ref 6–24)
Bilirubin Total: 0.5 mg/dL (ref 0.0–1.2)
CO2: 32 mmol/L — ABNORMAL HIGH (ref 20–29)
Calcium: 10 mg/dL (ref 8.7–10.2)
Chloride: 99 mmol/L (ref 96–106)
Creatinine, Ser: 0.84 mg/dL (ref 0.57–1.00)
Globulin, Total: 2.2 g/dL (ref 1.5–4.5)
Glucose: 84 mg/dL (ref 70–99)
Potassium: 3.5 mmol/L (ref 3.5–5.2)
Sodium: 143 mmol/L (ref 134–144)
Total Protein: 7.3 g/dL (ref 6.0–8.5)
eGFR: 84 mL/min/{1.73_m2} (ref >59)

## 2021-01-23 LAB — FOLLICLE STIMULATING HORMONE: FSH: 63.7 m[IU]/mL

## 2021-01-23 LAB — LIPID PANEL
Chol/HDL Ratio: 2.7 ratio (ref 0.0–4.4)
Cholesterol, Total: 252 mg/dL — ABNORMAL HIGH (ref 100–199)
HDL: 94 mg/dL (ref >39)
LDL Chol Calc (NIH): 151 mg/dL — ABNORMAL HIGH (ref 0–99)
Triglycerides: 46 mg/dL (ref 0–149)
VLDL Cholesterol Cal: 7 mg/dL (ref 5–40)

## 2021-01-23 LAB — INSULIN, RANDOM: INSULIN: 3.8 u[IU]/mL (ref 2.6–24.9)

## 2021-01-27 ENCOUNTER — Encounter: Payer: Self-pay | Admitting: Internal Medicine

## 2021-02-13 ENCOUNTER — Other Ambulatory Visit (HOSPITAL_COMMUNITY): Payer: Self-pay

## 2021-02-14 LAB — HM MAMMOGRAPHY

## 2021-02-18 ENCOUNTER — Encounter: Payer: Self-pay | Admitting: Internal Medicine

## 2021-02-24 ENCOUNTER — Other Ambulatory Visit (HOSPITAL_COMMUNITY): Payer: Self-pay

## 2021-02-25 ENCOUNTER — Other Ambulatory Visit (HOSPITAL_COMMUNITY): Payer: Self-pay

## 2021-02-27 ENCOUNTER — Other Ambulatory Visit (HOSPITAL_COMMUNITY): Payer: Self-pay

## 2021-04-09 ENCOUNTER — Encounter: Payer: No Typology Code available for payment source | Admitting: Internal Medicine

## 2021-04-24 ENCOUNTER — Other Ambulatory Visit (HOSPITAL_COMMUNITY): Payer: Self-pay

## 2021-04-27 ENCOUNTER — Other Ambulatory Visit (HOSPITAL_COMMUNITY): Payer: Self-pay

## 2021-04-28 ENCOUNTER — Other Ambulatory Visit (HOSPITAL_COMMUNITY): Payer: Self-pay

## 2021-05-06 ENCOUNTER — Other Ambulatory Visit (HOSPITAL_COMMUNITY): Payer: Self-pay

## 2021-05-08 ENCOUNTER — Other Ambulatory Visit (HOSPITAL_COMMUNITY): Payer: Self-pay

## 2021-05-08 ENCOUNTER — Telehealth: Payer: No Typology Code available for payment source | Admitting: Family Medicine

## 2021-05-08 DIAGNOSIS — R350 Frequency of micturition: Secondary | ICD-10-CM | POA: Diagnosis not present

## 2021-05-08 MED ORDER — NITROFURANTOIN MONOHYD MACRO 100 MG PO CAPS
100.0000 mg | ORAL_CAPSULE | Freq: Two times a day (BID) | ORAL | 0 refills | Status: AC
Start: 2021-05-08 — End: 2021-05-13
  Filled 2021-05-08: qty 10, 5d supply, fill #0

## 2021-05-08 NOTE — Patient Instructions (Signed)

## 2021-05-08 NOTE — Progress Notes (Signed)
Virtual Visit Consent   Gary Bultman, you are scheduled for a virtual visit with a [facility] provider today.     Just as with appointments in the office, your consent must be obtained to participate.  Your consent will be active for this visit and any virtual visit you may have with one of our providers in the next 365 days.     If you have a MyChart account, a copy of this consent can be sent to you electronically.  All virtual visits are billed to your insurance company just like a traditional visit in the office.    As this is a virtual visit, video technology does not allow for your provider to perform a traditional examination.  This may limit your provider's ability to fully assess your condition.  If your provider identifies any concerns that need to be evaluated in person or the need to arrange testing (such as labs, EKG, etc.), we will make arrangements to do so.     Although advances in technology are sophisticated, we cannot ensure that it will always work on either your end or our end.  If the connection with a video visit is poor, the visit may have to be switched to a telephone visit.  With either a video or telephone visit, we are not always able to ensure that we have a secure connection.     I need to obtain your verbal consent now.   Are you willing to proceed with your visit today?    Laurine Kuyper has provided verbal consent on 05/08/2021 for a virtual visit (video or telephone).   Perlie Mayo, NP   Date: 05/08/2021 1:15 PM   Virtual Visit via Video Note   I, Perlie Mayo, connected with  Ivonna Kinnick  ([id number], 01-28-1966) on 05/08/21 at  1:15 PM EST by a video-enabled telemedicine application and verified that I am speaking with the correct person using two identifiers.  Location: Patient: Virtual Visit Location Patient: Home Provider: Virtual Visit Location Provider: Home Office   I discussed the limitations of evaluation and management by  telemedicine and the availability of in person appointments. The patient expressed understanding and agreed to proceed.    History of Present Illness: Mackenzie Clark is a 56 y.o. who identifies as a female who was assigned female at birth, and is being seen today for UTI symptoms, some back pain, drank a lot of water and it cleared up and she thought that was cause of increased in urination. Marland Kitchen  HPI: Urinary Tract Infection  This is a new problem. The current episode started in the past 7 days. The problem has been gradually worsening. The quality of the pain is described as aching. The pain is at a severity of 4/10. The pain is moderate. There has been no fever. She is Not sexually active. There is No history of pyelonephritis. Associated symptoms include flank pain, frequency and urgency. Pertinent negatives include no chills, discharge, hematuria, hesitancy, nausea, possible pregnancy, sweats or vomiting. She has tried acetaminophen, NSAIDs and increased fluids for the symptoms. The treatment provided moderate relief.   Problems:  Patient Active Problem List   Diagnosis Date Noted   Overweight with body mass index (BMI) of 29 to 29.9 in adult 01/22/2021   Obesity, diabetes, and hypertension syndrome ([place]) 01/22/2021   Fibroids 08/31/2019   Abnormal uterine bleeding due to intramural leiomyoma 08/31/2019   Abnormal uterine bleeding ([place]) 05/21/2019   Uterine leiomyoma  05/21/2019   Body mass index 33.0-33.9, adult 09/03/2017   Type II diabetes mellitus, uncontrolled 09/03/2017   Obesity 09/03/2017   Essential hypertension, benign 03/05/2013   Surveillance of previously prescribed contraceptive pill 03/05/2013    Allergies: No Known Allergies Medications:  Current Outpatient Medications:    diazepam (VALIUM) 2 MG tablet, Take one tablet by mouth one hour prior to flight, can repeat as needed, Disp: 10 tablet, Rfl: 0   glucose blood test strip, 1 each by Other route 2 (two) times daily. Use  as instructed, Disp: , Rfl:    mometasone (NASONEX) 50 MCG/ACT nasal spray, INSTILL 2 SPRAYS INTO THE NOSE DAILY, Disp: 51 g, Rfl: 2   Naltrexone-buPROPion HCl ER (CONTRAVE) 8-90 MG TB12, TAKE 2 TABLETS BY MOUTH TWICE DAILY, Disp: 120 tablet, Rfl: 2   olmesartan-hydrochlorothiazide (BENICAR HCT) 40-25 MG tablet, TAKE 1 TABLET BY MOUTH ONCE DAILY, Disp: 90 tablet, Rfl: 1   Zoster Vaccine Adjuvanted [facility]) injection, To be administered by the pharmacist, Disp: 1 each, Rfl: 1  Observations/Objective: Patient is well-developed, well-nourished in no acute distress.  Resting comfortably  at home.  Head is normocephalic, atraumatic.  No labored breathing.  Speech is clear and coherent with logical content.  Patient is alert and oriented at baseline.   Assessment and Plan: 1. Frequency of urination Likely start or and or on going UTI  Overall no fevers, pain with voiding, or discomfort outside some mild left flank pain Will start Macrobid with strict ED follow up if fevers, pain or symtpoms change  Diff: Stone vs Pyelonephritis    Reviewed side effects, risks and benefits of medication.    Patient acknowledged agreement and understanding of the plan.    - nitrofurantoin, macrocrystal-monohydrate, (MACROBID) 100 MG capsule; Take 1 capsule (100 mg total) by mouth 2 (two) times daily for 5 days.  Dispense: 10 capsule; Refill: 0   Follow Up Instructions: I discussed the assessment and treatment plan with the patient. The patient was provided an opportunity to ask questions and all were answered. The patient agreed with the plan and demonstrated an understanding of the instructions.  A copy of instructions were sent to the patient via MyChart unless otherwise noted below.     The patient was advised to call back or seek an in-person evaluation if the symptoms worsen or if the condition fails to improve as anticipated.  Time:  I spent 10 minutes with the patient via telehealth technology  discussing the above problems/concerns.    Perlie Mayo, NP

## 2021-05-25 ENCOUNTER — Telehealth: Payer: Self-pay

## 2021-05-25 NOTE — Telephone Encounter (Signed)
I left the pt a detailed message that she is due for an eye exam.

## 2021-05-26 ENCOUNTER — Other Ambulatory Visit: Payer: Self-pay

## 2021-05-26 ENCOUNTER — Other Ambulatory Visit: Payer: Self-pay | Admitting: Internal Medicine

## 2021-05-26 ENCOUNTER — Other Ambulatory Visit (HOSPITAL_COMMUNITY): Payer: Self-pay

## 2021-05-26 ENCOUNTER — Encounter: Payer: Self-pay | Admitting: Internal Medicine

## 2021-05-26 DIAGNOSIS — I10 Essential (primary) hypertension: Secondary | ICD-10-CM

## 2021-05-26 MED ORDER — TRULICITY 3 MG/0.5ML ~~LOC~~ SOAJ
SUBCUTANEOUS | 0 refills | Status: DC
  Filled 2021-05-26: qty 2, 28d supply, fill #0

## 2021-05-26 MED ORDER — OLMESARTAN MEDOXOMIL-HCTZ 40-25 MG PO TABS
1.0000 | ORAL_TABLET | Freq: Every day | ORAL | 1 refills | Status: DC
  Filled 2021-05-26: qty 90, 90d supply, fill #0
  Filled 2021-08-18: qty 90, 90d supply, fill #1

## 2021-05-26 MED ORDER — TRULICITY 3 MG/0.5ML ~~LOC~~ SOAJ
SUBCUTANEOUS | 3 refills | Status: DC
  Filled 2021-05-26: qty 6, 84d supply, fill #0

## 2021-05-26 MED ORDER — MOMETASONE FUROATE 50 MCG/ACT NA SUSP
NASAL | 2 refills | Status: DC
  Filled 2021-05-26: qty 51, 90d supply, fill #0

## 2021-05-27 ENCOUNTER — Other Ambulatory Visit: Payer: Self-pay | Admitting: Internal Medicine

## 2021-05-27 ENCOUNTER — Other Ambulatory Visit (HOSPITAL_COMMUNITY): Payer: Self-pay

## 2021-05-29 ENCOUNTER — Other Ambulatory Visit (HOSPITAL_COMMUNITY): Payer: Self-pay

## 2021-06-17 ENCOUNTER — Encounter: Payer: No Typology Code available for payment source | Admitting: Internal Medicine

## 2021-06-20 LAB — HM DIABETES EYE EXAM

## 2021-06-23 ENCOUNTER — Telehealth: Payer: Self-pay

## 2021-06-23 NOTE — Telephone Encounter (Signed)
I called pt to see if she has scheduled an eye exam. I left her a vm to call the office YL,RMA ?

## 2021-07-01 ENCOUNTER — Encounter: Payer: No Typology Code available for payment source | Admitting: Internal Medicine

## 2021-07-13 ENCOUNTER — Ambulatory Visit (INDEPENDENT_AMBULATORY_CARE_PROVIDER_SITE_OTHER): Payer: No Typology Code available for payment source | Admitting: Internal Medicine

## 2021-07-13 ENCOUNTER — Encounter: Payer: Self-pay | Admitting: Internal Medicine

## 2021-07-13 ENCOUNTER — Other Ambulatory Visit (HOSPITAL_COMMUNITY): Payer: Self-pay

## 2021-07-13 VITALS — BP 162/98 | HR 68 | Temp 98.4°F | Ht 67.0 in | Wt 198.6 lb

## 2021-07-13 DIAGNOSIS — E1159 Type 2 diabetes mellitus with other circulatory complications: Secondary | ICD-10-CM | POA: Diagnosis not present

## 2021-07-13 DIAGNOSIS — E6609 Other obesity due to excess calories: Secondary | ICD-10-CM | POA: Diagnosis not present

## 2021-07-13 DIAGNOSIS — E1169 Type 2 diabetes mellitus with other specified complication: Secondary | ICD-10-CM | POA: Diagnosis not present

## 2021-07-13 DIAGNOSIS — Z23 Encounter for immunization: Secondary | ICD-10-CM

## 2021-07-13 DIAGNOSIS — I152 Hypertension secondary to endocrine disorders: Secondary | ICD-10-CM | POA: Diagnosis not present

## 2021-07-13 DIAGNOSIS — Z6831 Body mass index (BMI) 31.0-31.9, adult: Secondary | ICD-10-CM

## 2021-07-13 LAB — POCT URINALYSIS DIPSTICK
Bilirubin, UA: NEGATIVE
Glucose, UA: NEGATIVE
Ketones, UA: NEGATIVE
Leukocytes, UA: NEGATIVE
Nitrite, UA: NEGATIVE
Protein, UA: NEGATIVE
Spec Grav, UA: 1.015 (ref 1.010–1.025)
Urobilinogen, UA: 0.2 E.U./dL
pH, UA: 7.5 (ref 5.0–8.0)

## 2021-07-13 MED ORDER — AMLODIPINE BESYLATE 5 MG PO TABS
5.0000 mg | ORAL_TABLET | Freq: Every day | ORAL | 1 refills | Status: DC
  Filled 2021-07-13: qty 90, 90d supply, fill #0
  Filled 2021-11-20: qty 90, 90d supply, fill #1

## 2021-07-13 MED ORDER — WEGOVY 1 MG/0.5ML ~~LOC~~ SOAJ
1.0000 mg | SUBCUTANEOUS | 0 refills | Status: DC
  Filled 2021-07-13: qty 2, 28d supply, fill #0

## 2021-07-13 MED ORDER — FLUTICASONE PROPIONATE 50 MCG/ACT NA SUSP
2.0000 | Freq: Every day | NASAL | 2 refills | Status: DC
  Filled 2021-07-13: qty 16, 30d supply, fill #0
  Filled 2021-11-20: qty 16, 30d supply, fill #1
  Filled 2022-01-13: qty 16, 30d supply, fill #2
  Filled 2022-06-28: qty 16, 30d supply, fill #3

## 2021-07-13 NOTE — Patient Instructions (Signed)
Cooking With Less Salt Cooking with less salt is one way to reduce the amount of sodium you get from food. Sodium is one of the elements that make up salt. It is found naturally in foods and is also added to certain foods. Depending on your condition and overall health, your health care provider or dietitian may recommend that you reduce your sodium intake. Most people should have less than 2,300 milligrams (mg) of sodium each day. If you have high blood pressure (hypertension), you may need to limit your sodium to 1,500 mg each day. Follow the tipsbelow to help reduce your sodium intake. What are tips for eating less sodium? Reading food labels  Check the food label before buying or using packaged ingredients. Always check the label for the serving size and sodium content. Look for products with no more than 140 mg of sodium in one serving. Check the % Daily Value column to see what percent of the daily recommended amount of sodium is provided in one serving of the product. Foods with 5% or less in this column are considered low in sodium. Foods with 20% or higher are considered high in sodium. Do not choose foods with salt as one of the first three ingredients on the ingredients list. If salt is one of the first three ingredients, it usually means the item is high in sodium.  Shopping Buy sodium-free or low-sodium products. Look for the following words on food labels: Low-sodium. Sodium-free. Reduced-sodium. No salt added. Unsalted. Always check the sodium content even if foods are labeled as low-sodium or no salt added. Buy fresh foods. Cooking Use herbs, seasonings without salt, and spices as substitutes for salt. Use sodium-free baking soda when baking. Grill, braise, or roast foods to add flavor with less salt. Avoid adding salt to pasta, rice, or hot cereals. Drain and rinse canned vegetables, beans, and meat before use. Avoid adding salt when cooking sweets and desserts. Cook with  low-sodium ingredients. What foods are high in sodium? Vegetables Regular canned vegetables (not low-sodium or reduced-sodium). Sauerkraut, pickled vegetables, and relishes. Olives. French fries. Onion rings. Regular canned tomato sauce and paste. Regular tomato and vegetable juice. Frozenvegetables in sauces. Grains Instant hot cereals. Bread stuffing, pancake, and biscuit mixes. Croutons. Seasoned rice or pasta mixes. Noodle soup cups. Boxed or frozen macaroni and cheese. Regular salted crackers. Self-rising flour. Rolls. Bagels. Flourtortillas and wraps. Meats and other proteins Meat or fish that is salted, canned, smoked, cured, spiced, or pickled. This includes bacon, ham, sausages, hot dogs, corned beef, chipped beef, meat loaves, salt pork, jerky, pickled herring, anchovies, regular canned tuna, andsardines. Salted nuts. Dairy Processed cheese and cheese spreads. Cheese curds. Blue cheese. Feta cheese.String cheese. Regular cottage cheese. Buttermilk. Canned milk. The items listed above may not be a complete list of foods high in sodium. Actual amounts of sodium may be different depending on processing. Contact a dietitian for more information. What foods are low in sodium? Fruits Fresh, frozen, or canned fruit with no sauce added. Fruit juice. Vegetables Fresh or frozen vegetables with no sauce added. "No salt added" canned vegetables. "No salt added" tomato sauce and paste. Low-sodium orreduced-sodium tomato and vegetable juice. Grains Noodles, pasta, quinoa, rice. Shredded or puffed wheat or puffed rice. Regular or quick oats (not instant). Low-sodium crackers. Low-sodium bread. Whole-grainbread and whole-grain pasta. Unsalted popcorn. Meats and other proteins Fresh or frozen whole meats, poultry (not injected with sodium), and fish with no sauce added. Unsalted nuts. Dried peas, beans, and   lentils without added salt. Unsalted canned beans. Eggs. Unsalted nut butters. Low-sodium canned  tunaor chicken. Dairy Milk. Soy milk. Yogurt. Low-sodium cheeses, such as Swiss, Monterey Jack, mozzarella, and ricotta. Sherbet or ice cream (keep to  cup per serving).Cream cheese. Fats and oils Unsalted butter or margarine. Other foods Homemade pudding. Sodium-free baking soda and baking powder. Herbs and spices.Low-sodium seasoning mixes. Beverages Coffee and tea. Carbonated beverages. The items listed above may not be a complete list of foods low in sodium. Actual amounts of sodium may be different depending on processing. Contact a dietitian for more information. What are some salt alternatives when cooking? The following are herbs, seasonings, and spices that can be used instead of salt to flavor your food. Herbs should be fresh or dried. Do not choose packaged mixes. Next to the name of the herb, spice, or seasoning aresome examples of foods you can pair it with. Herbs [place] leaves - Soups, meat and vegetable dishes, and spaghetti sauce. Basil - Italian dishes, soups, pasta, and fish dishes. Cilantro - Meat, poultry, and vegetable dishes. Chili powder - Marinades and Mexican dishes. Chives - Salad dressings and potato dishes. Cumin - Mexican dishes, couscous, and meat dishes. Dill - Fish dishes, sauces, and salads. Fennel - Meat and vegetable dishes, breads, and cookies. Garlic (do not use garlic salt) - Italian dishes, meat dishes, salad dressings, and sauces. Marjoram - Soups, potato dishes, and meat dishes. Oregano - Pizza and spaghetti sauce. Parsley - Salads, soups, pasta, and meat dishes. Rosemary - Italian dishes, salad dressings, soups, and red meats. Saffron - Fish dishes, pasta, and some poultry dishes. Sage - Stuffings and sauces. Tarragon - Fish and poultry dishes. Thyme - Stuffing, meat, and fish dishes. Seasonings Lemon juice - Fish dishes, poultry dishes, vegetables, and salads. Vinegar - Salad dressings, vegetables, and fish dishes. Spices Cinnamon - Sweet  dishes, such as cakes, cookies, and puddings. Cloves - Gingerbread, puddings, and marinades for meats. Curry - Vegetable dishes, fish and poultry dishes, and stir-fry dishes. Ginger - Vegetable dishes, fish dishes, and stir-fry dishes. Nutmeg - Pasta, vegetables, poultry, fish dishes, and custard. Summary Cooking with less salt is one way to reduce the amount of sodium that you get from food. Buy sodium-free or low-sodium products. Check the food label before using or buying packaged ingredients. Use herbs, seasonings without salt, and spices as substitutes for salt in foods. This information is not intended to replace advice given to you by your health care provider. Make sure you discuss any questions you have with your healthcare provider. Document Revised: 03/14/2019 Document Reviewed: 03/14/2019 Elsevier Patient Education  2022 [employer]  

## 2021-07-13 NOTE — Progress Notes (Signed)
?Mackenzie Clark as a Education administrator for Mackenzie Greenland, MD.,have documented all relevant documentation on the behalf of Mackenzie Greenland, MD,as directed by  Mackenzie Greenland, MD while in the presence of Mackenzie Greenland, MD.  ?This visit occurred during the SARS-CoV-2 public health emergency.  Safety protocols were in place, including screening questions prior to the visit, additional usage of staff PPE, and extensive cleaning of exam room while observing appropriate contact time as indicated for disinfecting solutions. ? ?Subjective:  ?  ? Patient ID: Mackenzie Clark , female    DOB: 1966-01-08 , 56 y.o.   MRN: [id number] ? ? ?Chief Complaint  ?Patient presents with  ? Hypertension  ? Diabetes  ? ? ?HPI ? ?She presents today for BP and DM f/u. She reports compliance with meds. She denies headaches, chest pain and shortness of breath. 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  ? ?Hypertension ?This is a chronic problem. The current episode started more than 1 year ago. The problem has been gradually improving since onset. The problem is controlled. Pertinent negatives include no blurred vision or chest pain.  ?Diabetes ?She presents for her follow-up diabetic visit. She has type 2 diabetes mellitus. Her disease course has been stable. There are no hypoglycemic associated symptoms. Pertinent negatives for diabetes include no blurred vision and no chest pain. There are no hypoglycemic complications. Risk factors for coronary artery disease include diabetes mellitus, hypertension and obesity. She is compliant with treatment most of the time. She participates in exercise intermittently. An ACE inhibitor/angiotensin II receptor blocker is being taken. Eye exam is current.   ? ?Past Medical History:  ?Diagnosis Date  ? Allergy   ? Diabetes mellitus without complication ([place])   ? diet and exercise controlled  ? Hypertension   ?  ? ?Family History  ?Problem Relation Age of Onset  ? Hypertension Mother   ?  Hyperlipidemia Mother   ? Hypertension Father   ? Diabetes Father   ? Heart disease Father   ?     heart attack age 75  ? Hypertension Brother   ? Cancer Maternal Grandmother 80  ?     Breast  ? Obesity Other   ? Colon cancer Neg Hx   ? Colon polyps Neg Hx   ? Esophageal cancer Neg Hx   ? Rectal cancer Neg Hx   ? Stomach cancer Neg Hx   ? ? ? ?Current Outpatient Medications:  ?  amLODipine (NORVASC) 5 MG tablet, Take 1 tablet (5 mg total) by mouth daily., Disp: 90 tablet, Rfl: 1 ?  fluticasone (FLONASE) 50 MCG/ACT nasal spray, Place 2 sprays into both nostrils daily., Disp: 54 g, Rfl: 2 ?  glucose blood test strip, 1 each by Other route 2 (two) times daily. Use as instructed, Disp: , Rfl:  ?  olmesartan-hydrochlorothiazide (BENICAR HCT) 40-25 MG tablet, TAKE 1 TABLET BY MOUTH ONCE DAILY, Disp: 90 tablet, Rfl: 1 ?  Semaglutide-Weight Management (WEGOVY) 1 MG/0.5ML SOAJ, Inject 1 mg into the skin once a week., Disp: 2 mL, Rfl: 0  ? ?No Known Allergies  ? ? Marland Kitchen The patient's tobacco use is:  ?Social History  ? ?Tobacco Use  ?Smoking Status Never  ?Smokeless Tobacco Never  ?Marland Kitchen She has been exposed to passive smoke. The patient's alcohol use is:  ?Social History  ? ?Substance and Sexual Activity  ?Alcohol Use Yes  ? Comment: occasionally  ? ?Review of Systems  ?Constitutional: Negative.   ?Eyes:  Negative for blurred vision.  ?Respiratory: Negative.    ?Cardiovascular: Negative.  Negative for chest pain.  ?Gastrointestinal: Negative.   ?Neurological: Negative.   ?Psychiatric/Behavioral: Negative.     ? ?Today's Vitals  ? 07/13/21 1225  ?BP: (!) 162/98  ?Pulse: 68  ?Temp: 98.4 ?F (36.9 ?C)  ?Weight: 198 lb 9.6 oz (90.1 kg)  ?Height: 5' 7"  (1.702 m)  ? ?Body mass index is 31.11 kg/m?.  ?Wt Readings from Last 3 Encounters:  ?07/13/21 198 lb 9.6 oz (90.1 kg)  ?01/22/21 187 lb (84.8 kg)  ?11/11/20 187 lb (84.8 kg)  ? ?BP Readings from Last 3 Encounters:  ?07/13/21 (!) 162/98  ?01/22/21 (!) 144/90  ?11/11/20 128/72   ?Marland Kitchen ?Objective:  ?Physical Exam ?Vitals and nursing note reviewed.  ?Constitutional:   ?   Appearance: Normal appearance.  ?HENT:  ?   Head: Normocephalic and atraumatic.  ?Eyes:  ?   Extraocular Movements: Extraocular movements intact.  ?Cardiovascular:  ?   Rate and Rhythm: Normal rate and regular rhythm.  ?   Heart sounds: Normal heart sounds.  ?Pulmonary:  ?   Effort: Pulmonary effort is normal.  ?   Breath sounds: Normal breath sounds.  ?Musculoskeletal:  ?   Cervical back: Normal range of motion.  ?Skin: ?   General: Skin is warm.  ?Neurological:  ?   General: No focal deficit present.  ?   Mental Status: She is alert.  ?Psychiatric:     ?   Mood and Affect: Mood normal.     ?   Behavior: Behavior normal.  ?   ?Assessment And Plan:  ?   ?1. Obesity, diabetes, and hypertension syndrome ([place]) ?Comments: Uncontrolled BP, I will add amlodipine 447m nightly. She will c/w olmesartan/hct in the mornings. I will check DM labs as below. She will f/u in 3 months.  ?- Hemoglobin A1c ?- Urine Albumin-Creatinine with uACR ?- CMP14+EGFR ?- Lipid panel ?- CBC no Diff ?- POCT Urinalysis Dipstick ((04888 ? ?2. Class 1 obesity due to excess calories with serious comorbidity and body mass index (BMI) of 31.0 to 31.9 in adult ?Comments: She is encouraged to aim for at least 150 minutes of exercise per week. We discussed use of Wegovy to assist with weight loss efforts. She understands she must stop Trulicity. I will start her on 0.582mWegovy weekly x 2 weeks, then increase to 47m24meekly. She will rto in 8-10 weeks for re-evaluation. She is aware of possible side effects. She denies family/personal h/o thyroid cancer. She understands that she will not start until Thursday. She is reminded to stop eating when full.  ? ?3. Need for vaccination ?Comments: She was given Shingrix IM x1. This will complete her series.  ?- Varicella-zoster vaccine IM (Shingrix) ? ?Patient was given opportunity to ask questions. Patient verbalized  understanding of the plan and was able to repeat key elements of the plan. All questions were answered to their satisfaction.  ? ?I, RobMaximino GreenlandD, have reviewed all documentation for this visit. The documentation on 07/13/21 for the exam, diagnosis, procedures, and orders are all accurate and complete.  ? ?THE PATIENT IS ENCOURAGED TO PRACTICE SOCIAL DISTANCING DUE TO THE COVID-19 PANDEMIC.   ?

## 2021-07-14 ENCOUNTER — Other Ambulatory Visit: Payer: Self-pay

## 2021-07-14 NOTE — Telephone Encounter (Signed)
Prior auth done for [facility] waiting on response from [employer]. ?

## 2021-07-15 LAB — CMP14+EGFR
ALT: 14 IU/L (ref 0–32)
AST: 15 IU/L (ref 0–40)
Albumin/Globulin Ratio: 2 (ref 1.2–2.2)
Albumin: 4.7 g/dL (ref 3.8–4.9)
Alkaline Phosphatase: 79 IU/L (ref 44–121)
BUN/Creatinine Ratio: 19 (ref 9–23)
BUN: 16 mg/dL (ref 6–24)
Bilirubin Total: 0.5 mg/dL (ref 0.0–1.2)
CO2: 31 mmol/L — ABNORMAL HIGH (ref 20–29)
Calcium: 10.2 mg/dL (ref 8.7–10.2)
Chloride: 102 mmol/L (ref 96–106)
Creatinine, Ser: 0.85 mg/dL (ref 0.57–1.00)
Globulin, Total: 2.4 g/dL (ref 1.5–4.5)
Glucose: 87 mg/dL (ref 70–99)
Potassium: 3.8 mmol/L (ref 3.5–5.2)
Sodium: 146 mmol/L — ABNORMAL HIGH (ref 134–144)
Total Protein: 7.1 g/dL (ref 6.0–8.5)
eGFR: 82 mL/min/{1.73_m2} (ref >59)

## 2021-07-15 LAB — CBC
Hematocrit: 37.8 % (ref 34.0–46.6)
Hemoglobin: 12.7 g/dL (ref 11.1–15.9)
MCH: 29.2 pg (ref 26.6–33.0)
MCHC: 33.6 g/dL (ref 31.5–35.7)
MCV: 87 fL (ref 79–97)
Platelets: 224 10*3/uL (ref 150–450)
RBC: 4.35 x10E6/uL (ref 3.77–5.28)
RDW: 13.3 % (ref 11.7–15.4)
WBC: 3.9 10*3/uL (ref 3.4–10.8)

## 2021-07-15 LAB — MICROALBUMIN / CREATININE URINE RATIO
Creatinine, Urine: 30.5 mg/dL
Microalb/Creat Ratio: <10 mg/g creat (ref 0–29)
Microalbumin, Urine: <3 ug/mL

## 2021-07-15 LAB — LIPID PANEL
Chol/HDL Ratio: 2.3 ratio (ref 0.0–4.4)
Cholesterol, Total: 211 mg/dL — ABNORMAL HIGH (ref 100–199)
HDL: 93 mg/dL (ref >39)
LDL Chol Calc (NIH): 108 mg/dL — ABNORMAL HIGH (ref 0–99)
Triglycerides: 55 mg/dL (ref 0–149)
VLDL Cholesterol Cal: 10 mg/dL (ref 5–40)

## 2021-07-15 LAB — HEMOGLOBIN A1C
Est. average glucose Bld gHb Est-mCnc: 117 mg/dL
Hgb A1c MFr Bld: 5.7 % — ABNORMAL HIGH (ref 4.8–5.6)

## 2021-07-16 ENCOUNTER — Telehealth: Payer: Self-pay

## 2021-07-16 NOTE — Telephone Encounter (Signed)
I left pt vm letting her know that her wegovy has been approved through 12/12/21. YL,RMA ?

## 2021-07-20 ENCOUNTER — Encounter: Payer: Self-pay | Admitting: Internal Medicine

## 2021-07-29 ENCOUNTER — Other Ambulatory Visit (HOSPITAL_COMMUNITY): Payer: Self-pay

## 2021-08-18 ENCOUNTER — Encounter: Payer: Self-pay | Admitting: Internal Medicine

## 2021-08-18 ENCOUNTER — Other Ambulatory Visit: Payer: Self-pay

## 2021-08-18 ENCOUNTER — Other Ambulatory Visit (HOSPITAL_COMMUNITY): Payer: Self-pay

## 2021-08-18 MED ORDER — WEGOVY 1.7 MG/0.75ML ~~LOC~~ SOAJ
1.7000 mg | SUBCUTANEOUS | 0 refills | Status: DC
  Filled 2021-08-18: qty 3, 28d supply, fill #0

## 2021-09-22 ENCOUNTER — Other Ambulatory Visit: Payer: Self-pay | Admitting: Internal Medicine

## 2021-09-23 ENCOUNTER — Other Ambulatory Visit (HOSPITAL_COMMUNITY): Payer: Self-pay

## 2021-09-23 MED ORDER — WEGOVY 1.7 MG/0.75ML ~~LOC~~ SOAJ
1.7000 mg | SUBCUTANEOUS | 0 refills | Status: DC
  Filled 2021-09-23: qty 3, 28d supply, fill #0

## 2021-10-08 ENCOUNTER — Ambulatory Visit (INDEPENDENT_AMBULATORY_CARE_PROVIDER_SITE_OTHER): Payer: No Typology Code available for payment source | Admitting: Internal Medicine

## 2021-10-08 DIAGNOSIS — E1159 Type 2 diabetes mellitus with other circulatory complications: Secondary | ICD-10-CM | POA: Diagnosis not present

## 2021-10-08 DIAGNOSIS — E669 Obesity, unspecified: Secondary | ICD-10-CM

## 2021-10-08 DIAGNOSIS — I152 Hypertension secondary to endocrine disorders: Secondary | ICD-10-CM

## 2021-10-08 DIAGNOSIS — Z0000 Encounter for general adult medical examination without abnormal findings: Secondary | ICD-10-CM | POA: Diagnosis not present

## 2021-10-08 DIAGNOSIS — E1169 Type 2 diabetes mellitus with other specified complication: Secondary | ICD-10-CM | POA: Diagnosis not present

## 2021-10-08 NOTE — Patient Instructions (Signed)

## 2021-10-08 NOTE — Progress Notes (Signed)
Mackenzie Clark,acting as a Education administrator for Mackenzie Greenland, MD.,have documented all relevant documentation on the behalf of Mackenzie Greenland, MD,as directed by  Mackenzie Greenland, MD while in the presence of Mackenzie Greenland, MD.  This visit occurred during the SARS-CoV-2 public health emergency.  Safety protocols were in place, including screening questions prior to the visit, additional usage of staff PPE, and extensive cleaning of exam room while observing appropriate contact time as indicated for disinfecting solutions.  Subjective:     Patient ID: Mackenzie Clark , female    DOB: September 10, 1965 , 56 y.o.   MRN: [id number]   Chief Complaint  Patient presents with   Annual Exam    HPI  Patient is here for physical exam. She is followed by Dr. Darron Clark at [facility]. She reports compliance with medication. She has no concerns at this time.   Diabetes She presents for her follow-up diabetic visit. She has type 2 diabetes mellitus. Her disease course has been stable. There are no hypoglycemic associated symptoms. Pertinent negatives for diabetes include no blurred vision. There are no hypoglycemic complications. Risk factors for coronary artery disease include diabetes mellitus, hypertension and obesity. She is compliant with treatment most of the time. She participates in exercise intermittently. An ACE inhibitor/angiotensin II receptor blocker is being taken. Eye exam is current.  Hypertension This is a chronic problem. The current episode started more than 1 year ago. The problem has been gradually improving since onset. The problem is controlled. Pertinent negatives include no blurred vision.     Past Medical History:  Diagnosis Date   Allergy    Diabetes mellitus without complication ([place])    diet and exercise controlled   Hypertension      Family History  Problem Relation Age of Onset   Hypertension Mother    Hyperlipidemia Mother    Hypertension Father    Diabetes Father     Heart disease Father        heart attack age 17   Hypertension Brother    Cancer Maternal Grandmother 79       Breast   Obesity Other    Colon cancer Neg Hx    Colon polyps Neg Hx    Esophageal cancer Neg Hx    Rectal cancer Neg Hx    Stomach cancer Neg Hx      Current Outpatient Medications:    amLODipine (NORVASC) 5 MG tablet, Take 1 tablet (5 mg total) by mouth daily., Disp: 90 tablet, Rfl: 1   fluticasone (FLONASE) 50 MCG/ACT nasal spray, Place 2 sprays into both nostrils daily., Disp: 54 g, Rfl: 2   glucose blood test strip, 1 each by Other route 2 (two) times daily. Use as instructed, Disp: , Rfl:    olmesartan-hydrochlorothiazide (BENICAR HCT) 40-25 MG tablet, TAKE 1 TABLET BY MOUTH ONCE DAILY, Disp: 90 tablet, Rfl: 1   Semaglutide-Weight Management (WEGOVY) 1.7 MG/0.75ML SOAJ, Inject 1.7 mg into the skin once a week., Disp: 3 mL, Rfl: 0   Semaglutide-Weight Management (WEGOVY) 2.4 MG/0.75ML SOAJ, Inject 2.4 mg into the skin as directed., Disp: 3 mL, Rfl: 1   No Known Allergies    The patient states she uses none for birth control. Last LMP was No LMP recorded.. Negative for Dysmenorrhea. Negative for: breast discharge, breast lump(s), breast pain and breast self exam. Associated symptoms include abnormal vaginal bleeding. Pertinent negatives include abnormal bleeding (hematology), anxiety, decreased libido, depression, difficulty falling sleep, dyspareunia, history of  infertility, nocturia, sexual dysfunction, sleep disturbances, urinary incontinence, urinary urgency, vaginal discharge and vaginal itching. Diet regular.The patient states her exercise level is  intermittent.  . The patient's tobacco use is:  Social History   Tobacco Use  Smoking Status Never  Smokeless Tobacco Never  . She has been exposed to passive smoke. The patient's alcohol use is:  Social History   Substance and Sexual Activity  Alcohol Use Yes   Comment: occasionally   Review of Systems   Constitutional: Negative.   HENT: Negative.    Eyes: Negative.  Negative for blurred vision.  Respiratory: Negative.    Cardiovascular: Negative.   Gastrointestinal: Negative.   Endocrine: Negative.   Genitourinary: Negative.   Musculoskeletal: Negative.   Skin: Negative.   Allergic/Immunologic: Negative.   Neurological: Negative.   Hematological: Negative.   Psychiatric/Behavioral: Negative.       There were no vitals filed for this visit. There is no height or weight on file to calculate BMI.   BP Readings from Last 3 Encounters:  07/13/21 (!) 162/98  01/22/21 (!) 144/90  11/11/20 128/72   Wt Readings from Last 3 Encounters:  07/13/21 198 lb 9.6 oz (90.1 kg)  01/22/21 187 lb (84.8 kg)  11/11/20 187 lb (84.8 kg)     Objective:  Physical Exam Vitals and nursing note reviewed.  Constitutional:      Appearance: Normal appearance.  HENT:     Head: Normocephalic and atraumatic.     Right Ear: Tympanic membrane, ear canal and external ear normal.     Left Ear: Tympanic membrane, ear canal and external ear normal.     Nose: Nose normal.     Mouth/Throat:     Mouth: Mucous membranes are moist.     Pharynx: Oropharynx is clear.  Eyes:     Extraocular Movements: Extraocular movements intact.     Conjunctiva/sclera: Conjunctivae normal.     Pupils: Pupils are equal, round, and reactive to light.  Cardiovascular:     Rate and Rhythm: Normal rate and regular rhythm.     Pulses: Normal pulses.          Dorsalis pedis pulses are 2+ on the right side and 2+ on the left side.     Heart sounds: Normal heart sounds.  Pulmonary:     Effort: Pulmonary effort is normal.     Breath sounds: Normal breath sounds.  Abdominal:     General: Abdomen is flat. Bowel sounds are normal.     Palpations: Abdomen is soft.  Genitourinary:    Comments: deferred Musculoskeletal:        General: Normal range of motion.     Cervical back: Normal range of motion and neck supple.  Feet:      Right foot:     Protective Sensation: 5 sites tested.  5 sites sensed.     Skin integrity: Skin integrity normal.     Toenail Condition: Right toenails are normal.     Left foot:     Protective Sensation: 5 sites tested.  5 sites sensed.     Skin integrity: Skin integrity normal.     Toenail Condition: Left toenails are normal.  Skin:    General: Skin is warm and dry.  Neurological:     General: No focal deficit present.     Mental Status: She is alert and oriented to person, place, and time.  Psychiatric:        Mood and Affect: Mood normal.  Behavior: Behavior normal.      Assessment And Plan:     1. Encounter for general adult medical examination w/o abnormal findings Comments: A full exam was performed. Importance of monthly self breast exams was discussed with the patient. PATIENT IS ADVISED TO GET 30-45 MINUTES REGULAR EXERCISE NO LESS THAN FOUR TO FIVE DAYS PER WEEK - BOTH WEIGHTBEARING EXERCISES AND AEROBIC ARE RECOMMENDED.  PATIENT IS ADVISED TO FOLLOW A HEALTHY DIET WITH AT LEAST SIX FRUITS/VEGGIES PER DAY, DECREASE INTAKE OF RED MEAT, AND TO INCREASE FISH INTAKE TO TWO DAYS PER WEEK.  MEATS/FISH SHOULD NOT BE FRIED, BAKED OR BROILED IS PREFERABLE.  IT IS ALSO IMPORTANT TO CUT BACK ON YOUR SUGAR INTAKE. PLEASE AVOID ANYTHING WITH ADDED SUGAR, CORN SYRUP OR OTHER SWEETENERS. IF YOU MUST USE A SWEETENER, YOU CAN TRY STEVIA. IT IS ALSO IMPORTANT TO AVOID ARTIFICIALLY SWEETENERS AND DIET BEVERAGES. LASTLY, I SUGGEST WEARING SPF 50 SUNSCREEN ON EXPOSED PARTS AND ESPECIALLY WHEN IN THE DIRECT SUNLIGHT FOR AN EXTENDED PERIOD OF TIME.  PLEASE AVOID FAST FOOD RESTAURANTS AND INCREASE YOUR WATER INTAKE.   2. Obesity, diabetes, and hypertension syndrome ([place]) Comments: Diabetic foot exam was performed. She will f/u in 20month for re-evaluation. BP had improved, unfortunately this was not documented. I will not change meds today. She is encouraged to follow low sodium diet and to aim for  at least 150 minutes of exercise per week. I DISCUSSED WITH THE PATIENT AT LENGTH REGARDING THE GOALS OF GLYCEMIC CONTROL AND POSSIBLE LONG-TERM COMPLICATIONS.  I  ALSO STRESSED THE IMPORTANCE OF COMPLIANCE WITH HOME GLUCOSE MONITORING, DIETARY RESTRICTIONS INCLUDING AVOIDANCE OF SUGARY DRINKS/PROCESSED FOODS,  ALONG WITH REGULAR EXERCISE.  I  ALSO STRESSED THE IMPORTANCE OF ANNUAL EYE EXAMS, SELF FOOT CARE AND COMPLIANCE WITH OFFICE VISITS.  Patient was given opportunity to ask questions. Patient verbalized understanding of the plan and was able to repeat key elements of the plan. All questions were answered to their satisfaction.   I, RMaximino Greenland MD, have reviewed all documentation for this visit. The documentation on 10/25/21 for the exam, diagnosis, procedures, and orders are all accurate and complete.   THE PATIENT IS ENCOURAGED TO PRACTICE SOCIAL DISTANCING DUE TO THE COVID-19 PANDEMIC.

## 2021-10-22 ENCOUNTER — Other Ambulatory Visit (HOSPITAL_COMMUNITY): Payer: Self-pay

## 2021-10-22 ENCOUNTER — Other Ambulatory Visit: Payer: Self-pay

## 2021-10-22 ENCOUNTER — Encounter: Payer: Self-pay | Admitting: Internal Medicine

## 2021-10-22 MED ORDER — WEGOVY 2.4 MG/0.75ML ~~LOC~~ SOAJ
2.4000 mg | SUBCUTANEOUS | 1 refills | Status: DC
  Filled 2021-10-22: qty 3, 28d supply, fill #0
  Filled 2021-11-20: qty 3, 28d supply, fill #1

## 2021-10-25 ENCOUNTER — Encounter: Payer: Self-pay | Admitting: Internal Medicine

## 2021-11-20 ENCOUNTER — Other Ambulatory Visit (HOSPITAL_COMMUNITY): Payer: Self-pay

## 2021-11-20 ENCOUNTER — Other Ambulatory Visit: Payer: Self-pay | Admitting: Internal Medicine

## 2021-11-20 DIAGNOSIS — I10 Essential (primary) hypertension: Secondary | ICD-10-CM

## 2021-11-23 ENCOUNTER — Other Ambulatory Visit (HOSPITAL_COMMUNITY): Payer: Self-pay

## 2021-11-23 MED ORDER — OLMESARTAN MEDOXOMIL-HCTZ 40-25 MG PO TABS
1.0000 | ORAL_TABLET | Freq: Every day | ORAL | 1 refills | Status: DC
  Filled 2021-11-23: qty 90, 90d supply, fill #0
  Filled 2022-02-19: qty 90, 90d supply, fill #1

## 2022-01-13 ENCOUNTER — Other Ambulatory Visit: Payer: Self-pay | Admitting: Internal Medicine

## 2022-01-13 ENCOUNTER — Other Ambulatory Visit (HOSPITAL_COMMUNITY): Payer: Self-pay

## 2022-01-13 MED ORDER — WEGOVY 2.4 MG/0.75ML ~~LOC~~ SOAJ
2.4000 mg | SUBCUTANEOUS | 1 refills | Status: DC
  Filled 2022-01-13: qty 3, 28d supply, fill #0

## 2022-01-15 ENCOUNTER — Other Ambulatory Visit (HOSPITAL_COMMUNITY): Payer: Self-pay

## 2022-01-19 ENCOUNTER — Encounter: Payer: Self-pay | Admitting: Internal Medicine

## 2022-02-01 ENCOUNTER — Ambulatory Visit (INDEPENDENT_AMBULATORY_CARE_PROVIDER_SITE_OTHER): Payer: No Typology Code available for payment source | Admitting: Internal Medicine

## 2022-02-01 ENCOUNTER — Other Ambulatory Visit (HOSPITAL_COMMUNITY): Payer: Self-pay

## 2022-02-01 ENCOUNTER — Encounter: Payer: Self-pay | Admitting: Internal Medicine

## 2022-02-01 VITALS — BP 140/74 | HR 98 | Temp 98.7°F | Ht 67.0 in | Wt 199.6 lb

## 2022-02-01 DIAGNOSIS — Z6831 Body mass index (BMI) 31.0-31.9, adult: Secondary | ICD-10-CM

## 2022-02-01 DIAGNOSIS — E6609 Other obesity due to excess calories: Secondary | ICD-10-CM

## 2022-02-01 DIAGNOSIS — E1169 Type 2 diabetes mellitus with other specified complication: Secondary | ICD-10-CM | POA: Diagnosis not present

## 2022-02-01 DIAGNOSIS — E7800 Pure hypercholesterolemia, unspecified: Secondary | ICD-10-CM | POA: Diagnosis not present

## 2022-02-01 DIAGNOSIS — I152 Hypertension secondary to endocrine disorders: Secondary | ICD-10-CM

## 2022-02-01 DIAGNOSIS — E1159 Type 2 diabetes mellitus with other circulatory complications: Secondary | ICD-10-CM

## 2022-02-01 MED ORDER — WEGOVY 2.4 MG/0.75ML ~~LOC~~ SOAJ
2.4000 mg | SUBCUTANEOUS | 1 refills | Status: DC
  Filled 2022-02-01: qty 3, 28d supply, fill #0
  Filled 2022-03-07: qty 3, 28d supply, fill #1

## 2022-02-01 MED ORDER — AMLODIPINE BESYLATE 10 MG PO TABS
10.0000 mg | ORAL_TABLET | Freq: Every day | ORAL | 2 refills | Status: DC
  Filled 2022-02-01: qty 90, 90d supply, fill #0
  Filled 2022-05-31 (×2): qty 90, 90d supply, fill #1
  Filled 2022-11-23: qty 90, 90d supply, fill #2

## 2022-02-01 NOTE — Progress Notes (Signed)
Mackenzie Clark,acting as a Education administrator for Mackenzie Greenland, Mackenzie Clark.,have documented all relevant documentation on the behalf of Mackenzie Greenland, Mackenzie Clark,as directed by  Mackenzie Greenland, Mackenzie Clark while in the presence of Mackenzie Greenland, Mackenzie Clark.    Subjective:     Patient ID: Mackenzie Clark , female    DOB: 1965/09/19 , 56 y.o.   MRN: [id number]   No chief complaint on file.   HPI  She presents today for BP and DM f/u. She reports compliance with meds. She denies headaches, chest pain and shortness of breath.   Hypertension This is a chronic problem. The current episode started more than 1 year ago. The problem has been gradually improving since onset. The problem is controlled. Pertinent negatives include no blurred vision.  Diabetes She presents for her follow-up diabetic visit. She has type 2 diabetes mellitus. Her disease course has been stable. There are no hypoglycemic associated symptoms. Pertinent negatives for diabetes include no blurred vision. There are no hypoglycemic complications. Risk factors for coronary artery disease include diabetes mellitus, hypertension and obesity. She is compliant with treatment most of the time. She participates in exercise intermittently. An ACE inhibitor/angiotensin II receptor blocker is being taken. Eye exam is current.     Past Medical History:  Diagnosis Date   Allergy    Diabetes mellitus without complication ([place])    diet and exercise controlled   Hypertension      Family History  Problem Relation Age of Onset   Hypertension Mother    Hyperlipidemia Mother    Hypertension Father    Diabetes Father    Heart disease Father        heart attack age 3   Hypertension Brother    Cancer Maternal Grandmother 79       Breast   Obesity Other    Colon cancer Neg Hx    Colon polyps Neg Hx    Esophageal cancer Neg Hx    Rectal cancer Neg Hx    Stomach cancer Neg Hx      Current Outpatient Medications:    amLODipine (NORVASC) 5 MG tablet, Take 1  tablet (5 mg total) by mouth daily., Disp: 90 tablet, Rfl: 1   fluticasone (FLONASE) 50 MCG/ACT nasal spray, Place 2 sprays into both nostrils daily., Disp: 54 g, Rfl: 2   glucose blood test strip, 1 each by Other route 2 (two) times daily. Use as instructed, Disp: , Rfl:    olmesartan-hydrochlorothiazide (BENICAR HCT) 40-25 MG tablet, TAKE 1 TABLET BY MOUTH ONCE DAILY, Disp: 90 tablet, Rfl: 1   Semaglutide-Weight Management (WEGOVY) 1.7 MG/0.75ML SOAJ, Inject 1.7 mg into the skin once a week., Disp: 3 mL, Rfl: 0   Semaglutide-Weight Management (WEGOVY) 2.4 MG/0.75ML SOAJ, Inject 2.4 mg into the skin as directed., Disp: 3 mL, Rfl: 1   No Known Allergies   Review of Systems  Constitutional: Negative.   Eyes: Negative.  Negative for blurred vision.  Respiratory: Negative.    Musculoskeletal: Negative.   Skin: Negative.   Neurological: Negative.   Psychiatric/Behavioral: Negative.       There were no vitals filed for this visit. There is no height or weight on file to calculate BMI.   Objective:  Physical Exam      Assessment And Plan:     1. Obesity, diabetes, and hypertension syndrome ([place])     Patient was given opportunity to ask questions. Patient verbalized understanding of the plan and was able to repeat  key elements of the plan. All questions were answered to their satisfaction.  Mackenzie Clark, CMA   I, [place], CMA, have reviewed all documentation for this visit. The documentation on 02/01/22 for the exam, diagnosis, procedures, and orders are all accurate and complete.   IF YOU HAVE BEEN REFERRED TO A SPECIALIST, IT MAY TAKE 1-2 WEEKS TO SCHEDULE/PROCESS THE REFERRAL. IF YOU HAVE NOT HEARD FROM US/SPECIALIST IN TWO WEEKS, PLEASE GIVE [place] A CALL AT [phone].   THE PATIENT IS ENCOURAGED TO PRACTICE SOCIAL DISTANCING DUE TO THE COVID-19 PANDEMIC.

## 2022-02-01 NOTE — Progress Notes (Signed)
Barnet Glasgow Martin,acting as a Education administrator for [name], MD.,have documented all relevant documentation on the behalf of [name], MD,as directed by  [name], MD while in the presence of [name], MD.    Subjective:     Patient ID: Mackenzie Clark , female    DOB: [birth year] , 56 y.o.   MRN: [id number]   Chief Complaint  Patient presents with   Diabetes   Hypertension    HPI  She presents today for BP and DM f/u. She reports compliance with meds. She denies headaches, chest pain and shortness of breath. Patient states she is still waiting on a PA for her [place].   BP Readings from Last 3 Encounters: 02/01/22 : 136/74 07/13/21 : (!) 162/98 01/22/21 : (!) 144/90     Hypertension This is a chronic problem. The current episode started more than 1 year ago. The problem has been gradually improving since onset. The problem is controlled. Pertinent negatives include no blurred vision or chest pain.  Diabetes She presents for her follow-up diabetic visit. She has type 2 diabetes mellitus. Her disease course has been stable. There are no hypoglycemic associated symptoms. Pertinent negatives for diabetes include no blurred vision and no chest pain. There are no hypoglycemic complications. Risk factors for coronary artery disease include diabetes mellitus, hypertension and obesity. She is compliant with treatment most of the time. She participates in exercise intermittently. An ACE inhibitor/angiotensin II receptor blocker is being taken. Eye exam is current.     Past Medical History:  Diagnosis Date   Allergy    Diabetes mellitus without complication ([employer])    diet and exercise controlled   Hypertension      Family History  Problem Relation Age of Onset   Hypertension Mother    Hyperlipidemia Mother    Hypertension Father    Diabetes Father    Heart disease Father        heart attack age 30   Hypertension Brother    Cancer Maternal Grandmother 79        Breast   Obesity Other    Colon cancer Neg Hx    Colon polyps Neg Hx    Esophageal cancer Neg Hx    Rectal cancer Neg Hx    Stomach cancer Neg Hx      Current Outpatient Medications:    amLODipine (NORVASC) 10 MG tablet, Take 1 tablet (10 mg total) by mouth daily., Disp: 90 tablet, Rfl: 2   fluticasone (FLONASE) 50 MCG/ACT nasal spray, Place 2 sprays into both nostrils daily., Disp: 54 g, Rfl: 2   glucose blood test strip, 1 each by Other route 2 (two) times daily. Use as instructed, Disp: , Rfl:    olmesartan-hydrochlorothiazide (BENICAR HCT) 40-25 MG tablet, TAKE 1 TABLET BY MOUTH ONCE DAILY, Disp: 90 tablet, Rfl: 1   Semaglutide-Weight Management (WEGOVY) 2.4 MG/0.75ML SOAJ, Inject 2.4 mg into the skin as directed., Disp: 3 mL, Rfl: 1   No Known Allergies   Review of Systems  Constitutional: Negative.   HENT: Negative.    Eyes: Negative.  Negative for blurred vision.  Respiratory: Negative.    Cardiovascular: Negative.  Negative for chest pain.  Gastrointestinal: Negative.      Today's Vitals   02/01/22 0833 02/01/22 0900  BP: 136/74 (!) 140/74  Pulse: 98   Temp: 98.7 F (37.1 C)   TempSrc: Oral   Weight: 199 lb 9.6 oz (90.5 kg)   Height: 5'  7" (1.702 m)   PainSc: 0-No pain    Body mass index is 31.26 kg/m.  Wt Readings from Last 3 Encounters:  02/01/22 199 lb 9.6 oz (90.5 kg)  07/13/21 198 lb 9.6 oz (90.1 kg)  01/22/21 187 lb (84.8 kg)    Objective:  Physical Exam Vitals and nursing note reviewed.  Constitutional:      Appearance: Normal appearance.  HENT:     Head: Normocephalic and atraumatic.     Nose:     Comments: Masked     Mouth/Throat:     Comments: Masked  Eyes:     Extraocular Movements: Extraocular movements intact.  Cardiovascular:     Rate and Rhythm: Normal rate and regular rhythm.     Heart sounds: Normal heart sounds.  Pulmonary:     Effort: Pulmonary effort is normal.     Breath sounds: Normal breath sounds.  Musculoskeletal:      Cervical back: Normal range of motion.  Skin:    General: Skin is warm.  Neurological:     General: No focal deficit present.     Mental Status: She is alert.  Psychiatric:        Mood and Affect: Mood normal.        Behavior: Behavior normal.       Assessment And Plan:     1. Obesity, diabetes, and hypertension syndrome ([place]) Comments: BP not yet at goal, I will increase amlodipine to 30m nightly. She agrees to check her BP at work. She will send me readings in two weeks.  - Hemoglobin A1c - CMP14+EGFR  2. Pure hypercholesterolemia Comments: LDL 108 in April 2023.  She does not wish to start statin therapy despite risks associated with untreated hyperlipidemia.   3. Class 1 obesity due to excess calories with serious comorbidity and body mass index (BMI) of 31.0 to 31.9 in adult Comments: She is encouraged to exercise at least 150 minutes per week.     Patient was given opportunity to ask questions. Patient verbalized understanding of the plan and was able to repeat key elements of the plan. All questions were answered to their satisfaction.   I, RMaximino Greenland MD, have reviewed all documentation for this visit. The documentation on 02/01/22 for the exam, diagnosis, procedures, and orders are all accurate and complete.   IF YOU HAVE BEEN REFERRED TO A SPECIALIST, IT MAY TAKE 1-2 WEEKS TO SCHEDULE/PROCESS THE REFERRAL. IF YOU HAVE NOT HEARD FROM US/SPECIALIST IN TWO WEEKS, PLEASE GIVE [facility] CALL AT [phone].   THE PATIENT IS ENCOURAGED TO PRACTICE SOCIAL DISTANCING DUE TO THE COVID-19 PANDEMIC.

## 2022-02-01 NOTE — Patient Instructions (Signed)
Hypertension, Adult ?Hypertension is another name for high blood pressure. High blood pressure forces your heart to work harder to pump blood. This can cause problems over time. ?There are two numbers in a blood pressure reading. There is a top number (systolic) over a bottom number (diastolic). It is best to have a blood pressure that is below 120/80. ?What are the causes? ?The cause of this condition is not known. Some other conditions can lead to high blood pressure. ?What increases the risk? ?Some lifestyle factors can make you more likely to develop high blood pressure: ?Smoking. ?Not getting enough exercise or physical activity. ?Being overweight. ?Having too much fat, sugar, calories, or salt (sodium) in your diet. ?Drinking too much alcohol. ?Other risk factors include: ?Having any of these conditions: ?Heart disease. ?Diabetes. ?High cholesterol. ?Kidney disease. ?Obstructive sleep apnea. ?Having a family history of high blood pressure and high cholesterol. ?Age. The risk increases with age. ?Stress. ?What are the signs or symptoms? ?High blood pressure may not cause symptoms. Very high blood pressure (hypertensive crisis) may cause: ?Headache. ?Fast or uneven heartbeats (palpitations). ?Shortness of breath. ?Nosebleed. ?Vomiting or feeling like you may vomit (nauseous). ?Changes in how you see. ?Very bad chest pain. ?Feeling dizzy. ?Seizures. ?How is this treated? ?This condition is treated by making healthy lifestyle changes, such as: ?Eating healthy foods. ?Exercising more. ?Drinking less alcohol. ?Your doctor may prescribe medicine if lifestyle changes do not help enough and if: ?Your top number is above 130. ?Your bottom number is above 80. ?Your personal target blood pressure may vary. ?Follow these instructions at home: ?Eating and drinking ? ?If told, follow the DASH eating plan. To follow this plan: ?Fill one half of your plate at each meal with fruits and vegetables. ?Fill one fourth of your plate  at each meal with whole grains. Whole grains include whole-wheat pasta, brown rice, and whole-grain bread. ?Eat or drink low-fat dairy products, such as skim milk or low-fat yogurt. ?Fill one fourth of your plate at each meal with low-fat (lean) proteins. Low-fat proteins include fish, chicken without skin, eggs, beans, and tofu. ?Avoid fatty meat, cured and processed meat, or chicken with skin. ?Avoid pre-made or processed food. ?Limit the amount of salt in your diet to less than 1,500 mg each day. ?Do not drink alcohol if: ?Your doctor tells you not to drink. ?You are pregnant, may be pregnant, or are planning to become pregnant. ?If you drink alcohol: ?Limit how much you have to: ?0-1 drink a day for women. ?0-2 drinks a day for men. ?Know how much alcohol is in your drink. In the [place] one drink equals one 12 oz bottle of beer (355 mL), one 5 oz glass of wine (148 mL), or one 1? oz glass of hard liquor (44 mL). ?Lifestyle ? ?Work with your doctor to stay at a healthy weight or to lose weight. Ask your doctor what the best weight is for you. ?Get at least 30 minutes of exercise that causes your heart to beat faster (aerobic exercise) most days of the week. This may include walking, swimming, or biking. ?Get at least 30 minutes of exercise that strengthens your muscles (resistance exercise) at least 3 days a week. This may include lifting weights or doing Pilates. ?Do not smoke or use any products that contain nicotine or tobacco. If you need help quitting, ask your doctor. ?Check your blood pressure at home as told by your doctor. ?Keep all follow-up visits. ?Medicines ?Take over-the-counter and prescription medicines   only as told by your doctor. Follow directions carefully. ?Do not skip doses of blood pressure medicine. The medicine does not work as well if you skip doses. Skipping doses also puts you at risk for problems. ?Ask your doctor about side effects or reactions to medicines that you should watch  for. ?Contact a doctor if: ?You think you are having a reaction to the medicine you are taking. ?You have headaches that keep coming back. ?You feel dizzy. ?You have swelling in your ankles. ?You have trouble with your vision. ?Get help right away if: ?You get a very bad headache. ?You start to feel mixed up (confused). ?You feel weak or numb. ?You feel faint. ?You have very bad pain in your: ?Chest. ?Belly (abdomen). ?You vomit more than once. ?You have trouble breathing. ?These symptoms may be an emergency. Get help right away. Call 911. ?Do not wait to see if the symptoms will go away. ?Do not drive yourself to the hospital. ?Summary ?Hypertension is another name for high blood pressure. ?High blood pressure forces your heart to work harder to pump blood. ?For most people, a normal blood pressure is less than 120/80. ?Making healthy choices can help lower blood pressure. If your blood pressure does not get lower with healthy choices, you may need to take medicine. ?This information is not intended to replace advice given to you by your health care provider. Make sure you discuss any questions you have with your health care provider. ?Document Revised: 01/08/2021 Document Reviewed: 01/08/2021 ?Elsevier Patient Education ? 2023 Else[employer] Inc[employer] ? ?

## 2022-02-02 ENCOUNTER — Other Ambulatory Visit (HOSPITAL_COMMUNITY): Payer: Self-pay

## 2022-02-02 LAB — CMP14+EGFR
ALT: 17 IU/L (ref 0–32)
AST: 20 IU/L (ref 0–40)
Albumin/Globulin Ratio: 1.7 (ref 1.2–2.2)
Albumin: 4.5 g/dL (ref 3.8–4.9)
Alkaline Phosphatase: 89 IU/L (ref 44–121)
BUN/Creatinine Ratio: 20 (ref 9–23)
BUN: 17 mg/dL (ref 6–24)
Bilirubin Total: 0.5 mg/dL (ref 0.0–1.2)
CO2: 24 mmol/L (ref 20–29)
Calcium: 10 mg/dL (ref 8.7–10.2)
Chloride: 100 mmol/L (ref 96–106)
Creatinine, Ser: 0.85 mg/dL (ref 0.57–1.00)
Globulin, Total: 2.7 g/dL (ref 1.5–4.5)
Glucose: 97 mg/dL (ref 70–99)
Potassium: 3.3 mmol/L — ABNORMAL LOW (ref 3.5–5.2)
Sodium: 142 mmol/L (ref 134–144)
Total Protein: 7.2 g/dL (ref 6.0–8.5)
eGFR: 82 mL/min/{1.73_m2} (ref >59)

## 2022-02-02 LAB — HEMOGLOBIN A1C
Est. average glucose Bld gHb Est-mCnc: 120 mg/dL
Hgb A1c MFr Bld: 5.8 % — ABNORMAL HIGH (ref 4.8–5.6)

## 2022-02-17 ENCOUNTER — Other Ambulatory Visit (HOSPITAL_COMMUNITY): Payer: Self-pay

## 2022-02-17 ENCOUNTER — Telehealth (INDEPENDENT_AMBULATORY_CARE_PROVIDER_SITE_OTHER): Payer: No Typology Code available for payment source | Admitting: Internal Medicine

## 2022-02-17 ENCOUNTER — Encounter: Payer: Self-pay | Admitting: Internal Medicine

## 2022-02-17 DIAGNOSIS — U071 COVID-19: Secondary | ICD-10-CM

## 2022-02-17 MED ORDER — HYDROCODONE BIT-HOMATROP MBR 5-1.5 MG/5ML PO SOLN
5.0000 mL | Freq: Four times a day (QID) | ORAL | 0 refills | Status: DC | PRN
  Filled 2022-02-17: qty 120, 6d supply, fill #0

## 2022-02-17 MED ORDER — NIRMATRELVIR/RITONAVIR (PAXLOVID)TABLET
3.0000 | ORAL_TABLET | Freq: Two times a day (BID) | ORAL | 0 refills | Status: AC
  Filled 2022-02-17: qty 30, 5d supply, fill #0

## 2022-02-17 NOTE — Patient Instructions (Signed)

## 2022-02-17 NOTE — Progress Notes (Signed)
Virtual Visit via Video   This visit type was conducted due to national recommendations for restrictions regarding the COVID-19 Pandemic (e.g. social distancing) in an effort to limit this patient's exposure and mitigate transmission in our community.  Due to her co-morbid illnesses, this patient is at least at moderate risk for complications without adequate follow up.  This format is felt to be most appropriate for this patient at this time.  All issues noted in this document were discussed and addressed.  A limited physical exam was performed with this format.    This visit type was conducted due to national recommendations for restrictions regarding the COVID-19 Pandemic (e.g. social distancing) in an effort to limit this patient's exposure and mitigate transmission in our community.  Patients identity confirmed using two different identifiers.  This format is felt to be most appropriate for this patient at this time.  All issues noted in this document were discussed and addressed.  No physical exam was performed (except for noted visual exam findings with Video Visits).    Date:  02/17/2022   ID:  Pamalee Leyden, DOB [birth year], MRN [id number]  Patient Location:  Home  Provider location:   Office  Chief Complaint:  "I have COVID"  History of Present Illness:    Meron Bocchino is a 56 y.o. female who presents via video conferencing for a telehealth visit today.    The patient does have symptoms concerning for COVID-19 infection (fever, chills, cough, or new shortness of breath).   She presents today for virtual visit. She prefers this method of contact due to COVID-19 pandemic.    Patient presents today for COVID. She states she started coughing on Monday. Yesterday evening, she felt terrible.  Her sx include body aches, chills, headaches and a runny nose. She took a home test earlier today which was positive.  She did the test at work, so she went home after seeing positive  result. This is her first time having COVID. She has taken Mucinex without much relief. She does not think she has a fever.       Past Medical History:  Diagnosis Date   Allergy    Diabetes mellitus without complication ([facility])    diet and exercise controlled   Hypertension    Past Surgical History:  Procedure Laterality Date   DILATION AND CURETTAGE OF UTERUS  2006   16 week stillbirth   IR ANGIOGRAM PELVIS SELECTIVE OR SUPRASELECTIVE  08/31/2019   IR ANGIOGRAM SELECTIVE EACH ADDITIONAL VESSEL  08/31/2019   IR EMBO TUMOR ORGAN ISCHEMIA INFARCT INC GUIDE ROADMAPPING  08/31/2019   IR FLUORO GUIDED NEEDLE PLC ASPIRATION/INJECTION LOC  08/31/2019   IR RADIOLOGIST EVAL & MGMT  07/10/2019   IR RADIOLOGIST EVAL & MGMT  09/18/2019   IR US GUIDE VASC ACCESS RIGHT  08/31/2019     Current Meds  Medication Sig   amLODipine (NORVASC) 10 MG tablet Take 1 tablet (10 mg total) by mouth daily.   fluticasone (FLONASE) 50 MCG/ACT nasal spray Place 2 sprays into both nostrils daily.   glucose blood test strip 1 each by Other route 2 (two) times daily. Use as instructed   HYDROcodone bit-homatropine (HYDROMET) 5-1.5 MG/5ML syrup Take 5 mLs by mouth every 6 (six) hours as needed.   nirmatrelvir/ritonavir EUA (PAXLOVID) 20 x 150 MG & 10 x '100MG'$  TABS Take 3 tablets by mouth 2 (two) times daily for 5 days. Patient GFR is 82.   olmesartan-hydrochlorothiazide (BENICAR HCT) 40-25  MG tablet TAKE 1 TABLET BY MOUTH ONCE DAILY   Semaglutide-Weight Management (WEGOVY) 2.4 MG/0.75ML SOAJ Inject 2.4 mg into the skin once a week.     Allergies:   Patient has no known allergies.   Social History   Tobacco Use   Smoking status: Never   Smokeless tobacco: Never  Vaping Use   Vaping Use: Never used  Substance Use Topics   Alcohol use: Yes    Comment: occasionally   Drug use: No     Family Hx: The patient's family history includes Cancer (age of onset: 39) in her maternal grandmother; Diabetes in her father; Heart  disease in her father; Hyperlipidemia in her mother; Hypertension in her brother, father, and mother; Obesity in an other family member. There is no history of Colon cancer, Colon polyps, Esophageal cancer, Rectal cancer, or Stomach cancer.  ROS:   Please see the history of present illness.    Review of Systems  Constitutional:  Positive for chills.  HENT:  Positive for congestion.   Respiratory:  Positive for cough.   Gastrointestinal: Negative.   Musculoskeletal: Negative.   Skin: Negative.   Endo/Heme/Allergies: Negative.     All other systems reviewed and are negative.   Labs/Other Tests and Data Reviewed:    Recent Labs: 07/13/2021: Hemoglobin 12.7; Platelets 224 02/01/2022: ALT 17; BUN 17; Creatinine, Ser 0.85; Potassium 3.3; Sodium 142   Recent Lipid Panel Lab Results  Component Value Date/Time   CHOL 211 (H) 07/13/2021 02:27 PM   TRIG 55 07/13/2021 02:27 PM   HDL 93 07/13/2021 02:27 PM   CHOLHDL 2.3 07/13/2021 02:27 PM   CHOLHDL 2.7 03/05/2013 02:28 PM   LDLCALC 108 (H) 07/13/2021 02:27 PM    Wt Readings from Last 3 Encounters:  02/01/22 199 lb 9.6 oz (90.5 kg)  07/13/21 198 lb 9.6 oz (90.1 kg)  01/22/21 187 lb (84.8 kg)     Exam:    Vital Signs:  There were no vitals taken for this visit.    Physical Exam Vitals and nursing note reviewed.  Constitutional:      Appearance: She is ill-appearing.  HENT:     Head: Normocephalic and atraumatic.  Eyes:     Extraocular Movements: Extraocular movements intact.  Pulmonary:     Effort: Pulmonary effort is normal.     Comments: Able to speak in full sentences Musculoskeletal:     Cervical back: Normal range of motion.  Neurological:     Mental Status: She is alert and oriented to person, place, and time.  Psychiatric:        Mood and Affect: Affect normal.     ASSESSMENT & PLAN:    1. COVID - Temperature monitoring; Future  I will refer her for home monitoring/temperature monitoring program. She is  encouraged to email me daily on mychart to let me know how she is doing. She is encouraged to go to ER should she develop worsening SOB. Pt advised that she will be out of work for five days, she has already contacted [facility]. I will send rx Paxlovid, she did want antiviral treatment. She is also advised to avoid dairy x 2 weeks, stay well hydrated and drink at least one hot beverages daily. She is also advised to take Vitamin C, melatonin nightly, and an antihistamine like Zyrtec or loratadine daily.  I will send rx hydromet syrup to use prn.  She verbalizes understanding of her treatment plan. All questions were answered to her satisfaction.  She understands that she needs to continue to self quarantine and she agrees to wear a mask for five days after returning to work.     COVID-19 Education: The signs and symptoms of COVID-19 were discussed with the patient and how to seek care for testing (follow up with PCP or arrange E-visit).  The importance of social distancing was discussed today.  Patient Risk:   After full review of this patients clinical status, I feel that they are at least moderate risk at this time.  Time:   Today, I have spent 12 minutes/ 37 seconds with the patient with telehealth technology discussing above diagnoses.     Medication Adjustments/Labs and Tests Ordered: Current medicines are reviewed at length with the patient today.  Concerns regarding medicines are outlined above.   Tests Ordered: No orders of the defined types were placed in this encounter.   Medication Changes: Meds ordered this encounter  Medications   nirmatrelvir/ritonavir EUA (PAXLOVID) 20 x 150 MG & 10 x '100MG'$  TABS    Sig: Take 3 tablets by mouth 2 (two) times daily for 5 days. Patient GFR is 82.    Dispense:  30 tablet    Refill:  0   HYDROcodone bit-homatropine (HYDROMET) 5-1.5 MG/5ML syrup    Sig: Take 5 mLs by mouth every 6 (six) hours as needed.    Dispense:  120 mL    Refill:  0     Disposition:  Follow up prn  Signed, [name], MD

## 2022-02-18 ENCOUNTER — Telehealth: Payer: Self-pay

## 2022-02-18 ENCOUNTER — Encounter: Payer: Self-pay | Admitting: Internal Medicine

## 2022-02-18 NOTE — Telephone Encounter (Signed)
Called, spoke with patient in regards to [place] on my chart for diarrhea symptoms. Advise patient from my chart companion. Patient verbal understanding. Care advise given from my chart companion:  If diarrhea remains the same:  encourage patient to drink oral fluids and bland foods.  Avoid alcohol, spicy foods, caffeine or fatty foods that could make diarrhea worse.  Continue to monitor for signs of dehydration (increased thirst decreased urine output, yellow urine, dry skin, headache or dizziness).  Advise patient to try OTC medication (Imodium, kaopectate, Pepto-Bismol) as per manufacturer's instructions.  If worsening diarrhea occurs and becomes severe (6-7 bowel movements a day): notify PCP  If diarrhea last greater than 7 days: notify PCP If signs of dehydration occur (increased thirst, decreased urine output, yellow urine, dry skin, headache or dizziness) advise patient to call 911 and seek treatment in the ED

## 2022-02-19 ENCOUNTER — Other Ambulatory Visit (HOSPITAL_COMMUNITY): Payer: Self-pay

## 2022-03-08 ENCOUNTER — Other Ambulatory Visit (HOSPITAL_COMMUNITY): Payer: Self-pay

## 2022-03-13 LAB — HM MAMMOGRAPHY

## 2022-04-07 ENCOUNTER — Encounter: Payer: Self-pay | Admitting: Internal Medicine

## 2022-04-26 ENCOUNTER — Other Ambulatory Visit (HOSPITAL_COMMUNITY): Payer: Self-pay

## 2022-04-26 ENCOUNTER — Other Ambulatory Visit: Payer: Self-pay

## 2022-04-26 ENCOUNTER — Other Ambulatory Visit: Payer: Self-pay | Admitting: Internal Medicine

## 2022-04-26 MED ORDER — WEGOVY 2.4 MG/0.75ML ~~LOC~~ SOAJ
2.4000 mg | SUBCUTANEOUS | 1 refills | Status: DC
  Filled 2022-04-26: qty 3, 28d supply, fill #0

## 2022-05-05 NOTE — Progress Notes (Unsigned)
I,Mackenzie Clark,acting as a scribe for Mackenzie Greenland, MD.,have documented all relevant documentation on the behalf of Mackenzie Greenland, MD,as directed by  Mackenzie Greenland, MD while in the presence of Mackenzie Greenland, MD.    Subjective:     Patient ID: Mackenzie Clark , female    DOB: 1965-07-01 , 57 y.o.   MRN: [id number]   Chief Complaint  Patient presents with   Diabetes   Hypertension    HPI  She presents today for BP and DM f/u. She reports compliance with meds. She denies headaches, chest pain and shortness of breath.       Diabetes She presents for her follow-up diabetic visit. She has type 2 diabetes mellitus. Her disease course has been stable. There are no hypoglycemic associated symptoms. Pertinent negatives for diabetes include no blurred vision and no chest pain. There are no hypoglycemic complications. Risk factors for coronary artery disease include diabetes mellitus, hypertension and obesity. She is compliant with treatment most of the time. She participates in exercise intermittently. An ACE inhibitor/angiotensin II receptor blocker is being taken. Eye exam is current.  Hypertension This is a chronic problem. The current episode started more than 1 year ago. The problem has been gradually improving since onset. The problem is controlled. Pertinent negatives include no blurred vision or chest pain.     Past Medical History:  Diagnosis Date   Allergy    Diabetes mellitus without complication ([place])    diet and exercise controlled   Hypertension      Family History  Problem Relation Age of Onset   Hypertension Mother    Hyperlipidemia Mother    Hypertension Father    Diabetes Father    Heart disease Father        heart attack age 11   Hypertension Brother    Cancer Maternal Grandmother 79       Breast   Obesity Other    Colon cancer Neg Hx    Colon polyps Neg Hx    Esophageal cancer Neg Hx    Rectal cancer Neg Hx    Stomach cancer Neg Hx       Current Outpatient Medications:    amLODipine (NORVASC) 10 MG tablet, Take 1 tablet (10 mg total) by mouth daily., Disp: 90 tablet, Rfl: 2   fluticasone (FLONASE) 50 MCG/ACT nasal spray, Place 2 sprays into both nostrils daily., Disp: 54 g, Rfl: 2   glucose blood test strip, 1 each by Other route 2 (two) times daily. Use as instructed, Disp: , Rfl:    olmesartan-hydrochlorothiazide (BENICAR HCT) 40-25 MG tablet, TAKE 1 TABLET BY MOUTH ONCE DAILY, Disp: 90 tablet, Rfl: 1   tirzepatide (ZEPBOUND) 5 MG/0.5ML Pen, Inject 5 mg into the skin once a week., Disp: 2 mL, Rfl: 0   No Known Allergies   Review of Systems  Constitutional: Negative.   Eyes:  Negative for blurred vision.  Respiratory: Negative.    Cardiovascular: Negative.  Negative for chest pain.  Musculoskeletal: Negative.   Neurological: Negative.   Psychiatric/Behavioral: Negative.       Today's Vitals   05/06/22 0829  BP: 118/86  Pulse: 84  Temp: 98 F (36.7 C)  SpO2: 98%  Weight: 199 lb (90.3 kg)  Height: '5\' 7"'$  (1.702 m)   Body mass index is 31.17 kg/m.  BP Readings from Last 3 Encounters:  05/06/22 118/86  02/01/22 (!) 140/74  07/13/21 (!) 162/98   Wt Readings from Last 3  Encounters:  05/06/22 199 lb (90.3 kg)  02/01/22 199 lb 9.6 oz (90.5 kg)  07/13/21 198 lb 9.6 oz (90.1 kg)    Objective:  Physical Exam Vitals and nursing note reviewed.  Constitutional:      Appearance: Normal appearance.  HENT:     Head: Normocephalic and atraumatic.     Nose:     Comments: Masked     Mouth/Throat:     Comments: Masked  Eyes:     Extraocular Movements: Extraocular movements intact.  Cardiovascular:     Rate and Rhythm: Normal rate and regular rhythm.     Heart sounds: Normal heart sounds.  Pulmonary:     Effort: Pulmonary effort is normal.     Breath sounds: Normal breath sounds.  Musculoskeletal:     Cervical back: Normal range of motion.  Skin:    General: Skin is warm.  Neurological:      General: No focal deficit present.     Mental Status: She is alert.  Psychiatric:        Mood and Affect: Mood normal.        Behavior: Behavior normal.       Assessment And Plan:     1. Obesity, diabetes, and hypertension syndrome ([place]) Comments: Chronic, I will check labs as below. Encouraged to resume her regular exercise regimen. - CMP14+EGFR - Hemoglobin A1c - Lipid panel  2. Pure hypercholesterolemia Comments: Chronic, she does not wish to start statin therapy. We discussed the use of cardiac calcium scoring to further eval cardiac risk. She was given info to read. - Lipid panel  3. Class 1 obesity due to excess calories without serious comorbidity with body mass index (BMI) of 31.0 to 31.9 in adult She feels like she has hit plateau with Wegovy. We will switch to Zepbound '5mg'$  weekly. I will send rx to start PA process.    Patient was given opportunity to ask questions. Patient verbalized understanding of the plan and was able to repeat key elements of the plan. All questions were answered to their satisfaction.   I, Mackenzie Greenland, MD, have reviewed all documentation for this visit. The documentation on 05/06/22 for the exam, diagnosis, procedures, and orders are all accurate and complete.   IF YOU HAVE BEEN REFERRED TO A SPECIALIST, IT MAY TAKE 1-2 WEEKS TO SCHEDULE/PROCESS THE REFERRAL. IF YOU HAVE NOT HEARD FROM US/SPECIALIST IN TWO WEEKS, PLEASE GIVE [place] A CALL AT [phone].   THE PATIENT IS ENCOURAGED TO PRACTICE SOCIAL DISTANCING DUE TO THE COVID-19 PANDEMIC.

## 2022-05-06 ENCOUNTER — Ambulatory Visit: Payer: Commercial Managed Care - PPO | Admitting: Internal Medicine

## 2022-05-06 ENCOUNTER — Other Ambulatory Visit (HOSPITAL_COMMUNITY): Payer: Self-pay

## 2022-05-06 ENCOUNTER — Other Ambulatory Visit: Payer: Self-pay

## 2022-05-06 ENCOUNTER — Encounter: Payer: Self-pay | Admitting: Internal Medicine

## 2022-05-06 VITALS — BP 118/86 | HR 84 | Temp 98.0°F | Ht 67.0 in | Wt 199.0 lb

## 2022-05-06 DIAGNOSIS — E1159 Type 2 diabetes mellitus with other circulatory complications: Secondary | ICD-10-CM | POA: Diagnosis not present

## 2022-05-06 DIAGNOSIS — Z6831 Body mass index (BMI) 31.0-31.9, adult: Secondary | ICD-10-CM | POA: Diagnosis not present

## 2022-05-06 DIAGNOSIS — I152 Hypertension secondary to endocrine disorders: Secondary | ICD-10-CM | POA: Diagnosis not present

## 2022-05-06 DIAGNOSIS — E1169 Type 2 diabetes mellitus with other specified complication: Secondary | ICD-10-CM

## 2022-05-06 DIAGNOSIS — E7800 Pure hypercholesterolemia, unspecified: Secondary | ICD-10-CM | POA: Diagnosis not present

## 2022-05-06 DIAGNOSIS — E669 Obesity, unspecified: Secondary | ICD-10-CM | POA: Diagnosis not present

## 2022-05-06 DIAGNOSIS — E6609 Other obesity due to excess calories: Secondary | ICD-10-CM | POA: Diagnosis not present

## 2022-05-06 DIAGNOSIS — F40243 Fear of flying: Secondary | ICD-10-CM

## 2022-05-06 MED ORDER — ZEPBOUND 5 MG/0.5ML ~~LOC~~ SOAJ
5.0000 mg | SUBCUTANEOUS | 0 refills | Status: DC
  Filled 2022-05-06 – 2022-05-31 (×4): qty 2, 28d supply, fill #0

## 2022-05-06 NOTE — Patient Instructions (Addendum)
The 10-year ASCVD risk score (Arnett DK, et al., 2019) is: 4.9%   Values used to calculate the score:     Age: 57 years     Sex: Female     Is Non-Hispanic African American: Yes     Diabetic: Yes     Tobacco smoker: No     Systolic Blood Pressure: 540 mmHg     Is BP treated: Yes     HDL Cholesterol: 93 mg/dL     Total Cholesterol: 211 mg/dL  Coronary Calcium Scan A coronary calcium scan is an imaging test used to look for deposits of plaque in the inner lining of the blood vessels of the heart (coronary arteries). Plaque is made up of calcium, protein, and fatty substances. These deposits of plaque can partly clog and narrow the coronary arteries without producing any symptoms or warning signs. This puts a person at risk for a heart attack. A coronary calcium scan is performed using a computed tomography (CT) scanner machine without using a dye (contrast). This test is recommended for people who are at moderate risk for heart disease. The test can find plaque deposits before symptoms develop. Tell a health care provider about: Any allergies you have. All medicines you are taking, including vitamins, herbs, eye drops, creams, and over-the-counter medicines. Any problems you or family members have had with anesthetic medicines. Any bleeding problems you have. Any surgeries you have had. Any medical conditions you have. Whether you are pregnant or may be pregnant. What are the risks? Generally, this is a safe procedure. However, problems may occur, including: Harm to a pregnant woman and her unborn baby. This test involves the use of radiation. Radiation exposure can be dangerous to a pregnant woman and her unborn baby. If you are pregnant or think you may be pregnant, you should not have this procedure done. A slight increase in the risk of cancer. This is because of the radiation involved in the test. The amount of radiation from one test is similar to the amount of radiation you are  naturally exposed to over one year. What happens before the procedure? Ask your health care provider for any specific instructions on how to prepare for this procedure. You may be asked to avoid products that contain caffeine, tobacco, or nicotine for 4 hours before the procedure. What happens during the procedure?  You will undress and remove any jewelry from your neck or chest. You may need to remove hearing aides and dentures. Women may need to remove their bras. You will put on a hospital gown. Sticky electrodes will be placed on your chest. The electrodes will be connected to an electrocardiogram (ECG) machine to record a tracing of the electrical activity of your heart. You will lie down on your back on a curved bed that is attached to the [place]. You may be given medicine to slow down your heart rate so that clear pictures can be created. You will be moved into the CT scanner, and the CT scanner will take pictures of your heart. During this time, you will be asked to lie still and hold your breath for 10-20 seconds at a time while each picture of your heart is being taken. The procedure may vary among health care providers and hospitals. What can I expect after the procedure? You can return to your normal activities. It is up to you to get the results of your procedure. Ask your health care provider, or the department that is doing  the procedure, when your results will be ready. Summary A coronary calcium scan is an imaging test used to look for deposits of plaque in the inner lining of the blood vessels of the heart. Plaque is made up of calcium, protein, and fatty substances. A coronary calcium scan is performed using a CT scanner machine without contrast. Generally, this is a safe procedure. Tell your health care provider if you are pregnant or may be pregnant. Ask your health care provider for any specific instructions on how to prepare for this procedure. You can return to your  normal activities after the scan is done. This information is not intended to replace advice given to you by your health care provider. Make sure you discuss any questions you have with your health care provider. Document Revised: 03/01/2021 Document Reviewed: 03/01/2021 Elsevier Patient Education  [place].  Diabetes Mellitus and Nutrition, Adult When you have diabetes, or diabetes mellitus, it is very important to have healthy eating habits because your blood sugar (glucose) levels are greatly affected by what you eat and drink. Eating healthy foods in the right amounts, at about the same times every day, can help you: Manage your blood glucose. Lower your risk of heart disease. Improve your blood pressure. Reach or maintain a healthy weight. What can affect my meal plan? Every person with diabetes is different, and each person has different needs for a meal plan. Your health care provider may recommend that you work with a dietitian to make a meal plan that is best for you. Your meal plan may vary depending on factors such as: The calories you need. The medicines you take. Your weight. Your blood glucose, blood pressure, and cholesterol levels. Your activity level. Other health conditions you have, such as heart or kidney disease. How do carbohydrates affect me? Carbohydrates, also called carbs, affect your blood glucose level more than any other type of food. Eating carbs raises the amount of glucose in your blood. It is important to know how many carbs you can safely have in each meal. This is different for every person. Your dietitian can help you calculate how many carbs you should have at each meal and for each snack. How does alcohol affect me? Alcohol can cause a decrease in blood glucose (hypoglycemia), especially if you use insulin or take certain diabetes medicines by mouth. Hypoglycemia can be a life-threatening condition. Symptoms of hypoglycemia, such as sleepiness,  dizziness, and confusion, are similar to symptoms of having too much alcohol. Do not drink alcohol if: Your health care provider tells you not to drink. You are pregnant, may be pregnant, or are planning to become pregnant. If you drink alcohol: Limit how much you have to: 0-1 drink a day for women. 0-2 drinks a day for men. Know how much alcohol is in your drink. In the [place] one drink equals one 12 oz bottle of beer (355 mL), one 5 oz glass of wine (148 mL), or one 1 oz glass of hard liquor (44 mL). Keep yourself hydrated with water, diet soda, or unsweetened iced tea. Keep in mind that regular soda, juice, and other mixers may contain a lot of sugar and must be counted as carbs. What are tips for following this plan?  Reading food labels Start by checking the serving size on the Nutrition Facts label of packaged foods and drinks. The number of calories and the amount of carbs, fats, and other nutrients listed on the label are based on one serving  of the item. Many items contain more than one serving per package. Check the total grams (g) of carbs in one serving. Check the number of grams of saturated fats and trans fats in one serving. Choose foods that have a low amount or none of these fats. Check the number of milligrams (mg) of salt (sodium) in one serving. Most people should limit total sodium intake to less than 2,300 mg per day. Always check the nutrition information of foods labeled as "low-fat" or "nonfat." These foods may be higher in added sugar or refined carbs and should be avoided. Talk to your dietitian to identify your daily goals for nutrients listed on the label. Shopping Avoid buying canned, pre-made, or processed foods. These foods tend to be high in fat, sodium, and added sugar. Shop around the outside edge of the grocery store. This is where you will most often find fresh fruits and vegetables, bulk grains, fresh meats, and fresh dairy products. Cooking Use low-heat  cooking methods, such as baking, instead of high-heat cooking methods, such as deep frying. Cook using healthy oils, such as olive, canola, or sunflower oil. Avoid cooking with butter, cream, or high-fat meats. Meal planning Eat meals and snacks regularly, preferably at the same times every day. Avoid going long periods of time without eating. Eat foods that are high in fiber, such as fresh fruits, vegetables, beans, and whole grains. Eat 4-6 oz (112-168 g) of lean protein each day, such as lean meat, chicken, fish, eggs, or tofu. One ounce (oz) (28 g) of lean protein is equal to: 1 oz (28 g) of meat, chicken, or fish. 1 egg.  cup (62 g) of tofu. Eat some foods each day that contain healthy fats, such as avocado, nuts, seeds, and fish. What foods should I eat? Fruits Berries. Apples. Oranges. Peaches. Apricots. Plums. Grapes. Mangoes. Papayas. Pomegranates. Kiwi. Cherries. Vegetables Leafy greens, including lettuce, spinach, kale, chard, collard greens, mustard greens, and cabbage. Beets. Cauliflower. Broccoli. Carrots. Green beans. Tomatoes. Peppers. Onions. Cucumbers. Brussels sprouts. Grains Whole grains, such as whole-wheat or whole-grain bread, crackers, tortillas, cereal, and pasta. Unsweetened oatmeal. Quinoa. Brown or wild rice. Meats and other proteins Seafood. Poultry without skin. Lean cuts of poultry and beef. Tofu. Nuts. Seeds. Dairy Low-fat or fat-free dairy products such as milk, yogurt, and cheese. The items listed above may not be a complete list of foods and beverages you can eat and drink. Contact a dietitian for more information. What foods should I avoid? Fruits Fruits canned with syrup. Vegetables Canned vegetables. Frozen vegetables with butter or cream sauce. Grains Refined white flour and flour products such as bread, pasta, snack foods, and cereals. Avoid all processed foods. Meats and other proteins Fatty cuts of meat. Poultry with skin. Breaded or fried  meats. Processed meat. Avoid saturated fats. Dairy Full-fat yogurt, cheese, or milk. Beverages Sweetened drinks, such as soda or iced tea. The items listed above may not be a complete list of foods and beverages you should avoid. Contact a dietitian for more information. Questions to ask a health care provider Do I need to meet with a certified diabetes care and education specialist? Do I need to meet with a dietitian? What number can I call if I have questions? When are the best times to check my blood glucose? Where to find more information: American Diabetes Association: diabetes.org Academy of Nutrition and Dietetics: eatright.[employer] of Diabetes and Digestive and Kidney Diseases: [employer] Association of Diabetes Care & Education Specialists: diabeteseducator.org  Summary It is important to have healthy eating habits because your blood sugar (glucose) levels are greatly affected by what you eat and drink. It is important to use alcohol carefully. A healthy meal plan will help you manage your blood glucose and lower your risk of heart disease. Your health care provider may recommend that you work with a dietitian to make a meal plan that is best for you. This information is not intended to replace advice given to you by your health care provider. Make sure you discuss any questions you have with your health care provider. Document Revised: 10/24/2019 Document Reviewed: 10/24/2019 Elsevier Patient Education  [place].

## 2022-05-07 ENCOUNTER — Encounter: Payer: Self-pay | Admitting: Internal Medicine

## 2022-05-07 LAB — CMP14+EGFR
ALT: 10 IU/L (ref 0–32)
AST: 14 IU/L (ref 0–40)
Albumin/Globulin Ratio: 1.9 (ref 1.2–2.2)
Albumin: 4.6 g/dL (ref 3.8–4.9)
Alkaline Phosphatase: 85 IU/L (ref 44–121)
BUN/Creatinine Ratio: 13 (ref 9–23)
BUN: 10 mg/dL (ref 6–24)
Bilirubin Total: 0.6 mg/dL (ref 0.0–1.2)
CO2: 27 mmol/L (ref 20–29)
Calcium: 10 mg/dL (ref 8.7–10.2)
Chloride: 99 mmol/L (ref 96–106)
Creatinine, Ser: 0.75 mg/dL (ref 0.57–1.00)
Globulin, Total: 2.4 g/dL (ref 1.5–4.5)
Glucose: 90 mg/dL (ref 70–99)
Potassium: 3.7 mmol/L (ref 3.5–5.2)
Sodium: 141 mmol/L (ref 134–144)
Total Protein: 7 g/dL (ref 6.0–8.5)
eGFR: 95 mL/min/{1.73_m2} (ref >59)

## 2022-05-07 LAB — LIPID PANEL
Chol/HDL Ratio: 2.7 ratio (ref 0.0–4.4)
Cholesterol, Total: 228 mg/dL — ABNORMAL HIGH (ref 100–199)
HDL: 86 mg/dL (ref >39)
LDL Chol Calc (NIH): 133 mg/dL — ABNORMAL HIGH (ref 0–99)
Triglycerides: 50 mg/dL (ref 0–149)
VLDL Cholesterol Cal: 9 mg/dL (ref 5–40)

## 2022-05-07 LAB — HEMOGLOBIN A1C
Est. average glucose Bld gHb Est-mCnc: 123 mg/dL
Hgb A1c MFr Bld: 5.9 % — ABNORMAL HIGH (ref 4.8–5.6)

## 2022-05-10 ENCOUNTER — Other Ambulatory Visit (HOSPITAL_COMMUNITY): Payer: Self-pay

## 2022-05-13 ENCOUNTER — Telehealth: Payer: Self-pay

## 2022-05-13 NOTE — Telephone Encounter (Signed)
Patient notified [place] approved for Zepbound until 11/09/2022

## 2022-05-31 ENCOUNTER — Other Ambulatory Visit (HOSPITAL_COMMUNITY): Payer: Self-pay

## 2022-05-31 ENCOUNTER — Telehealth: Payer: Self-pay

## 2022-05-31 ENCOUNTER — Other Ambulatory Visit: Payer: Self-pay

## 2022-05-31 NOTE — Telephone Encounter (Signed)
The request has been approved. The authorization is effective from 05/13/2022 to 11/09/2022, as long as the member is enrolled in their current health plan. The request was reviewed and approved by a licensed clinical pharmacist. This request is approved for a quantity of 6m per 28 days.Additional authorizations, eligible from 05/13/2022 through 11/09/2022, have been entered for the following:Zepbound 2.'5mg'$ /0.5559mpens, allowing a quantity of 59m48mer 28 days, please reference authorization [id number].Zepbound 7.'5mg'$ /0.5mL61mns, allowing a quantity of 59mL 77m 28 days, please reference authorization [id number] '10mg'$ /0.5mL p57m, allowing a quantity of 59mL pe40m8 days, please reference authorization [id number] 12.'5mg'$ /0.5mL pen39mallowing a quantity of 59mL per 759mdays, please reference authorization [id number] '15mg'$ /0.5mL pens,84mlowing a quantity of 59mL per 2876mys, please reference authorization [id number]. [id number] notification letter will follow with additional details.

## 2022-06-01 ENCOUNTER — Other Ambulatory Visit: Payer: Self-pay | Admitting: Internal Medicine

## 2022-06-01 DIAGNOSIS — I10 Essential (primary) hypertension: Secondary | ICD-10-CM

## 2022-06-02 ENCOUNTER — Other Ambulatory Visit: Payer: Self-pay

## 2022-06-02 ENCOUNTER — Other Ambulatory Visit (HOSPITAL_COMMUNITY): Payer: Self-pay

## 2022-06-02 MED ORDER — OLMESARTAN MEDOXOMIL-HCTZ 40-25 MG PO TABS
1.0000 | ORAL_TABLET | Freq: Every day | ORAL | 1 refills | Status: DC
  Filled 2022-06-02: qty 90, 90d supply, fill #0
  Filled 2022-09-03: qty 90, 90d supply, fill #1

## 2022-06-16 ENCOUNTER — Other Ambulatory Visit: Payer: Self-pay | Admitting: Internal Medicine

## 2022-06-16 ENCOUNTER — Encounter: Payer: Self-pay | Admitting: Internal Medicine

## 2022-06-16 DIAGNOSIS — E7800 Pure hypercholesterolemia, unspecified: Secondary | ICD-10-CM

## 2022-06-16 DIAGNOSIS — E1169 Type 2 diabetes mellitus with other specified complication: Secondary | ICD-10-CM

## 2022-06-22 ENCOUNTER — Ambulatory Visit: Payer: 59 | Admitting: Internal Medicine

## 2022-06-22 ENCOUNTER — Ambulatory Visit: Payer: Self-pay

## 2022-06-22 ENCOUNTER — Other Ambulatory Visit: Payer: Self-pay | Admitting: Family Medicine

## 2022-06-22 DIAGNOSIS — M25511 Pain in right shoulder: Secondary | ICD-10-CM

## 2022-06-28 ENCOUNTER — Other Ambulatory Visit: Payer: Self-pay

## 2022-06-28 ENCOUNTER — Other Ambulatory Visit (HOSPITAL_COMMUNITY): Payer: Self-pay

## 2022-06-28 ENCOUNTER — Other Ambulatory Visit: Payer: Self-pay | Admitting: Internal Medicine

## 2022-06-28 MED ORDER — ZEPBOUND 5 MG/0.5ML ~~LOC~~ SOAJ
5.0000 mg | SUBCUTANEOUS | 0 refills | Status: DC
  Filled 2022-06-28 – 2022-06-30 (×2): qty 2, 28d supply, fill #0

## 2022-06-29 ENCOUNTER — Other Ambulatory Visit (HOSPITAL_COMMUNITY): Payer: Self-pay

## 2022-06-30 ENCOUNTER — Other Ambulatory Visit (HOSPITAL_COMMUNITY): Payer: Self-pay

## 2022-07-01 ENCOUNTER — Ambulatory Visit (HOSPITAL_COMMUNITY): Payer: 59

## 2022-07-01 ENCOUNTER — Ambulatory Visit (HOSPITAL_COMMUNITY)
Admission: RE | Admit: 2022-07-01 | Discharge: 2022-07-01 | Disposition: A | Discharge status: Home / Self Care | Payer: Self-pay | Source: Ambulatory Visit | Attending: Internal Medicine | Admitting: Internal Medicine

## 2022-07-01 DIAGNOSIS — E669 Obesity, unspecified: Secondary | ICD-10-CM | POA: Insufficient documentation

## 2022-07-01 DIAGNOSIS — E1159 Type 2 diabetes mellitus with other circulatory complications: Secondary | ICD-10-CM | POA: Insufficient documentation

## 2022-07-01 DIAGNOSIS — E7800 Pure hypercholesterolemia, unspecified: Secondary | ICD-10-CM

## 2022-07-01 DIAGNOSIS — I152 Hypertension secondary to endocrine disorders: Secondary | ICD-10-CM | POA: Insufficient documentation

## 2022-07-01 DIAGNOSIS — E119 Type 2 diabetes mellitus without complications: Secondary | ICD-10-CM

## 2022-07-01 DIAGNOSIS — E1169 Type 2 diabetes mellitus with other specified complication: Secondary | ICD-10-CM | POA: Insufficient documentation

## 2022-07-05 ENCOUNTER — Other Ambulatory Visit: Payer: Self-pay

## 2022-07-05 ENCOUNTER — Ambulatory Visit: Payer: PRIVATE HEALTH INSURANCE | Attending: Family Medicine | Admitting: Physical Therapy

## 2022-07-05 ENCOUNTER — Encounter: Payer: Self-pay | Admitting: Physical Therapy

## 2022-07-05 DIAGNOSIS — M6281 Muscle weakness (generalized): Secondary | ICD-10-CM | POA: Insufficient documentation

## 2022-07-05 DIAGNOSIS — M25511 Pain in right shoulder: Secondary | ICD-10-CM | POA: Diagnosis present

## 2022-07-05 NOTE — Therapy (Signed)
OUTPATIENT PHYSICAL THERAPY EVALUATION   Patient Name: Mackenzie Clark MRN: [id number] DOB:09-30-1965, 57 y.o., female Today's Date: 07/05/2022   END OF SESSION:  PT End of Session - 07/05/22 0859     Visit Number 1    Number of Visits 17    Date for PT Re-Evaluation 08/30/22    Authorization Type WC / [facility] - Visit Number 1    Authorization - Number of Visits 8    PT Start Time 0845    PT Stop Time 0925    PT Time Calculation (min) 40 min    Activity Tolerance Patient tolerated treatment well    Behavior During Therapy WFL for tasks assessed/performed             Past Medical History:  Diagnosis Date   Allergy    Diabetes mellitus without complication    diet and exercise controlled   Hypertension    Past Surgical History:  Procedure Laterality Date   DILATION AND CURETTAGE OF UTERUS  2006   16 week stillbirth   IR ANGIOGRAM PELVIS SELECTIVE OR SUPRASELECTIVE  08/31/2019   IR ANGIOGRAM SELECTIVE EACH ADDITIONAL VESSEL  08/31/2019   IR EMBO TUMOR ORGAN ISCHEMIA INFARCT INC GUIDE ROADMAPPING  08/31/2019   IR FLUORO GUIDED NEEDLE PLC ASPIRATION/INJECTION LOC  08/31/2019   IR RADIOLOGIST EVAL & MGMT  07/10/2019   IR RADIOLOGIST EVAL & MGMT  09/18/2019   IR US GUIDE VASC ACCESS RIGHT  08/31/2019   Patient Active Problem List   Diagnosis Date Noted   Pure hypercholesterolemia 05/06/2022   Overweight with body mass index (BMI) of 29 to 29.9 in adult 01/22/2021   Obesity, diabetes, and hypertension syndrome 01/22/2021   Fibroids 08/31/2019   Abnormal uterine bleeding due to intramural leiomyoma 08/31/2019   Abnormal uterine bleeding (AUB) 05/21/2019   Uterine leiomyoma 05/21/2019   Body mass index 33.0-33.9, adult 09/03/2017   Type II diabetes mellitus, uncontrolled 09/03/2017   Obesity 09/03/2017   Essential hypertension, benign 03/05/2013   Surveillance of previously prescribed contraceptive pill 03/05/2013    PCP: Glendale Chard, MD  REFERRING  PROVIDER: Odis Luster, MD  REFERRING DIAG: Contusion of right shoulder  THERAPY DIAG:  Acute pain of right shoulder  Muscle weakness (generalized)  Rationale for Evaluation and Treatment: Rehabilitation  ONSET DATE: 06/15/2022   SUBJECTIVE:                                                                                                                                                                                     SUBJECTIVE STATEMENT: Patient reports right shoulder pain following work injury. She fell when she tripped  over a vinyl mat that goes under a desk chair. She landed on her right side on the office floor. The shoulder doesn't hurt as much as it did during the initial doctors visit, but does hurt when she reaches out in front using the right arm. She states she totes a laptop around during the day at work, and she was given a 15 pound work restriction, not to lift/push/pull more than 15 pounds.   Hand dominance: Left  PERTINENT HISTORY: None  PAIN:  Are you having pain? Yes:  NPRS scale: 0/10 at rest, 6/10 with using right arm Pain location: Right shoulder Pain description: Aching Aggravating factors: Reach out in front or overhead, reaching behind back, lifting Relieving factors: Medication, rest, heat  PRECAUTIONS: None  WEIGHT BEARING RESTRICTIONS: No  FALLS:  Has patient fallen in last 6 months? Yes. 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  PATIENT SURVEYS:  FOTO 57% functional status  COGNITION: Overall cognitive status: Within functional limits for tasks assessed     SENSATION: WFL  POSTURE: Rounded shoulder posture  UPPER EXTREMITY ROM:   Active ROM Right eval Left eval  Shoulder flexion 160 160  Shoulder extension    Shoulder abduction 160* 160   Shoulder adduction    Shoulder internal rotation T12 T8  Shoulder external rotation 65 / T2 65 / T4  Elbow flexion    Elbow extension    Wrist flexion    Wrist extension    Wrist ulnar deviation    Wrist radial deviation    Wrist pronation    Wrist supination    (Blank rows = not tested)  Painful arc with right shoulder abduction, pain with functional reaches  UPPER EXTREMITY MMT:  MMT Right eval Left eval  Shoulder flexion 4 5  Shoulder extension 5 5  Shoulder abduction 4 5  Shoulder adduction    Shoulder internal rotation 5 5  Shoulder external rotation 4 5  Middle trapezius    Lower trapezius    Elbow flexion 5 5  Elbow extension 5 5  Wrist flexion    Wrist extension    Wrist ulnar deviation    Wrist radial deviation    Wrist pronation    Wrist supination    Grip strength (lbs)    (Blank rows = not tested)  SHOULDER SPECIAL TESTS: Impingement tests: Hawkins/Kennedy impingement test: positive  SLAP lesions: Biceps load test: negative Instability tests: Load and shift test: negative Biceps assessment: Speed's test: positive   JOINT MOBILITY TESTING:  Grossly WFL  PALPATION:  Tender to palpation anterior shoulder, bicep tendon region    TODAY'S TREATMENT:          [facility] Adult PT Treatment:                                                DATE: 07/05/2022 Therapeutic Exercise: Row with yellow x 15 Extension and scap retraction with yellow x 15 ER with yellow x 15  PATIENT EDUCATION: Education details: Exam findings, POC, HEP Person educated: Patient Education method: Explanation, Demonstration, Tactile cues, Verbal cues, and Handouts Education comprehension: verbalized understanding, returned demonstration, verbal cues required, tactile cues required, and needs further education  HOME EXERCISE PROGRAM: Access Code: [email or web address]    ASSESSMENT: CLINICAL IMPRESSION: Patient is a 57 y.o. female who was seen today for physical therapy evaluation and treatment  for right shoulder pain following fall at work. Overall she demonstrates well preserved right shoulder motion but does report pain in all planes of movement, gross strength deficits of the right shoulder likely due to pain limitations, that are impacting her functional ability and work related tasks.   OBJECTIVE IMPAIRMENTS: decreased activity tolerance, decreased ROM, decreased strength, postural dysfunction, and pain.   ACTIVITY LIMITATIONS: carrying, lifting, dressing, and reach over head  PARTICIPATION LIMITATIONS: meal prep, cleaning, and occupation  PERSONAL FACTORS: Profession and Time since onset of injury/illness/exacerbation are also affecting patient's functional outcome.   REHAB POTENTIAL: Good  CLINICAL DECISION MAKING: Stable/uncomplicated  EVALUATION COMPLEXITY: Low   GOALS: Goals reviewed with patient? Yes  SHORT TERM GOALS: Target date: 08/02/2022  Patient will be I with initial HEP in order to progress with therapy. Baseline: HEP provided at eval Goal status: INITIAL  2.  PT will review FOTO with patient by 3rd visit in order to understand expected progress and outcome with therapy. Baseline: FOTO assessed at eval Goal status: INITIAL  3.  Patient will demonstrate right shoulder motion grossly WFL and equal to opposite without increase in pain in order to improve overhead reach and dressing ability Baseline: see limitations and pain noted above Goal status: INITIAL  LONG TERM GOALS: Target date: 08/30/2022  Patient will be I with final HEP to maintain progress from PT. Baseline: HEP provided at eval Goal status: INITIAL  2.  Patient will report >/= 72% status on FOTO to indicate improved functional ability. Baseline: 57% functional status Goal status: INITIAL  3.  Patient will demonstrate right shoulder strength 5/5 MMT to improve lifting and carrying using the right arm Baseline: see limitations noted above Goal status: INITIAL  4.  Patient will  report right shoulder pain with activity </= 2/10 in order to reduce functional limitations Baseline: 6/10 Goal status: INITIAL  PLAN: PT FREQUENCY: 1-2x/week  PT DURATION: 8 weeks  PLANNED INTERVENTIONS: Therapeutic exercises, Therapeutic activity, Neuromuscular re-education, Balance training, Gait training, Patient/Family education, Self Care, Joint mobilization, Aquatic Therapy, Dry Needling, Cryotherapy, Moist heat, Ionotophoresis 4mg /ml Dexamethasone, Manual therapy, and Re-evaluation  PLAN FOR NEXT SESSION: Review HEP and progress PRN, manual/mobilizations for shoulder motion and pain, progress rotator cuff and periscapular strengthening, light stretching for hand behind back   Hilda Blades, PT, DPT, LAT, ATC 07/05/22  9:47 AM Phone: [phone] Fax: [phone]

## 2022-07-05 NOTE — Patient Instructions (Signed)
Access Code: [email or web address] URL: [email or web address] Date: 07/05/2022 Prepared by: Hilda Blades  Exercises - Standing Row with Anchored Resistance  - 1 x daily - 2 sets - 15 reps - Scapular Retraction with Resistance Advanced  - 1 x daily - 2 sets - 15 reps - Shoulder External Rotation with Anchored Resistance  - 1 x daily - 2 sets - 15 reps

## 2022-07-06 ENCOUNTER — Ambulatory Visit: Payer: Commercial Managed Care - PPO | Admitting: Internal Medicine

## 2022-07-06 ENCOUNTER — Encounter: Payer: Self-pay | Admitting: Internal Medicine

## 2022-07-06 VITALS — BP 118/82 | HR 76 | Temp 98.1°F | Ht 67.0 in | Wt 200.8 lb

## 2022-07-06 DIAGNOSIS — E1169 Type 2 diabetes mellitus with other specified complication: Secondary | ICD-10-CM

## 2022-07-06 DIAGNOSIS — M25511 Pain in right shoulder: Secondary | ICD-10-CM

## 2022-07-06 DIAGNOSIS — Z6831 Body mass index (BMI) 31.0-31.9, adult: Secondary | ICD-10-CM | POA: Diagnosis not present

## 2022-07-06 DIAGNOSIS — W010XXD Fall on same level from slipping, tripping and stumbling without subsequent striking against object, subsequent encounter: Secondary | ICD-10-CM | POA: Diagnosis not present

## 2022-07-06 DIAGNOSIS — E6609 Other obesity due to excess calories: Secondary | ICD-10-CM

## 2022-07-06 DIAGNOSIS — Z8249 Family history of ischemic heart disease and other diseases of the circulatory system: Secondary | ICD-10-CM

## 2022-07-06 DIAGNOSIS — I700 Atherosclerosis of aorta: Secondary | ICD-10-CM | POA: Diagnosis not present

## 2022-07-06 DIAGNOSIS — E785 Hyperlipidemia, unspecified: Secondary | ICD-10-CM

## 2022-07-06 MED ORDER — MOUNJARO 7.5 MG/0.5ML ~~LOC~~ SOAJ
7.5000 mg | SUBCUTANEOUS | 0 refills | Status: DC
  Filled 2022-07-06: qty 2, 28d supply, fill #0

## 2022-07-06 MED ORDER — ATORVASTATIN CALCIUM 20 MG PO TABS
20.0000 mg | ORAL_TABLET | ORAL | 11 refills | Status: DC
  Filled 2022-07-06: qty 30, 70d supply, fill #0

## 2022-07-06 NOTE — Progress Notes (Unsigned)
I,Mackenzie Clark,acting as a scribe for Mackenzie Greenland, MD.,have documented all relevant documentation on the behalf of Mackenzie Greenland, MD,as directed by  Mackenzie Greenland, MD while in the presence of Mackenzie Greenland, MD.    Subjective:     Patient ID: Mackenzie Clark , female    DOB: 05-Apr-1966 , 57 y.o.   MRN: [id number]   Chief Complaint  Patient presents with   Weight Check    HPI  Pt presents today for weight check. She reports receiving Zepbound. However, her insurance will no longer cover the medication after 07/30/22. She has not had any issues with the medication. She has been on medication for about 5 weeks.  She denies having any nausea, diarrhea, vomiting.   She reports a fall a week ago at work, she sprained her right shoulder. She states she tripped over mat underneath her chair. She fell to the ground onto her R shoulder. She was assessed by one of the providers at her clinic and she has since started physical therapy.    Diabetes She presents for her follow-up diabetic visit. She has type 2 diabetes mellitus. Her disease course has been stable. There are no hypoglycemic associated symptoms. Pertinent negatives for diabetes include no blurred vision and no chest pain. There are no hypoglycemic complications. Risk factors for coronary artery disease include diabetes mellitus, hypertension and obesity. She is compliant with treatment most of the time. She participates in exercise intermittently. An ACE inhibitor/angiotensin II receptor blocker is being taken. Eye exam is current.  Hypertension This is a chronic problem. The current episode started more than 1 year ago. The problem has been gradually improving since onset. The problem is controlled. Pertinent negatives include no blurred vision or chest pain.     Past Medical History:  Diagnosis Date   Allergy    Diabetes mellitus without complication    diet and exercise controlled   Hypertension      Family  History  Problem Relation Age of Onset   Hypertension Mother    Hyperlipidemia Mother    Hypertension Father    Diabetes Father    Heart disease Father        heart attack age 40   Hypertension Brother    Cancer Maternal Grandmother 79       Breast   Obesity Other    Colon cancer Neg Hx    Colon polyps Neg Hx    Esophageal cancer Neg Hx    Rectal cancer Neg Hx    Stomach cancer Neg Hx      Current Outpatient Medications:    amLODipine (NORVASC) 10 MG tablet, Take 1 tablet (10 mg total) by mouth daily., Disp: 90 tablet, Rfl: 2   atorvastatin (LIPITOR) 20 MG tablet, Take 1 tablet (20 mg total) by mouth 3 (three) times a week on MWF., Disp: 30 tablet, Rfl: 11   fluticasone (FLONASE) 50 MCG/ACT nasal spray, Place 2 sprays into both nostrils daily., Disp: 54 g, Rfl: 2   glucose blood test strip, 1 each by Other route 2 (two) times daily. Use as instructed, Disp: , Rfl:    olmesartan-hydrochlorothiazide (BENICAR HCT) 40-25 MG tablet, Take 1 tablet by mouth daily., Disp: 90 tablet, Rfl: 1   tirzepatide (MOUNJARO) 7.5 MG/0.5ML Pen, Inject 7.5 mg into the skin once a week., Disp: 2 mL, Rfl: 0   No Known Allergies   Review of Systems  Constitutional: Negative.   Eyes:  Negative for blurred  vision.  Respiratory: Negative.    Cardiovascular: Negative.  Negative for chest pain.  Gastrointestinal: Negative.   Musculoskeletal:  Positive for arthralgias.  Skin: Negative.   Neurological: Negative.   Psychiatric/Behavioral: Negative.       Today's Vitals   07/06/22 1454  BP: 118/82  Pulse: 76  Temp: 98.1 F (36.7 C)  SpO2: 98%  Weight: 200 lb 12.8 oz (91.1 kg)  Height: 5\' 7"  (1.702 m)   Body mass index is 31.45 kg/m.  Wt Readings from Last 3 Encounters:  07/06/22 200 lb 12.8 oz (91.1 kg)  05/06/22 199 lb (90.3 kg)  02/01/22 199 lb 9.6 oz (90.5 kg)    Objective:  Physical Exam Vitals and nursing note reviewed.  Constitutional:      Appearance: Normal appearance.  HENT:      Head: Normocephalic and atraumatic.     Nose:     Comments: Masked     Mouth/Throat:     Comments: Masked  Eyes:     Extraocular Movements: Extraocular movements intact.  Cardiovascular:     Rate and Rhythm: Normal rate and regular rhythm.     Heart sounds: Normal heart sounds.  Pulmonary:     Effort: Pulmonary effort is normal.     Breath sounds: Normal breath sounds.  Musculoskeletal:        General: Tenderness present.     Cervical back: Normal range of motion.  Skin:    General: Skin is warm.  Neurological:     General: No focal deficit present.     Mental Status: She is alert.  Psychiatric:        Mood and Affect: Mood normal.        Behavior: Behavior normal.         Assessment And Plan:     1. Class 1 obesity due to excess calories with serious comorbidity and body mass index (BMI) of 31.0 to 31.9 in adult Comments: She is encouraged to aim for at least 150 minutes of exercise per week. Zepbound d/c due to insurance. She will rto in 10 weeks for re-evaluation.  2. Dyslipidemia associated with type 2 diabetes mellitus Comments: Does not wish to start statin therapy. I will start Mounjaro 7.5, currently on Zepbound for weight loss. - Ambulatory referral to Cardiology  3. Aortic atherosclerosis Comments: Chronic, seen on cardiac calcium CT. Her LDL goal < 70. She should start statin therapy.  She is willing to start statin therapy w/ MWF dosing. - Ambulatory referral to Cardiology  4. Acute pain of right shoulder Comments: She is now in PT as per PM&R.  5. Fall on same level from slipping, tripping or stumbling, subsequent encounter Comments: Occurred on 06/29/22.  6. Family history of heart disease Comments: She has aortic atherosclerosis. She agrees to Cardiology referral for further risk assessment. - Ambulatory referral to Cardiology    Patient was given opportunity to ask questions. Patient verbalized understanding of the plan and was able to repeat key  elements of the plan. All questions were answered to their satisfaction.   I, Mackenzie Greenland, MD, have reviewed all documentation for this visit. The documentation on 07/06/22 for the exam, diagnosis, procedures, and orders are all accurate and complete.   IF YOU HAVE BEEN REFERRED TO A SPECIALIST, IT MAY TAKE 1-2 WEEKS TO SCHEDULE/PROCESS THE REFERRAL. IF YOU HAVE NOT HEARD FROM US/SPECIALIST IN TWO WEEKS, PLEASE GIVE [place] A CALL AT [phone].   THE PATIENT IS ENCOURAGED TO  PRACTICE SOCIAL DISTANCING DUE TO THE COVID-19 PANDEMIC.

## 2022-07-06 NOTE — Patient Instructions (Signed)

## 2022-07-07 ENCOUNTER — Other Ambulatory Visit: Payer: Self-pay

## 2022-07-08 ENCOUNTER — Other Ambulatory Visit: Payer: Self-pay

## 2022-07-09 ENCOUNTER — Other Ambulatory Visit: Payer: Self-pay

## 2022-07-15 DIAGNOSIS — M25511 Pain in right shoulder: Secondary | ICD-10-CM | POA: Diagnosis present

## 2022-07-15 DIAGNOSIS — M6281 Muscle weakness (generalized): Secondary | ICD-10-CM | POA: Diagnosis present

## 2022-07-19 NOTE — Therapy (Signed)
OUTPATIENT PHYSICAL THERAPY TREATMENT NOTE   Patient Name: Mackenzie Clark MRN: [id number] DOB:13-Feb-1966, 57 y.o., female Today's Date: 07/20/2022   PCP: Dorothyann Peng, MD REFERRING PROVIDER: Lanell Persons, MD   END OF SESSION:   PT End of Session - 07/20/22 1623     Visit Number 2    Number of Visits 17    Date for PT Re-Evaluation 08/30/22    Authorization Type [facility] / [facility] [employer]    Authorization - Visit Number 2    Authorization - Number of Visits 8    PT Start Time 1619    PT Stop Time 1700    PT Time Calculation (min) 41 min    Activity Tolerance Patient tolerated treatment well    Behavior During Therapy WFL for tasks assessed/performed             Past Medical History:  Diagnosis Date   Allergy    Diabetes mellitus without complication    diet and exercise controlled   Hypertension    Past Surgical History:  Procedure Laterality Date   DILATION AND CURETTAGE OF UTERUS  2006   16 week stillbirth   IR ANGIOGRAM PELVIS SELECTIVE OR SUPRASELECTIVE  08/31/2019   IR ANGIOGRAM SELECTIVE EACH ADDITIONAL VESSEL  08/31/2019   IR EMBO TUMOR ORGAN ISCHEMIA INFARCT INC GUIDE ROADMAPPING  08/31/2019   IR FLUORO GUIDED NEEDLE PLC ASPIRATION/INJECTION LOC  08/31/2019   IR RADIOLOGIST EVAL & MGMT  07/10/2019   IR RADIOLOGIST EVAL & MGMT  09/18/2019   IR US GUIDE VASC ACCESS RIGHT  08/31/2019   Patient Active Problem List   Diagnosis Date Noted   Pure hypercholesterolemia 05/06/2022   Overweight with body mass index (BMI) of 29 to 29.9 in adult 01/22/2021   Obesity, diabetes, and hypertension syndrome 01/22/2021   Fibroids 08/31/2019   Abnormal uterine bleeding due to intramural leiomyoma 08/31/2019   Abnormal uterine bleeding (AUB) 05/21/2019   Uterine leiomyoma 05/21/2019   Body mass index 33.0-33.9, adult 09/03/2017   Type II diabetes mellitus, uncontrolled 09/03/2017   Obesity 09/03/2017   Essential hypertension, benign 03/05/2013   Surveillance of previously  prescribed contraceptive pill 03/05/2013    REFERRING DIAG: Contusion of right shoulder   THERAPY DIAG:  Acute pain of right shoulder  Muscle weakness (generalized)  Rationale for Evaluation and Treatment Rehabilitation  PERTINENT HISTORY: None   PRECAUTIONS: None    SUBJECTIVE:                                                                                                                                                                                     SUBJECTIVE STATEMENT:  Patient reports she is  doing well, still feeling about the same.   PAIN:  Are you having pain? Yes:  NPRS scale: 0/10 at rest, 6/10 with using right arm Pain location: Right shoulder Pain description: Aching Aggravating factors: Reach out in front or overhead, reaching behind back, lifting Relieving factors: Medication, rest, heat   OBJECTIVE: (objective measures completed at initial evaluation unless otherwise dated) PATIENT SURVEYS:  FOTO 57% functional status  POSTURE: Rounded shoulder posture   UPPER EXTREMITY ROM:    Active ROM Right eval Left eval  Shoulder flexion 160 160  Shoulder extension      Shoulder abduction 160* 160  Shoulder adduction      Shoulder internal rotation T12 T8  Shoulder external rotation 65 / T2 65 / T4  Elbow flexion      Elbow extension      Wrist flexion      Wrist extension      Wrist ulnar deviation      Wrist radial deviation      Wrist pronation      Wrist supination      (Blank rows = not tested)   Painful arc with right shoulder abduction, pain with functional reaches   UPPER EXTREMITY MMT:   MMT Right eval Left eval  Shoulder flexion 4 5  Shoulder extension 5 5  Shoulder abduction 4 5  Shoulder adduction      Shoulder internal rotation 5 5  Shoulder external rotation 4 5  Middle trapezius      Lower trapezius      Elbow flexion 5 5  Elbow extension 5 5  Wrist flexion      Wrist extension      Wrist ulnar deviation      Wrist  radial deviation      Wrist pronation      Wrist supination      Grip strength (lbs)      (Blank rows = not tested)   SHOULDER SPECIAL TESTS: Impingement tests: Hawkins/Kennedy impingement test: positive  SLAP lesions: Biceps load test: negative Instability tests: Load and shift test: negative Biceps assessment: Speed's test: positive    JOINT MOBILITY TESTING:  Grossly WFL   PALPATION:  Tender to palpation anterior shoulder, bicep tendon region               TODAY'S TREATMENT:          [facility] Adult PT Treatment:                                                DATE: 07/20/2022 Therapeutic Exercise: NuStep L6 x 5 min with UE/LE while taking subjective Sidelying sleeper and cross body stretch 3 x 20 sec each Supine horizontal abduction with red 2 x 10 Supine serratus punch 2 x 10 Supine shoulder flexion >90 deg with red 2 x 10 Seated doubler ER and scap retraction 2 x 10 Row with green 2 x 10 Extension and scap retraction with green 2 x 10 ER / IR with green 2 x 10 Banded lateral wall taps with red 2 x 10 Standing scaption with 2# and back at wall 2 x 10   [facility] Adult PT Treatment:  DATE: 07/05/2022 Therapeutic Exercise: Row with yellow x 15 Extension and scap retraction with yellow x 15 ER with yellow x 15   PATIENT EDUCATION: Education details: HEP update Person educated: Patient Education method: Explanation, Demonstration, Tactile cues, Verbal cues, Handout Education comprehension: verbalized understanding, returned demonstration, verbal cues required, tactile cues required, and needs further education   HOME EXERCISE PROGRAM: Access Code: [email or web address]      ASSESSMENT: CLINICAL IMPRESSION: Patient tolerated therapy well with no adverse effects. Therapy focused on progressing shoulder and periscapular strength with good tolerance. She was able to increase resistance with strengthening exercises and did report soreness post  session. Incorporated some posterior cuff stretching with good tolerance. Updated HEP to progress strengthening for home. Patient would benefit from continued skilled PT to progress shoulder strength and mobility in order to reduce pain and maximize functional ability.     OBJECTIVE IMPAIRMENTS: decreased activity tolerance, decreased ROM, decreased strength, postural dysfunction, and pain.    ACTIVITY LIMITATIONS: carrying, lifting, dressing, and reach over head   PARTICIPATION LIMITATIONS: meal prep, cleaning, and occupation   PERSONAL FACTORS: Profession and Time since onset of injury/illness/exacerbation are also affecting patient's functional outcome.      GOALS: Goals reviewed with patient? Yes   SHORT TERM GOALS: Target date: 08/02/2022   Patient will be I with initial HEP in order to progress with therapy. Baseline: HEP provided at eval Goal status: INITIAL   2.  PT will review FOTO with patient by 3rd visit in order to understand expected progress and outcome with therapy. Baseline: FOTO assessed at eval Goal status: INITIAL   3.  Patient will demonstrate right shoulder motion grossly WFL and equal to opposite without increase in pain in order to improve overhead reach and dressing ability Baseline: see limitations and pain noted above Goal status: INITIAL   LONG TERM GOALS: Target date: 08/30/2022   Patient will be I with final HEP to maintain progress from PT. Baseline: HEP provided at eval Goal status: INITIAL   2.  Patient will report >/= 72% status on FOTO to indicate improved functional ability. Baseline: 57% functional status Goal status: INITIAL   3.  Patient will demonstrate right shoulder strength 5/5 MMT to improve lifting and carrying using the right arm Baseline: see limitations noted above Goal status: INITIAL   4.  Patient will report right shoulder pain with activity </= 2/10 in order to reduce functional limitations Baseline: 6/10 Goal status:  INITIAL    PLAN: PT FREQUENCY: 1-2x/week   PT DURATION: 8 weeks   PLANNED INTERVENTIONS: Therapeutic exercises, Therapeutic activity, Neuromuscular re-education, Balance training, Gait training, Patient/Family education, Self Care, Joint mobilization, Aquatic Therapy, Dry Needling, Cryotherapy, Moist heat, Ionotophoresis /ml Dexamethasone, Manual therapy, and Re-evaluation   PLAN FOR NEXT SESSION: Review HEP and progress PRN, manual/mobilizations for shoulder motion and pain, progress rotator cuff and periscapular strengthening, light stretching for hand behind back   Rosana Hoes, PT, DPT, LAT, ATC 07/20/22  5:04 PM Phone: [phone] Fax: [phone]

## 2022-07-20 ENCOUNTER — Ambulatory Visit: Payer: PRIVATE HEALTH INSURANCE | Attending: Internal Medicine | Admitting: Physical Therapy

## 2022-07-20 ENCOUNTER — Encounter: Payer: Self-pay | Admitting: Physical Therapy

## 2022-07-20 ENCOUNTER — Other Ambulatory Visit: Payer: Self-pay

## 2022-07-20 DIAGNOSIS — M25511 Pain in right shoulder: Secondary | ICD-10-CM | POA: Insufficient documentation

## 2022-07-20 DIAGNOSIS — M6281 Muscle weakness (generalized): Secondary | ICD-10-CM | POA: Diagnosis present

## 2022-07-20 NOTE — Patient Instructions (Signed)
Access Code: [email or web address] URL: [email or web address] Date: 07/20/2022 Prepared by: Rosana Hoes  Exercises - Standing Row with Anchored Resistance  - 1 x daily - 2 sets - 15 reps - Scapular Retraction with Resistance Advanced  - 1 x daily - 2 sets - 15 reps - Shoulder External Rotation with Anchored Resistance  - 1 x daily - 2 sets - 15 reps - Supine Shoulder Horizontal Abduction with Resistance  - 1 x daily - 2 sets - 15 reps

## 2022-07-21 NOTE — Therapy (Signed)
OUTPATIENT PHYSICAL THERAPY TREATMENT NOTE   Patient Name: Mackenzie Clark MRN: [id number] DOB:1965-04-16, 57 y.o., female Today's Date: 07/22/2022   PCP: Dorothyann Peng, MD REFERRING PROVIDER: Lanell Persons, MD   END OF SESSION:   PT End of Session - 07/22/22 1534     Visit Number 3    Number of Visits 17    Date for PT Re-Evaluation 08/30/22    Authorization Type [facility] / [facility] [employer]    Authorization - Visit Number 3    Authorization - Number of Visits 8    PT Start Time 1531    PT Stop Time 1613    PT Time Calculation (min) 42 min    Activity Tolerance Patient tolerated treatment well    Behavior During Therapy WFL for tasks assessed/performed              Past Medical History:  Diagnosis Date   Allergy    Diabetes mellitus without complication    diet and exercise controlled   Hypertension    Past Surgical History:  Procedure Laterality Date   DILATION AND CURETTAGE OF UTERUS  2006   16 week stillbirth   IR ANGIOGRAM PELVIS SELECTIVE OR SUPRASELECTIVE  08/31/2019   IR ANGIOGRAM SELECTIVE EACH ADDITIONAL VESSEL  08/31/2019   IR EMBO TUMOR ORGAN ISCHEMIA INFARCT INC GUIDE ROADMAPPING  08/31/2019   IR FLUORO GUIDED NEEDLE PLC ASPIRATION/INJECTION LOC  08/31/2019   IR RADIOLOGIST EVAL & MGMT  07/10/2019   IR RADIOLOGIST EVAL & MGMT  09/18/2019   IR US GUIDE VASC ACCESS RIGHT  08/31/2019   Patient Active Problem List   Diagnosis Date Noted   Pure hypercholesterolemia 05/06/2022   Overweight with body mass index (BMI) of 29 to 29.9 in adult 01/22/2021   Obesity, diabetes, and hypertension syndrome 01/22/2021   Fibroids 08/31/2019   Abnormal uterine bleeding due to intramural leiomyoma 08/31/2019   Abnormal uterine bleeding (AUB) 05/21/2019   Uterine leiomyoma 05/21/2019   Body mass index 33.0-33.9, adult 09/03/2017   Type II diabetes mellitus, uncontrolled 09/03/2017   Obesity 09/03/2017   Essential hypertension, benign 03/05/2013   Surveillance of previously  prescribed contraceptive pill 03/05/2013    REFERRING DIAG: Contusion of right shoulder   THERAPY DIAG:  Acute pain of right shoulder  Muscle weakness (generalized)  Rationale for Evaluation and Treatment Rehabilitation  PERTINENT HISTORY: None   PRECAUTIONS: None    SUBJECTIVE:                                                                                                                                                                                     SUBJECTIVE STATEMENT:  Patient reports she  is doing well, shoulder is "not bad." She still has trouble laying on the right shoulder.    PAIN:  Are you having pain? Yes:  NPRS scale: 0/10 at rest, 6/10 with using right arm Pain location: Right shoulder Pain description: Aching Aggravating factors: Reach out in front or overhead, reaching behind back, lifting Relieving factors: Medication, rest, heat   OBJECTIVE: (objective measures completed at initial evaluation unless otherwise dated) PATIENT SURVEYS:  FOTO 57% functional status  POSTURE: Rounded shoulder posture   UPPER EXTREMITY ROM:    Active ROM Right eval Left eval Rt 07/22/22  Shoulder flexion 160 160   Shoulder extension       Shoulder abduction 160* 160 160  Shoulder adduction       Shoulder internal rotation T12 T8 T8  Shoulder external rotation 65 / T2 65 / T4 T4  Elbow flexion       Elbow extension       Wrist flexion       Wrist extension       Wrist ulnar deviation       Wrist radial deviation       Wrist pronation       Wrist supination       (Blank rows = not tested)   Painful arc with right shoulder abduction, pain with functional reaches  07/22/2022: patient denies pain with AROM   UPPER EXTREMITY MMT:   MMT Right eval Left eval  Shoulder flexion 4 5  Shoulder extension 5 5  Shoulder abduction 4 5  Shoulder adduction      Shoulder internal rotation 5 5  Shoulder external rotation 4 5  Middle trapezius      Lower trapezius       Elbow flexion 5 5  Elbow extension 5 5  Wrist flexion      Wrist extension      Wrist ulnar deviation      Wrist radial deviation      Wrist pronation      Wrist supination      Grip strength (lbs)      (Blank rows = not tested)   SHOULDER SPECIAL TESTS: Impingement tests: Hawkins/Kennedy impingement test: positive  SLAP lesions: Biceps load test: negative Instability tests: Load and shift test: negative Biceps assessment: Speed's test: positive    JOINT MOBILITY TESTING:  Grossly WFL   PALPATION:  Tender to palpation anterior shoulder, bicep tendon region               TODAY'S TREATMENT:          [facility] Adult PT Treatment:                                                DATE: 07/22/2022 Therapeutic Exercise: UBE L1 x 5 min (fwd/bwd) while taking subjective SMFR using tennis ball for posterior shoulder Sidelying cross body stretch 3 x 20 sec each Supine rhythmic stab at 90 deg flexion and abduction 3 x 30 sec each Supine horizontal abduction with green 2 x 10 Row with green x 15 Extension and scap retraction with green x 15 ER with green x 15 Manual: TPR right infraspinatus   [facility] Adult PT Treatment:  DATE: 07/20/2022 Therapeutic Exercise: NuStep L6 x 5 min with UE/LE while taking subjective Sidelying sleeper and cross body stretch 3 x 20 sec each Supine horizontal abduction with red 2 x 10 Supine serratus punch 2 x 10 Supine shoulder flexion >90 deg with red 2 x 10 Seated doubler ER and scap retraction 2 x 10 Row with green 2 x 10 Extension and scap retraction with green 2 x 10 ER / IR with green 2 x 10 Banded lateral wall taps with red 2 x 10 Standing scaption with 2# and back at wall 2 x 10  [facility] Adult PT Treatment:                                                DATE: 07/05/2022 Therapeutic Exercise: Row with yellow x 15 Extension and scap retraction with yellow x 15 ER with yellow x 15   PATIENT  EDUCATION: Education details: HEP update Person educated: Patient Education method: Explanation, Demonstration, Tactile cues, Verbal cues, Handout Education comprehension: verbalized understanding, returned demonstration, verbal cues required, tactile cues required, and needs further education   HOME EXERCISE PROGRAM: Access Code: [email or web address]      ASSESSMENT: CLINICAL IMPRESSION: Patient tolerated therapy well with no adverse effects. Therapy focused on improving shoulder mobility and progressing shoulder strength with good tolerance. Performed manual to right infraspinatus and instructed patient on SMFR using tennis ball and stretching the posterior shoulder. She does demonstrate improved motion this visit without increased shoulder pain. She did require cueing for proper exercise technique and posture. Updated HEP. Patient would benefit from continued skilled PT to progress shoulder strength and mobility in order to reduce pain and maximize functional ability.     OBJECTIVE IMPAIRMENTS: decreased activity tolerance, decreased ROM, decreased strength, postural dysfunction, and pain.    ACTIVITY LIMITATIONS: carrying, lifting, dressing, and reach over head   PARTICIPATION LIMITATIONS: meal prep, cleaning, and occupation   PERSONAL FACTORS: Profession and Time since onset of injury/illness/exacerbation are also affecting patient's functional outcome.      GOALS: Goals reviewed with patient? Yes   SHORT TERM GOALS: Target date: 08/02/2022   Patient will be I with initial HEP in order to progress with therapy. Baseline: HEP provided at eval 07/22/2022: progressing Goal status: ONGOING   2.  PT will review FOTO with patient by 3rd visit in order to understand expected progress and outcome with therapy. Baseline: FOTO assessed at eval 07/22/2022: reviewed Goal status: MET   3.  Patient will demonstrate right shoulder motion grossly WFL and equal to opposite without increase in pain in  order to improve overhead reach and dressing ability Baseline: see limitations and pain noted above 07/22/2022: see above Goal status: MET   LONG TERM GOALS: Target date: 08/30/2022   Patient will be I with final HEP to maintain progress from PT. Baseline: HEP provided at eval Goal status: INITIAL   2.  Patient will report >/= 72% status on FOTO to indicate improved functional ability. Baseline: 57% functional status Goal status: INITIAL   3.  Patient will demonstrate right shoulder strength 5/5 MMT to improve lifting and carrying using the right arm Baseline: see limitations noted above Goal status: INITIAL   4.  Patient will report right shoulder pain with activity </= 2/10 in order to reduce functional limitations Baseline: 6/10 Goal status: INITIAL    PLAN: PT  FREQUENCY: 1-2x/week   PT DURATION: 8 weeks   PLANNED INTERVENTIONS: Therapeutic exercises, Therapeutic activity, Neuromuscular re-education, Balance training, Gait training, Patient/Family education, Self Care, Joint mobilization, Aquatic Therapy, Dry Needling, Cryotherapy, Moist heat, Ionotophoresis /ml Dexamethasone, Manual therapy, and Re-evaluation   PLAN FOR NEXT SESSION: Review HEP and progress PRN, manual/mobilizations for shoulder motion and pain, progress rotator cuff and periscapular strengthening, light stretching for hand behind back   Rosana Hoes, PT, DPT, LAT, ATC 07/22/22  4:17 PM Phone: [phone] Fax: [phone]

## 2022-07-22 ENCOUNTER — Encounter: Payer: Self-pay | Admitting: Physical Therapy

## 2022-07-22 ENCOUNTER — Ambulatory Visit: Payer: PRIVATE HEALTH INSURANCE | Attending: Internal Medicine | Admitting: Physical Therapy

## 2022-07-22 ENCOUNTER — Other Ambulatory Visit: Payer: Self-pay

## 2022-07-22 DIAGNOSIS — M25511 Pain in right shoulder: Secondary | ICD-10-CM | POA: Diagnosis present

## 2022-07-22 DIAGNOSIS — M6281 Muscle weakness (generalized): Secondary | ICD-10-CM | POA: Diagnosis present

## 2022-07-22 NOTE — Patient Instructions (Signed)
Access Code: [email or web address] URL: [email or web address] Date: 07/22/2022 Prepared by: Rosana Hoes  Exercises - Standing Row with Anchored Resistance  - 1 x daily - 2 sets - 15 reps - Scapular Retraction with Resistance Advanced  - 1 x daily - 2 sets - 15 reps - Shoulder External Rotation with Anchored Resistance  - 1 x daily - 2 sets - 15 reps - Supine Shoulder Horizontal Abduction with Resistance  - 1 x daily - 2 sets - 15 reps - Sidelying Posterior Cuff Cross Body Stretch - 1/4 Turn Back  - 1 x daily - 3 sets - 20 seconds hold - Standing Upper Trapezius Mobilization with Small Newman Pies

## 2022-07-26 NOTE — Therapy (Signed)
OUTPATIENT PHYSICAL THERAPY TREATMENT NOTE   Patient Name: Mackenzie Clark MRN: [id number] DOB:[birth year], 57 y.o., female Today's Date: 07/27/2022   PCP: Dorothyann Peng, MD REFERRING PROVIDER: Lanell Persons, MD   END OF SESSION:   PT End of Session - 07/27/22 1631     Visit Number 4    Number of Visits 17    Date for PT Re-Evaluation 08/30/22    Authorization Type [facility] / [facility] [employer]    Authorization - Visit Number 4    Authorization - Number of Visits 8    PT Start Time 1617    PT Stop Time 1655    PT Time Calculation (min) 38 min    Activity Tolerance Patient tolerated treatment well    Behavior During Therapy WFL for tasks assessed/performed               Past Medical History:  Diagnosis Date   Allergy    Diabetes mellitus without complication    diet and exercise controlled   Hypertension    Past Surgical History:  Procedure Laterality Date   DILATION AND CURETTAGE OF UTERUS  2006   16 week stillbirth   IR ANGIOGRAM PELVIS SELECTIVE OR SUPRASELECTIVE  08/31/2019   IR ANGIOGRAM SELECTIVE EACH ADDITIONAL VESSEL  08/31/2019   IR EMBO TUMOR ORGAN ISCHEMIA INFARCT INC GUIDE ROADMAPPING  08/31/2019   IR FLUORO GUIDED NEEDLE PLC ASPIRATION/INJECTION LOC  08/31/2019   IR RADIOLOGIST EVAL & MGMT  07/10/2019   IR RADIOLOGIST EVAL & MGMT  09/18/2019   IR US GUIDE VASC ACCESS RIGHT  08/31/2019   Patient Active Problem List   Diagnosis Date Noted   Pure hypercholesterolemia 05/06/2022   Overweight with body mass index (BMI) of 29 to 29.9 in adult 01/22/2021   Obesity, diabetes, and hypertension syndrome 01/22/2021   Fibroids 08/31/2019   Abnormal uterine bleeding due to intramural leiomyoma 08/31/2019   Abnormal uterine bleeding (AUB) 05/21/2019   Uterine leiomyoma 05/21/2019   Body mass index 33.0-33.9, adult 09/03/2017   Type II diabetes mellitus, uncontrolled 09/03/2017   Obesity 09/03/2017   Essential hypertension, benign 03/05/2013   Surveillance of previously  prescribed contraceptive pill 03/05/2013    REFERRING DIAG: Contusion of right shoulder   THERAPY DIAG:  Acute pain of right shoulder  Muscle weakness (generalized)  Rationale for Evaluation and Treatment Rehabilitation  PERTINENT HISTORY: None   PRECAUTIONS: None    SUBJECTIVE:                                                                                                                                                                                     SUBJECTIVE STATEMENT:  Patient reports  she is doing well, shoulder is "not bad." She still has trouble laying on the right shoulder.    PAIN:  Are you having pain? Yes:  NPRS scale: 0/10 at rest, 6/10 with using right arm Pain location: Right shoulder Pain description: Aching Aggravating factors: Reach out in front or overhead, reaching behind back, lifting Relieving factors: Medication, rest, heat   OBJECTIVE: (objective measures completed at initial evaluation unless otherwise dated) PATIENT SURVEYS:  FOTO 57% functional status  POSTURE: Rounded shoulder posture   UPPER EXTREMITY ROM:    Active ROM Right eval Left eval Rt 07/22/22  Shoulder flexion 160 160   Shoulder extension       Shoulder abduction 160* 160 160  Shoulder adduction       Shoulder internal rotation T12 T8 T8  Shoulder external rotation 65 / T2 65 / T4 T4  Elbow flexion       Elbow extension       Wrist flexion       Wrist extension       Wrist ulnar deviation       Wrist radial deviation       Wrist pronation       Wrist supination       (Blank rows = not tested)   Painful arc with right shoulder abduction, pain with functional reaches  07/22/2022: patient denies pain with AROM   UPPER EXTREMITY MMT:   MMT Right eval Left eval  Shoulder flexion 4 5  Shoulder extension 5 5  Shoulder abduction 4 5  Shoulder adduction      Shoulder internal rotation 5 5  Shoulder external rotation 4 5  Middle trapezius      Lower trapezius       Elbow flexion 5 5  Elbow extension 5 5  Wrist flexion      Wrist extension      Wrist ulnar deviation      Wrist radial deviation      Wrist pronation      Wrist supination      Grip strength (lbs)      (Blank rows = not tested)   SHOULDER SPECIAL TESTS: Impingement tests: Hawkins/Kennedy impingement test: positive  SLAP lesions: Biceps load test: negative Instability tests: Load and shift test: negative Biceps assessment: Speed's test: positive    JOINT MOBILITY TESTING:  Grossly WFL   PALPATION:  Tender to palpation anterior shoulder, bicep tendon region               TODAY'S TREATMENT:          [facility] Adult PT Treatment:                                                DATE: 07/27/2022 Therapeutic Exercise: UBE L1 x 5 min (fwd/bwd) while taking subjective Sidelying sleeper and cross body stretch 3 x 15 sec each Sidelying thoracic rotation x 10 each Seated upper trap stretch 2 x 20 sec Doorway pec stretch 3 x 15 sec Seated horizontal abduction with yellow 2 x 10 Seated double ER and scap retraction with yellow 2 x 10 Row with green 2 x 15 Extension and scap retraction with red 2 x 15 Supine serratus punch with 2# 2 x 15 Seated scaption with 2# 2 x 10 Manual: TPR  / ART right infraspinatus   [facility] Adult  PT Treatment:                                                DATE: 07/22/2022 Therapeutic Exercise: UBE L1 x 5 min (fwd/bwd) while taking subjective SMFR using tennis ball for posterior shoulder Sidelying cross body stretch 3 x 20 sec each Supine rhythmic stab at 90 deg flexion and abduction 3 x 30 sec each Supine horizontal abduction with green 2 x 10 Row with green x 15 Extension and scap retraction with green x 15 ER with green x 15 Manual: TPR right infraspinatus  [facility] Adult PT Treatment:                                                DATE: 07/20/2022 Therapeutic Exercise: NuStep L6 x 5 min with UE/LE while taking subjective Sidelying sleeper and cross body  stretch 3 x 20 sec each Supine horizontal abduction with red 2 x 10 Supine serratus punch 2 x 10 Supine shoulder flexion >90 deg with red 2 x 10 Seated doubler ER and scap retraction 2 x 10 Row with green 2 x 10 Extension and scap retraction with green 2 x 10 ER / IR with green 2 x 10 Banded lateral wall taps with red 2 x 10 Standing scaption with 2# and back at wall 2 x 10  [facility] Adult PT Treatment:                                                DATE: 07/05/2022 Therapeutic Exercise: Row with yellow x 15 Extension and scap retraction with yellow x 15 ER with yellow x 15   PATIENT EDUCATION: Education details: HEP update Person educated: Patient Education method: Explanation, Demonstration, Tactile cues, Verbal cues, Handout Education comprehension: verbalized understanding, returned demonstration, verbal cues required, tactile cues required, and needs further education   HOME EXERCISE PROGRAM: Access Code: [email or web address]      ASSESSMENT: CLINICAL IMPRESSION: Patient tolerated therapy well with no adverse effects. Therapy continues to focus primarily on improving right posterior cuff muscle tension and progressing rotator cuff and periscapular strengthening. She does report continued right shoulder discomfort with activity and right arm use but states using the tennis ball at home is helpful. She does require cueing for proper exercise technique and posture with exercises. Updated HEP to include upper trap stretching. Patient would benefit from continued skilled PT to progress shoulder strength and mobility in order to reduce pain and maximize functional ability.     OBJECTIVE IMPAIRMENTS: decreased activity tolerance, decreased ROM, decreased strength, postural dysfunction, and pain.    ACTIVITY LIMITATIONS: carrying, lifting, dressing, and reach over head   PARTICIPATION LIMITATIONS: meal prep, cleaning, and occupation   PERSONAL FACTORS: Profession and Time since onset of  injury/illness/exacerbation are also affecting patient's functional outcome.      GOALS: Goals reviewed with patient? Yes   SHORT TERM GOALS: Target date: 08/02/2022   Patient will be I with initial HEP in order to progress with therapy. Baseline: HEP provided at eval 07/22/2022: progressing Goal status: ONGOING   2.  PT will review  FOTO with patient by 3rd visit in order to understand expected progress and outcome with therapy. Baseline: FOTO assessed at eval 07/22/2022: reviewed Goal status: MET   3.  Patient will demonstrate right shoulder motion grossly WFL and equal to opposite without increase in pain in order to improve overhead reach and dressing ability Baseline: see limitations and pain noted above 07/22/2022: see above Goal status: MET   LONG TERM GOALS: Target date: 08/30/2022   Patient will be I with final HEP to maintain progress from PT. Baseline: HEP provided at eval Goal status: INITIAL   2.  Patient will report >/= 72% status on FOTO to indicate improved functional ability. Baseline: 57% functional status Goal status: INITIAL   3.  Patient will demonstrate right shoulder strength 5/5 MMT to improve lifting and carrying using the right arm Baseline: see limitations noted above Goal status: INITIAL   4.  Patient will report right shoulder pain with activity </= 2/10 in order to reduce functional limitations Baseline: 6/10 Goal status: INITIAL    PLAN: PT FREQUENCY: 1-2x/week   PT DURATION: 8 weeks   PLANNED INTERVENTIONS: Therapeutic exercises, Therapeutic activity, Neuromuscular re-education, Balance training, Gait training, Patient/Family education, Self Care, Joint mobilization, Aquatic Therapy, Dry Needling, Cryotherapy, Moist heat, Ionotophoresis 4mg /ml Dexamethasone, Manual therapy, and Re-evaluation   PLAN FOR NEXT SESSION: Review HEP and progress PRN, manual/mobilizations for shoulder motion and pain, progress rotator cuff and periscapular  strengthening, light stretching for hand behind back   Rosana Hoes, PT, DPT, LAT, ATC 07/27/22  5:00 PM Phone: [phone] Fax: [phone]

## 2022-07-27 ENCOUNTER — Other Ambulatory Visit: Payer: Self-pay

## 2022-07-27 ENCOUNTER — Encounter: Payer: Self-pay | Admitting: Physical Therapy

## 2022-07-27 ENCOUNTER — Ambulatory Visit: Payer: PRIVATE HEALTH INSURANCE | Admitting: Physical Therapy

## 2022-07-27 ENCOUNTER — Encounter: Payer: Self-pay | Admitting: Internal Medicine

## 2022-07-27 DIAGNOSIS — M25511 Pain in right shoulder: Secondary | ICD-10-CM

## 2022-07-27 DIAGNOSIS — M6281 Muscle weakness (generalized): Secondary | ICD-10-CM

## 2022-07-27 NOTE — Patient Instructions (Signed)
Access Code: [email or web address] URL: [email or web address] Date: 07/27/2022 Prepared by: Rosana Hoes  Exercises - Standing Row with Anchored Resistance  - 1 x daily - 2 sets - 15 reps - Scapular Retraction with Resistance Advanced  - 1 x daily - 2 sets - 15 reps - Shoulder External Rotation with Anchored Resistance  - 1 x daily - 2 sets - 15 reps - Supine Shoulder Horizontal Abduction with Resistance  - 1 x daily - 2 sets - 15 reps - Sidelying Posterior Cuff Cross Body Stretch - 1/4 Turn Back  - 1 x daily - 3 sets - 20 seconds hold - Seated Cervical Sidebending Stretch  - 3 reps - 15 seconds hold - Standing Upper Trapezius Mobilization with Small Newman Pies

## 2022-07-28 ENCOUNTER — Other Ambulatory Visit (HOSPITAL_COMMUNITY): Payer: Self-pay

## 2022-07-28 ENCOUNTER — Other Ambulatory Visit: Payer: Self-pay | Admitting: Internal Medicine

## 2022-07-28 MED ORDER — ZEPBOUND 5 MG/0.5ML ~~LOC~~ SOAJ
5.0000 mg | SUBCUTANEOUS | 0 refills | Status: DC
  Filled 2022-07-28: qty 2, 28d supply, fill #0

## 2022-07-29 ENCOUNTER — Other Ambulatory Visit (HOSPITAL_COMMUNITY): Payer: Self-pay

## 2022-07-29 ENCOUNTER — Ambulatory Visit: Payer: PRIVATE HEALTH INSURANCE | Admitting: Physical Therapy

## 2022-07-29 ENCOUNTER — Encounter: Payer: Self-pay | Admitting: Physical Therapy

## 2022-07-29 ENCOUNTER — Other Ambulatory Visit: Payer: Self-pay

## 2022-07-29 DIAGNOSIS — M6281 Muscle weakness (generalized): Secondary | ICD-10-CM

## 2022-07-29 DIAGNOSIS — M25511 Pain in right shoulder: Secondary | ICD-10-CM

## 2022-07-29 NOTE — Therapy (Signed)
OUTPATIENT PHYSICAL THERAPY TREATMENT NOTE   Patient Name: Mackenzie Clark MRN: [id number] DOB:25-Jan-1966, 57 y.o., female Today's Date: 07/29/2022   PCP: Dorothyann Peng, MD REFERRING PROVIDER: Lanell Persons, MD   END OF SESSION:   PT End of Session - 07/29/22 0850     Visit Number 5    Number of Visits 17    Date for PT Re-Evaluation 08/30/22    Authorization Type WC / [facility] [employer]    Authorization - Visit Number 5    Authorization - Number of Visits 8    PT Start Time 0849    PT Stop Time 0928    PT Time Calculation (min) 39 min               Past Medical History:  Diagnosis Date   Allergy    Diabetes mellitus without complication    diet and exercise controlled   Hypertension    Past Surgical History:  Procedure Laterality Date   DILATION AND CURETTAGE OF UTERUS  2006   16 week stillbirth   IR ANGIOGRAM PELVIS SELECTIVE OR SUPRASELECTIVE  08/31/2019   IR ANGIOGRAM SELECTIVE EACH ADDITIONAL VESSEL  08/31/2019   IR EMBO TUMOR ORGAN ISCHEMIA INFARCT INC GUIDE ROADMAPPING  08/31/2019   IR FLUORO GUIDED NEEDLE PLC ASPIRATION/INJECTION LOC  08/31/2019   IR RADIOLOGIST EVAL & MGMT  07/10/2019   IR RADIOLOGIST EVAL & MGMT  09/18/2019   IR US GUIDE VASC ACCESS RIGHT  08/31/2019   Patient Active Problem List   Diagnosis Date Noted   Pure hypercholesterolemia 05/06/2022   Overweight with body mass index (BMI) of 29 to 29.9 in adult 01/22/2021   Obesity, diabetes, and hypertension syndrome 01/22/2021   Fibroids 08/31/2019   Abnormal uterine bleeding due to intramural leiomyoma 08/31/2019   Abnormal uterine bleeding (AUB) 05/21/2019   Uterine leiomyoma 05/21/2019   Body mass index 33.0-33.9, adult 09/03/2017   Type II diabetes mellitus, uncontrolled 09/03/2017   Obesity 09/03/2017   Essential hypertension, benign 03/05/2013   Surveillance of previously prescribed contraceptive pill 03/05/2013    REFERRING DIAG: Contusion of right shoulder   THERAPY DIAG:  Acute  pain of right shoulder  Muscle weakness (generalized)  Rationale for Evaluation and Treatment Rehabilitation  PERTINENT HISTORY: None   PRECAUTIONS: None    SUBJECTIVE:                                                                                                                                                                                     SUBJECTIVE STATEMENT:  Patient reports she is sore from the new exercises at last session.    PAIN:  Are you having pain?  Yes:  NPRS scale: 5/10 at rest  Pain location: Right shoulder Pain description: Sore Aggravating factors: Reach out in front or overhead, reaching behind back, lifting Relieving factors: Medication, rest, heat   OBJECTIVE: (objective measures completed at initial evaluation unless otherwise dated) PATIENT SURVEYS:  FOTO 57% functional status  POSTURE: Rounded shoulder posture   UPPER EXTREMITY ROM:    Active ROM Right eval Left eval Rt 07/22/22  Shoulder flexion 160 160   Shoulder extension       Shoulder abduction 160* 160 160  Shoulder adduction       Shoulder internal rotation T12 T8 T8  Shoulder external rotation 65 / T2 65 / T4 T4  Elbow flexion       Elbow extension       Wrist flexion       Wrist extension       Wrist ulnar deviation       Wrist radial deviation       Wrist pronation       Wrist supination       (Blank rows = not tested)   Painful arc with right shoulder abduction, pain with functional reaches  07/22/2022: patient denies pain with AROM   UPPER EXTREMITY MMT:   MMT Right eval Left eval  Shoulder flexion 4 5  Shoulder extension 5 5  Shoulder abduction 4 5  Shoulder adduction      Shoulder internal rotation 5 5  Shoulder external rotation 4 5  Middle trapezius      Lower trapezius      Elbow flexion 5 5  Elbow extension 5 5  Wrist flexion      Wrist extension      Wrist ulnar deviation      Wrist radial deviation      Wrist pronation      Wrist supination       Grip strength (lbs)      (Blank rows = not tested)   SHOULDER SPECIAL TESTS: Impingement tests: Hawkins/Kennedy impingement test: positive  SLAP lesions: Biceps load test: negative Instability tests: Load and shift test: negative Biceps assessment: Speed's test: positive    JOINT MOBILITY TESTING:  Grossly WFL   PALPATION:  Tender to palpation anterior shoulder, bicep tendon region               TODAY'S TREATMENT:          [facility] Adult PT Treatment:                                                DATE: 07/29/22 Therapeutic Exercise: UBE Level 1 5 min as previous Green band row 15 x 2  Green band ext 15 x 2  Pec stretch in doorway  Standing horizontal abduction 10 x 2 Cross body stretch 2 x 15 sec  Seated Scaption 2# 2 x 7 Seated upper trap stretch 3 x 15 sec  Seated scap retract with Er , Red 15 x 2   Supine serratus punch 2# x 15, 3# x 15  Sleeper stretch S/L x 3  Open books x 5 each  Supine yellow band diagonal 10 x 2  Manual Therapy: PROM all planes Modalities:  Iontophoresis with dexamethasone to anterior shoulder     [facility] Adult PT Treatment:  DATE: 07/27/2022 Therapeutic Exercise: UBE L1 x 5 min (fwd/bwd) while taking subjective Sidelying sleeper and cross body stretch 3 x 15 sec each Sidelying thoracic rotation x 10 each Seated upper trap stretch 2 x 20 sec Doorway pec stretch 3 x 15 sec Seated horizontal abduction with yellow 2 x 10 Seated double ER and scap retraction with yellow 2 x 10 Row with green 2 x 15 Extension and scap retraction with red 2 x 15 Supine serratus punch with 2# 2 x 15 Seated scaption with 2# 2 x 10 Manual: TPR  / ART right infraspinatus   [facility] Adult PT Treatment:                                                DATE: 07/22/2022 Therapeutic Exercise: UBE L1 x 5 min (fwd/bwd) while taking subjective SMFR using tennis ball for posterior shoulder Sidelying cross body stretch 3 x 20 sec  each Supine rhythmic stab at 90 deg flexion and abduction 3 x 30 sec each Supine horizontal abduction with green 2 x 10 Row with green x 15 Extension and scap retraction with green x 15 ER with green x 15 Manual: TPR right infraspinatus  [facility] Adult PT Treatment:                                                DATE: 07/20/2022 Therapeutic Exercise: NuStep L6 x 5 min with UE/LE while taking subjective Sidelying sleeper and cross body stretch 3 x 20 sec each Supine horizontal abduction with red 2 x 10 Supine serratus punch 2 x 10 Supine shoulder flexion >90 deg with red 2 x 10 Seated doubler ER and scap retraction 2 x 10 Row with green 2 x 10 Extension and scap retraction with green 2 x 10 ER / IR with green 2 x 10 Banded lateral wall taps with red 2 x 10 Standing scaption with 2# and back at wall 2 x 10  [facility] Adult PT Treatment:                                                DATE: 07/05/2022 Therapeutic Exercise: Row with yellow x 15 Extension and scap retraction with yellow x 15 ER with yellow x 15   PATIENT EDUCATION: Education details: HEP update Person educated: Patient Education method: Explanation, Demonstration, Tactile cues, Verbal cues, Handout Education comprehension: verbalized understanding, returned demonstration, verbal cues required, tactile cues required, and needs further education   HOME EXERCISE PROGRAM: Access Code: [email or web address]      ASSESSMENT: CLINICAL IMPRESSION: Patient tolerated therapy well with no adverse effects. Therapy continues to focus primarily on improving right posterior cuff muscle tension and progressing rotator cuff and periscapular strengthening. She does report continued right shoulder discomfort with activity and right arm use but states using the tennis ball at home is helpful. She does require cueing for proper exercise technique and posture with exercises. Updated HEP to include upper trap stretching. Patient would benefit from continued  skilled PT to progress shoulder strength and mobility in order to reduce pain and maximize functional ability.  OBJECTIVE IMPAIRMENTS: decreased activity tolerance, decreased ROM, decreased strength, postural dysfunction, and pain.    ACTIVITY LIMITATIONS: carrying, lifting, dressing, and reach over head   PARTICIPATION LIMITATIONS: meal prep, cleaning, and occupation   PERSONAL FACTORS: Profession and Time since onset of injury/illness/exacerbation are also affecting patient's functional outcome.      GOALS: Goals reviewed with patient? Yes   SHORT TERM GOALS: Target date: 08/02/2022   Patient will be I with initial HEP in order to progress with therapy. Baseline: HEP provided at eval 07/22/2022: progressing Goal status: ONGOING   2.  PT will review FOTO with patient by 3rd visit in order to understand expected progress and outcome with therapy. Baseline: FOTO assessed at eval 07/22/2022: reviewed Goal status: MET   3.  Patient will demonstrate right shoulder motion grossly WFL and equal to opposite without increase in pain in order to improve overhead reach and dressing ability Baseline: see limitations and pain noted above 07/22/2022: see above Goal status: MET   LONG TERM GOALS: Target date: 08/30/2022   Patient will be I with final HEP to maintain progress from PT. Baseline: HEP provided at eval Goal status: INITIAL   2.  Patient will report >/= 72% status on FOTO to indicate improved functional ability. Baseline: 57% functional status Goal status: INITIAL   3.  Patient will demonstrate right shoulder strength 5/5 MMT to improve lifting and carrying using the right arm Baseline: see limitations noted above Goal status: INITIAL   4.  Patient will report right shoulder pain with activity </= 2/10 in order to reduce functional limitations Baseline: 6/10 Goal status: INITIAL    PLAN: PT FREQUENCY: 1-2x/week   PT DURATION: 8 weeks   PLANNED INTERVENTIONS:  Therapeutic exercises, Therapeutic activity, Neuromuscular re-education, Balance training, Gait training, Patient/Family education, Self Care, Joint mobilization, Aquatic Therapy, Dry Needling, Cryotherapy, Moist heat, Ionotophoresis /ml Dexamethasone, Manual therapy, and Re-evaluation   PLAN FOR NEXT SESSION: Review HEP and progress PRN, manual/mobilizations for shoulder motion and pain, progress rotator cuff and periscapular strengthening, light stretching for hand behind back  Jannette Spanner, PTA 07/29/22 9:40 AM Phone: [phone] Fax: [phone]

## 2022-08-02 ENCOUNTER — Other Ambulatory Visit: Payer: Self-pay | Admitting: Internal Medicine

## 2022-08-02 ENCOUNTER — Other Ambulatory Visit: Payer: Self-pay

## 2022-08-02 ENCOUNTER — Encounter: Payer: Self-pay | Admitting: Internal Medicine

## 2022-08-02 MED ORDER — DIAZEPAM 2 MG PO TABS
ORAL_TABLET | ORAL | 0 refills | Status: DC
  Filled 2022-08-02: qty 2, 1d supply, fill #0
  Filled 2022-08-03: qty 2, 2d supply, fill #0

## 2022-08-03 ENCOUNTER — Other Ambulatory Visit: Payer: Self-pay

## 2022-08-03 ENCOUNTER — Other Ambulatory Visit (HOSPITAL_COMMUNITY): Payer: Self-pay

## 2022-08-03 ENCOUNTER — Ambulatory Visit: Payer: PRIVATE HEALTH INSURANCE | Attending: Internal Medicine | Admitting: Physical Therapy

## 2022-08-03 ENCOUNTER — Encounter: Payer: Self-pay | Admitting: Pharmacist

## 2022-08-03 ENCOUNTER — Encounter: Payer: Self-pay | Admitting: Physical Therapy

## 2022-08-03 DIAGNOSIS — M6281 Muscle weakness (generalized): Secondary | ICD-10-CM | POA: Insufficient documentation

## 2022-08-03 DIAGNOSIS — M25511 Pain in right shoulder: Secondary | ICD-10-CM | POA: Diagnosis present

## 2022-08-03 DIAGNOSIS — E1169 Type 2 diabetes mellitus with other specified complication: Secondary | ICD-10-CM

## 2022-08-03 MED ORDER — MOUNJARO 7.5 MG/0.5ML ~~LOC~~ SOAJ
7.5000 mg | SUBCUTANEOUS | 0 refills | Status: DC
  Filled 2022-08-03: qty 2, 28d supply, fill #0

## 2022-08-03 NOTE — Therapy (Signed)
OUTPATIENT PHYSICAL THERAPY TREATMENT NOTE   Patient Name: Mackenzie Clark MRN: [id number] DOB:03/18/1966, 57 y.o., female Today's Date: 08/03/2022   PCP: Dorothyann Peng, MD REFERRING PROVIDER: Lanell Persons, MD   END OF SESSION:   PT End of Session - 08/03/22 1616     Visit Number 6    Number of Visits 17    Date for PT Re-Evaluation 08/30/22    Authorization Type [facility] / [facility] [employer]    Authorization - Visit Number 6    Authorization - Number of Visits 8    PT Start Time 1615    PT Stop Time 1655    PT Time Calculation (min) 40 min    Activity Tolerance Patient tolerated treatment well    Behavior During Therapy WFL for tasks assessed/performed                Past Medical History:  Diagnosis Date   Allergy    Diabetes mellitus without complication (HCC)    diet and exercise controlled   Hypertension    Past Surgical History:  Procedure Laterality Date   DILATION AND CURETTAGE OF UTERUS  2006   16 week stillbirth   IR ANGIOGRAM PELVIS SELECTIVE OR SUPRASELECTIVE  08/31/2019   IR ANGIOGRAM SELECTIVE EACH ADDITIONAL VESSEL  08/31/2019   IR EMBO TUMOR ORGAN ISCHEMIA INFARCT INC GUIDE ROADMAPPING  08/31/2019   IR FLUORO GUIDED NEEDLE PLC ASPIRATION/INJECTION LOC  08/31/2019   IR RADIOLOGIST EVAL & MGMT  07/10/2019   IR RADIOLOGIST EVAL & MGMT  09/18/2019   IR US GUIDE VASC ACCESS RIGHT  08/31/2019   Patient Active Problem List   Diagnosis Date Noted   Pure hypercholesterolemia 05/06/2022   Overweight with body mass index (BMI) of 29 to 29.9 in adult 01/22/2021   Obesity, diabetes, and hypertension syndrome (HCC) 01/22/2021   Fibroids 08/31/2019   Abnormal uterine bleeding due to intramural leiomyoma 08/31/2019   Abnormal uterine bleeding (AUB) 05/21/2019   Uterine leiomyoma 05/21/2019   Body mass index 33.0-33.9, adult 09/03/2017   Type II diabetes mellitus, uncontrolled 09/03/2017   Obesity 09/03/2017   Essential hypertension, benign 03/05/2013   Surveillance of  previously prescribed contraceptive pill 03/05/2013    REFERRING DIAG: Contusion of right shoulder   THERAPY DIAG:  Acute pain of right shoulder  Muscle weakness (generalized)  Rationale for Evaluation and Treatment Rehabilitation  PERTINENT HISTORY: None   PRECAUTIONS: None    SUBJECTIVE:                                                                                                                                                                                     SUBJECTIVE STATEMENT:  Patient reports her shoulder has been hurting. States the patch did nothing last visit. She had to take an Aleve and use a heat pack today.  PAIN:  Are you having pain? Yes:  NPRS scale: 5/10 Pain location: Right shoulder Pain description: Sore Aggravating factors: Reach out in front or overhead, reaching behind back, lifting Relieving factors: Medication, rest, heat   OBJECTIVE: (objective measures completed at initial evaluation unless otherwise dated) PATIENT SURVEYS:  FOTO 57% functional status  POSTURE: Rounded shoulder posture   UPPER EXTREMITY ROM:    Active ROM Right eval Left eval Rt 07/22/22  Shoulder flexion 160 160   Shoulder extension       Shoulder abduction 160* 160 160  Shoulder adduction       Shoulder internal rotation T12 T8 T8  Shoulder external rotation 65 / T2 65 / T4 T4  Elbow flexion       Elbow extension       Wrist flexion       Wrist extension       Wrist ulnar deviation       Wrist radial deviation       Wrist pronation       Wrist supination       (Blank rows = not tested)   Painful arc with right shoulder abduction, pain with functional reaches  07/22/2022: patient denies pain with AROM   UPPER EXTREMITY MMT:   MMT Right eval Left eval  Shoulder flexion 4 5  Shoulder extension 5 5  Shoulder abduction 4 5  Shoulder adduction      Shoulder internal rotation 5 5  Shoulder external rotation 4 5  Middle trapezius      Lower trapezius       Elbow flexion 5 5  Elbow extension 5 5  Wrist flexion      Wrist extension      Wrist ulnar deviation      Wrist radial deviation      Wrist pronation      Wrist supination      Grip strength (lbs)      (Blank rows = not tested)   SHOULDER SPECIAL TESTS: Impingement tests: Hawkins/Kennedy impingement test: positive  SLAP lesions: Biceps load test: negative Instability tests: Load and shift test: negative Biceps assessment: Speed's test: positive    JOINT MOBILITY TESTING:  Grossly WFL   PALPATION:  Tender to palpation anterior shoulder, bicep tendon region               TODAY'S TREATMENT:          [facility] Adult PT Treatment:                                                DATE: 08/03/22 Therapeutic Exercise: UBE L3 x 4 min (fwd/bwd) while taking subjective Supine shoulder flexion with yellow 3 x 10 Sidelying shoulder Er with 2# 2 x 10 - patient reports shoulder "slippage" Standing reactive shoulder ER isometric with red 10 x 5 sec Row with green x 10 Manual Therapy: TPR to posterior shoulder and upper trap GHJ mobs at various ranges of elevation Scap pinned shoulder stretching PROM all planes   [facility] Adult PT Treatment:  DATE: 07/29/22 Therapeutic Exercise: UBE Level 1 5 min as previous Green band row 15 x 2  Green band ext 15 x 2  Pec stretch in doorway  Standing horizontal abduction 10 x 2 Cross body stretch 2 x 15 sec  Seated Scaption 2# 2 x 7 Seated upper trap stretch 3 x 15 sec  Seated scap retract with Er , Red 15 x 2   Supine serratus punch 2# x 15, 3# x 15  Sleeper stretch S/L x 3  Open books x 5 each  Supine yellow band diagonal 10 x 2  Manual Therapy: PROM all planes Modalities:  Iontophoresis with dexamethasone to anterior shoulder   [facility] Adult PT Treatment:                                                DATE: 07/27/2022 Therapeutic Exercise: UBE L1 x 5 min (fwd/bwd) while taking subjective Sidelying  sleeper and cross body stretch 3 x 15 sec each Sidelying thoracic rotation x 10 each Seated upper trap stretch 2 x 20 sec Doorway pec stretch 3 x 15 sec Seated horizontal abduction with yellow 2 x 10 Seated double ER and scap retraction with yellow 2 x 10 Row with green 2 x 15 Extension and scap retraction with red 2 x 15 Supine serratus punch with 2# 2 x 15 Seated scaption with 2# 2 x 10 Manual: TPR  / ART right infraspinatus  [facility] Adult PT Treatment:                                                DATE: 07/22/2022 Therapeutic Exercise: UBE L1 x 5 min (fwd/bwd) while taking subjective SMFR using tennis ball for posterior shoulder Sidelying cross body stretch 3 x 20 sec each Supine rhythmic stab at 90 deg flexion and abduction 3 x 30 sec each Supine horizontal abduction with green 2 x 10 Row with green x 15 Extension and scap retraction with green x 15 ER with green x 15 Manual: TPR right infraspinatus  [facility] Adult PT Treatment:                                                DATE: 07/20/2022 Therapeutic Exercise: NuStep L6 x 5 min with UE/LE while taking subjective Sidelying sleeper and cross body stretch 3 x 20 sec each Supine horizontal abduction with red 2 x 10 Supine serratus punch 2 x 10 Supine shoulder flexion >90 deg with red 2 x 10 Seated doubler ER and scap retraction 2 x 10 Row with green 2 x 10 Extension and scap retraction with green 2 x 10 ER / IR with green 2 x 10 Banded lateral wall taps with red 2 x 10 Standing scaption with 2# and back at wall 2 x 10   PATIENT EDUCATION: Education details: HEP update Person educated: Patient Education method: Explanation, Demonstration, Tactile cues, Verbal cues, Handout Education comprehension: verbalized understanding, returned demonstration, verbal cues required, tactile cues required, and needs further education   HOME EXERCISE PROGRAM: Access Code: [email or web address]      ASSESSMENT: CLINICAL IMPRESSION: Patient  tolerated  therapy well with no adverse effects. Patient reporting increased pain this visit and "slippage" of right shoulder. She continues to have posterior shoulder tightness with trigger points so performed manual and joint mobs to improve mobility and reduce pain. Exercises focused on progressing shoulder strength and control. Modified shoulder ER exercises to perform isometrically with better tolerance. Updated HEP this visit. Patient would benefit from continued skilled PT to progress shoulder strength and mobility in order to reduce pain and maximize functional ability.     OBJECTIVE IMPAIRMENTS: decreased activity tolerance, decreased ROM, decreased strength, postural dysfunction, and pain.    ACTIVITY LIMITATIONS: carrying, lifting, dressing, and reach over head   PARTICIPATION LIMITATIONS: meal prep, cleaning, and occupation   PERSONAL FACTORS: Profession and Time since onset of injury/illness/exacerbation are also affecting patient's functional outcome.      GOALS: Goals reviewed with patient? Yes   SHORT TERM GOALS: Target date: 08/02/2022   Patient will be I with initial HEP in order to progress with therapy. Baseline: HEP provided at eval 07/22/2022: progressing Goal status: ONGOING   2.  PT will review FOTO with patient by 3rd visit in order to understand expected progress and outcome with therapy. Baseline: FOTO assessed at eval 07/22/2022: reviewed Goal status: MET   3.  Patient will demonstrate right shoulder motion grossly WFL and equal to opposite without increase in pain in order to improve overhead reach and dressing ability Baseline: see limitations and pain noted above 07/22/2022: see above Goal status: MET   LONG TERM GOALS: Target date: 08/30/2022   Patient will be I with final HEP to maintain progress from PT. Baseline: HEP provided at eval Goal status: INITIAL   2.  Patient will report >/= 72% status on FOTO to indicate improved functional ability. Baseline: 57%  functional status Goal status: INITIAL   3.  Patient will demonstrate right shoulder strength 5/5 MMT to improve lifting and carrying using the right arm Baseline: see limitations noted above Goal status: INITIAL   4.  Patient will report right shoulder pain with activity </= 2/10 in order to reduce functional limitations Baseline: 6/10 Goal status: INITIAL    PLAN: PT FREQUENCY: 1-2x/week   PT DURATION: 8 weeks   PLANNED INTERVENTIONS: Therapeutic exercises, Therapeutic activity, Neuromuscular re-education, Balance training, Gait training, Patient/Family education, Self Care, Joint mobilization, Aquatic Therapy, Dry Needling, Cryotherapy, Moist heat, Ionotophoresis 4mg /ml Dexamethasone, Manual therapy, and Re-evaluation   PLAN FOR NEXT SESSION: Review HEP and progress PRN, manual/mobilizations for shoulder motion and pain, progress rotator cuff and periscapular strengthening, light stretching for hand behind back   Rosana Hoes, PT, DPT, LAT, ATC 08/03/22  5:05 PM Phone: [phone] Fax: [phone]

## 2022-08-03 NOTE — Patient Instructions (Signed)
Access Code: [email or web address] URL: [email or web address] Date: 08/03/2022 Prepared by: Rosana Hoes  Exercises - Standing Row with Anchored Resistance  - 1 x daily - 2 sets - 15 reps - Scapular Retraction with Resistance Advanced  - 1 x daily - 2 sets - 15 reps - Shoulder External Rotation Reactive Isometrics  - 1 x daily - 2 sets - 10 reps - 5 seconds hold - Sidelying Posterior Cuff Cross Body Stretch - 1/4 Turn Back  - 1 x daily - 3 sets - 20 seconds hold - Seated Cervical Sidebending Stretch  - 3 reps - 15 seconds hold - Standing Upper Trapezius Mobilization with Small Newman Pies

## 2022-08-04 ENCOUNTER — Other Ambulatory Visit: Payer: Self-pay

## 2022-08-04 NOTE — Therapy (Signed)
OUTPATIENT PHYSICAL THERAPY TREATMENT NOTE   Patient Name: Mackenzie Clark MRN: [id number] DOB:12/22/1965, 57 y.o., female Today's Date: 08/05/2022   PCP: Dorothyann Peng, MD REFERRING PROVIDER: Lanell Persons, MD   END OF SESSION:   PT End of Session - 08/05/22 1537     Visit Number 7    Number of Visits 17    Date for PT Re-Evaluation 08/30/22    Authorization Type [facility] / [facility] [employer]    Authorization - Visit Number 7    Authorization - Number of Visits 8    PT Start Time 1533    PT Stop Time 1615    PT Time Calculation (min) 42 min    Activity Tolerance Patient tolerated treatment well    Behavior During Therapy WFL for tasks assessed/performed                 Past Medical History:  Diagnosis Date   Allergy    Diabetes mellitus without complication (HCC)    diet and exercise controlled   Hypertension    Past Surgical History:  Procedure Laterality Date   DILATION AND CURETTAGE OF UTERUS  2006   16 week stillbirth   IR ANGIOGRAM PELVIS SELECTIVE OR SUPRASELECTIVE  08/31/2019   IR ANGIOGRAM SELECTIVE EACH ADDITIONAL VESSEL  08/31/2019   IR EMBO TUMOR ORGAN ISCHEMIA INFARCT INC GUIDE ROADMAPPING  08/31/2019   IR FLUORO GUIDED NEEDLE PLC ASPIRATION/INJECTION LOC  08/31/2019   IR RADIOLOGIST EVAL & MGMT  07/10/2019   IR RADIOLOGIST EVAL & MGMT  09/18/2019   IR US GUIDE VASC ACCESS RIGHT  08/31/2019   Patient Active Problem List   Diagnosis Date Noted   Pure hypercholesterolemia 05/06/2022   Overweight with body mass index (BMI) of 29 to 29.9 in adult 01/22/2021   Obesity, diabetes, and hypertension syndrome (HCC) 01/22/2021   Fibroids 08/31/2019   Abnormal uterine bleeding due to intramural leiomyoma 08/31/2019   Abnormal uterine bleeding (AUB) 05/21/2019   Uterine leiomyoma 05/21/2019   Body mass index 33.0-33.9, adult 09/03/2017   Type II diabetes mellitus, uncontrolled 09/03/2017   Obesity 09/03/2017   Essential hypertension, benign 03/05/2013   Surveillance  of previously prescribed contraceptive pill 03/05/2013    REFERRING DIAG: Contusion of right shoulder   THERAPY DIAG:  Acute pain of right shoulder  Muscle weakness (generalized)  Rationale for Evaluation and Treatment Rehabilitation  PERTINENT HISTORY: None   PRECAUTIONS: None    SUBJECTIVE:                                                                                                                                                                                     SUBJECTIVE  STATEMENT:  Patient reports she had to use the heat pack after last visit but today it is not hurting as bad as it was.   PAIN:  Are you having pain? Yes:  NPRS scale: 2/10 Pain location: Right shoulder Pain description: Sore Aggravating factors: Reach out in front or overhead, reaching behind back, lifting Relieving factors: Medication, rest, heat   OBJECTIVE: (objective measures completed at initial evaluation unless otherwise dated) PATIENT SURVEYS:  FOTO 57% functional status  POSTURE: Rounded shoulder posture   UPPER EXTREMITY ROM:    Active ROM Right eval Left eval Rt 07/22/22  Shoulder flexion 160 160   Shoulder extension       Shoulder abduction 160* 160 160  Shoulder adduction       Shoulder internal rotation T12 T8 T8  Shoulder external rotation 65 / T2 65 / T4 T4  Elbow flexion       Elbow extension       Wrist flexion       Wrist extension       Wrist ulnar deviation       Wrist radial deviation       Wrist pronation       Wrist supination       (Blank rows = not tested)   Painful arc with right shoulder abduction, pain with functional reaches  07/22/2022: patient denies pain with AROM   UPPER EXTREMITY MMT:   MMT Right eval Left eval Rt 08/05/2022  Shoulder flexion 4 5 4   Shoulder extension 5 5   Shoulder abduction 4 5 4   Shoulder adduction       Shoulder internal rotation 5 5   Shoulder external rotation 4 5 4   Middle trapezius       Lower trapezius        Elbow flexion 5 5   Elbow extension 5 5   Wrist flexion       Wrist extension       Wrist ulnar deviation       Wrist radial deviation       Wrist pronation       Wrist supination       Grip strength (lbs)       (Blank rows = not tested)   SHOULDER SPECIAL TESTS: Impingement tests: Hawkins/Kennedy impingement test: positive  SLAP lesions: Biceps load test: negative Instability tests: Load and shift test: negative Biceps assessment: Speed's test: positive    JOINT MOBILITY TESTING:  Grossly WFL   PALPATION:  Tender to palpation anterior shoulder, bicep tendon region               TODAY'S TREATMENT:          [facility] Adult PT Treatment:                                                DATE: 08/05/22 Therapeutic Exercise: UBE L1 x 4 min (fwd/bwd) while taking subjective Supine shoulder flexion 0-90 deg with 1# with serratus punch 2 x 10 Supine shoulder flexion 90+ deg with yellow 2 x 10 Shoulder isometrics flexion, abduction, ER 10 x 5 sec each Row with green 2 x 10 Standing reactive shoulder ER isometric with red 2 x 10 Seated shoulder scaption with 1# 2 x 10 Seated upper trap and levator scap stretch 2 x 20 sec each Manual Therapy: GHJ mobs  at various ranges of elevation PROM all planes   [facility] Adult PT Treatment:                                                DATE: 08/03/22 Therapeutic Exercise: UBE L3 x 4 min (fwd/bwd) while taking subjective Supine shoulder flexion with yellow 3 x 10 Sidelying shoulder Er with 2# 2 x 10 - patient reports shoulder "slippage" Standing reactive shoulder ER isometric with red 10 x 5 sec Row with green x 10 Manual Therapy: TPR to posterior shoulder and upper trap GHJ mobs at various ranges of elevation Scap pinned shoulder stretching PROM all planes  [facility] Adult PT Treatment:                                                DATE: 07/29/22 Therapeutic Exercise: UBE Level 1 5 min as previous Green band row 15 x 2  Green band ext 15 x 2  Pec  stretch in doorway  Standing horizontal abduction 10 x 2 Cross body stretch 2 x 15 sec  Seated Scaption 2# 2 x 7 Seated upper trap stretch 3 x 15 sec  Seated scap retract with Er , Red 15 x 2   Supine serratus punch 2# x 15, 3# x 15  Sleeper stretch S/L x 3  Open books x 5 each  Supine yellow band diagonal 10 x 2  Manual Therapy: PROM all planes Modalities:  Iontophoresis with dexamethasone to anterior shoulder   [facility] Adult PT Treatment:                                                DATE: 07/27/2022 Therapeutic Exercise: UBE L1 x 5 min (fwd/bwd) while taking subjective Sidelying sleeper and cross body stretch 3 x 15 sec each Sidelying thoracic rotation x 10 each Seated upper trap stretch 2 x 20 sec Doorway pec stretch 3 x 15 sec Seated horizontal abduction with yellow 2 x 10 Seated double ER and scap retraction with yellow 2 x 10 Row with green 2 x 15 Extension and scap retraction with red 2 x 15 Supine serratus punch with 2# 2 x 15 Seated scaption with 2# 2 x 10 Manual: TPR  / ART right infraspinatus  [facility] Adult PT Treatment:                                                DATE: 07/22/2022 Therapeutic Exercise: UBE L1 x 5 min (fwd/bwd) while taking subjective SMFR using tennis ball for posterior shoulder Sidelying cross body stretch 3 x 20 sec each Supine rhythmic stab at 90 deg flexion and abduction 3 x 30 sec each Supine horizontal abduction with green 2 x 10 Row with green x 15 Extension and scap retraction with green x 15 ER with green x 15 Manual: TPR right infraspinatus   PATIENT EDUCATION: Education details: HEP Person educated: Patient Education method: Programmer, multimedia, Facilities manager, Actor cues, Verbal cues Education comprehension:  verbalized understanding, returned demonstration, verbal cues required, tactile cues required, and needs further education   HOME EXERCISE PROGRAM: Access Code: [email or web address]      ASSESSMENT: CLINICAL IMPRESSION: Patient tolerated  therapy well with no adverse effects. She reports improvement in shoulder pain this visit. Therapy focused on progress right shoulder mobility and strengthening this visit. Incorporated more isometrics this visit with good tolerance and patient able to progress to light rotator cuff strengthening without report of increased shoulder pain. She exhibits good range of motion but does continue to exhibit right shoulder strength deficit, and her symptoms remain consistent with rotator cuff related pain vs. bicep tendon pain. No changes made to her HEP this visit. Patient would benefit from continued skilled PT to progress shoulder strength and mobility in order to reduce pain and maximize functional ability.      OBJECTIVE IMPAIRMENTS: decreased activity tolerance, decreased ROM, decreased strength, postural dysfunction, and pain.    ACTIVITY LIMITATIONS: carrying, lifting, dressing, and reach over head   PARTICIPATION LIMITATIONS: meal prep, cleaning, and occupation   PERSONAL FACTORS: Profession and Time since onset of injury/illness/exacerbation are also affecting patient's functional outcome.      GOALS: Goals reviewed with patient? Yes   SHORT TERM GOALS: Target date: 08/02/2022   Patient will be I with initial HEP in order to progress with therapy. Baseline: HEP provided at eval 07/22/2022: progressing 08/05/2022: independent with initial HEP Goal status: MET   2.  PT will review FOTO with patient by 3rd visit in order to understand expected progress and outcome with therapy. Baseline: FOTO assessed at eval 07/22/2022: reviewed Goal status: MET   3.  Patient will demonstrate right shoulder motion grossly WFL and equal to opposite without increase in pain in order to improve overhead reach and dressing ability Baseline: see limitations and pain noted above 07/22/2022: see above Goal status: MET   LONG TERM GOALS: Target date: 08/30/2022   Patient will be I with final HEP to maintain  progress from PT. Baseline: HEP provided at eval Goal status: INITIAL   2.  Patient will report >/= 72% status on FOTO to indicate improved functional ability. Baseline: 57% functional status Goal status: INITIAL   3.  Patient will demonstrate right shoulder strength 5/5 MMT to improve lifting and carrying using the right arm Baseline: see limitations noted above Goal status: INITIAL   4.  Patient will report right shoulder pain with activity </= 2/10 in order to reduce functional limitations Baseline: 6/10 Goal status: INITIAL    PLAN: PT FREQUENCY: 1-2x/week   PT DURATION: 8 weeks   PLANNED INTERVENTIONS: Therapeutic exercises, Therapeutic activity, Neuromuscular re-education, Balance training, Gait training, Patient/Family education, Self Care, Joint mobilization, Aquatic Therapy, Dry Needling, Cryotherapy, Moist heat, Ionotophoresis 4mg /ml Dexamethasone, Manual therapy, and Re-evaluation   PLAN FOR NEXT SESSION: Reassess FOTO, review HEP and progress PRN, manual/mobilizations for shoulder motion and pain, progress rotator cuff and periscapular strengthening   Rosana Hoes, PT, DPT, LAT, ATC 08/05/22  4:21 PM Phone: [phone] Fax: [phone]

## 2022-08-05 ENCOUNTER — Other Ambulatory Visit: Payer: Self-pay

## 2022-08-05 ENCOUNTER — Encounter: Payer: Self-pay | Admitting: Physical Therapy

## 2022-08-05 ENCOUNTER — Ambulatory Visit: Payer: PRIVATE HEALTH INSURANCE | Attending: Internal Medicine | Admitting: Physical Therapy

## 2022-08-05 DIAGNOSIS — M25511 Pain in right shoulder: Secondary | ICD-10-CM | POA: Diagnosis not present

## 2022-08-05 DIAGNOSIS — M6281 Muscle weakness (generalized): Secondary | ICD-10-CM | POA: Diagnosis present

## 2022-08-10 ENCOUNTER — Encounter: Payer: Self-pay | Admitting: Physical Therapy

## 2022-08-10 ENCOUNTER — Ambulatory Visit: Payer: PRIVATE HEALTH INSURANCE | Admitting: Physical Therapy

## 2022-08-10 DIAGNOSIS — M25511 Pain in right shoulder: Secondary | ICD-10-CM | POA: Diagnosis not present

## 2022-08-10 DIAGNOSIS — M6281 Muscle weakness (generalized): Secondary | ICD-10-CM

## 2022-08-10 NOTE — Therapy (Signed)
OUTPATIENT PHYSICAL THERAPY TREATMENT NOTE   Patient Name: Mackenzie Clark MRN: [id number] DOB:05/08/1965, 57 y.o., female Today's Date: 08/10/2022   PCP: Dorothyann Peng, MD REFERRING PROVIDER: Lanell Persons, MD   END OF SESSION:   PT End of Session - 08/10/22 0851     Visit Number 8    Number of Visits 17    Date for PT Re-Evaluation 08/30/22    Authorization Type [facility] / [facility] [employer]    Authorization - Visit Number 7   eval did not count toward visits per [facility] insurance   Authorization - Number of Visits 8    PT Start Time 0848    PT Stop Time 0930    PT Time Calculation (min) 42 min               Past Medical History:  Diagnosis Date   Allergy    Diabetes mellitus without complication (HCC)    diet and exercise controlled   Hypertension    Past Surgical History:  Procedure Laterality Date   DILATION AND CURETTAGE OF UTERUS  2006   16 week stillbirth   IR ANGIOGRAM PELVIS SELECTIVE OR SUPRASELECTIVE  08/31/2019   IR ANGIOGRAM SELECTIVE EACH ADDITIONAL VESSEL  08/31/2019   IR EMBO TUMOR ORGAN ISCHEMIA INFARCT INC GUIDE ROADMAPPING  08/31/2019   IR FLUORO GUIDED NEEDLE PLC ASPIRATION/INJECTION LOC  08/31/2019   IR RADIOLOGIST EVAL & MGMT  07/10/2019   IR RADIOLOGIST EVAL & MGMT  09/18/2019   IR US GUIDE VASC ACCESS RIGHT  08/31/2019   Patient Active Problem List   Diagnosis Date Noted   Pure hypercholesterolemia 05/06/2022   Overweight with body mass index (BMI) of 29 to 29.9 in adult 01/22/2021   Obesity, diabetes, and hypertension syndrome (HCC) 01/22/2021   Fibroids 08/31/2019   Abnormal uterine bleeding due to intramural leiomyoma 08/31/2019   Abnormal uterine bleeding (AUB) 05/21/2019   Uterine leiomyoma 05/21/2019   Body mass index 33.0-33.9, adult 09/03/2017   Type II diabetes mellitus, uncontrolled 09/03/2017   Obesity 09/03/2017   Essential hypertension, benign 03/05/2013   Surveillance of previously prescribed contraceptive pill 03/05/2013     REFERRING DIAG: Contusion of right shoulder   THERAPY DIAG:  Acute pain of right shoulder  Muscle weakness (generalized)  Rationale for Evaluation and Treatment Rehabilitation  PERTINENT HISTORY: None   PRECAUTIONS: None    SUBJECTIVE:                                                                                                                                                                                     SUBJECTIVE STATEMENT:  I went to clean the bathtub and it was  painful up to 9/10.    PAIN:  Are you having pain? Yes:  NPRS scale: 6/10 at rest  Pain location: Right shoulder Pain description: Sore Aggravating factors: Reach out in front or overhead, reaching behind back, lifting Relieving factors: Medication, rest, heat   OBJECTIVE: (objective measures completed at initial evaluation unless otherwise dated) PATIENT SURVEYS:  FOTO 57% functional status FOTO 53% 08/10/22  POSTURE: Rounded shoulder posture   UPPER EXTREMITY ROM:    Active ROM Right eval Left eval Rt 07/22/22 RT 08/10/22  Shoulder flexion 160 160  160  Shoulder extension        Shoulder abduction 160* 160 160 160  Shoulder adduction        Shoulder internal rotation T12 T8 T8 T8  Shoulder external rotation 65 / T2 65 / T4 T4 T4  Elbow flexion        Elbow extension        Wrist flexion        Wrist extension        Wrist ulnar deviation        Wrist radial deviation        Wrist pronation        Wrist supination        (Blank rows = not tested)   Painful arc with right shoulder abduction, pain with functional reaches  07/22/2022: patient denies pain with AROM   UPPER EXTREMITY MMT:   MMT Right eval Left eval Right 08/05/22 Right 08/10/22  Shoulder flexion 4 5 4 4  pain  Shoulder extension 5 5    Shoulder abduction 4 5 4 4   Shoulder adduction        Shoulder internal rotation 5 5    Shoulder external rotation 4 5 4 4   Middle trapezius        Lower trapezius        Elbow  flexion 5 5    Elbow extension 5 5    Wrist flexion        Wrist extension        Wrist ulnar deviation        Wrist radial deviation        Wrist pronation        Wrist supination        Grip strength (lbs)        (Blank rows = not tested)   SHOULDER SPECIAL TESTS: Impingement tests: Hawkins/Kennedy impingement test: positive  SLAP lesions: Biceps load test: negative Instability tests: Load and shift test: negative Biceps assessment: Speed's test: positive    JOINT MOBILITY TESTING:  Grossly WFL   PALPATION:  Tender to palpation anterior shoulder, bicep tendon region               TODAY'S TREATMENT:          [facility] Adult PT Treatment:                                                DATE: 08/10/22 Therapeutic Exercise: UBE Level  Isometric  flexion, ER  Standing [place] red flexion punch x 6 (pain) , ER, IR all 15 reps each without pain  Standing row green band 10 x 2  Standing shoulder ext green band 10 x 2  Seated shoulder scaption with 1# 2 x 10  Supine horizontal abduction  Supine yellow band alternating  diagonals     TODAY'S TREATMENT:          [facility] Adult PT Treatment:                                                DATE: 08/05/22 Therapeutic Exercise: UBE L1 x 4 min (fwd/bwd) while taking subjective Supine shoulder flexion 0-90 deg with 1# with serratus punch 2 x 10 Supine shoulder flexion 90+ deg with yellow 2 x 10 Shoulder isometrics flexion, abduction, ER 10 x 5 sec each Row with green 2 x 10 Standing reactive shoulder ER isometric with red 2 x 10 Seated shoulder scaption with 1# 2 x 10 Seated upper trap and levator scap stretch 2 x 20 sec each Manual Therapy: GHJ mobs at various ranges of elevation PROM all planes     [facility] Adult PT Treatment:                                                DATE: 08/03/22 Therapeutic Exercise: UBE L3 x 4 min (fwd/bwd) while taking subjective Supine shoulder flexion with yellow 3 x 10 Sidelying shoulder Er with 2# 2 x 10 -  patient reports shoulder "slippage" Standing reactive shoulder ER isometric with red 10 x 5 sec Row with green x 10 Manual Therapy: TPR to posterior shoulder and upper trap GHJ mobs at various ranges of elevation Scap pinned shoulder stretching PROM all planes  [facility] Adult PT Treatment:                                                DATE: 07/29/22 Therapeutic Exercise: UBE Level 1 5 min as previous Green band row 15 x 2  Green band ext 15 x 2  Pec stretch in doorway  Standing horizontal abduction 10 x 2 Cross body stretch 2 x 15 sec  Seated Scaption 2# 2 x 7 Seated upper trap stretch 3 x 15 sec  Seated scap retract with Er , Red 15 x 2   Supine serratus punch 2# x 15, 3# x 15  Sleeper stretch S/L x 3  Open books x 5 each  Supine yellow band diagonal 10 x 2  Manual Therapy: PROM all planes Modalities:  Iontophoresis with dexamethasone to anterior shoulder     [facility] Adult PT Treatment:                                                DATE: 07/27/2022 Therapeutic Exercise: UBE L1 x 5 min (fwd/bwd) while taking subjective Sidelying sleeper and cross body stretch 3 x 15 sec each Sidelying thoracic rotation x 10 each Seated upper trap stretch 2 x 20 sec Doorway pec stretch 3 x 15 sec Seated horizontal abduction with yellow 2 x 10 Seated double ER and scap retraction with yellow 2 x 10 Row with green 2 x 15 Extension and scap retraction with red 2 x 15 Supine serratus punch with 2# 2 x 15 Seated scaption  with 2# 2 x 10 Manual: TPR  / ART right infraspinatus   [facility] Adult PT Treatment:                                                DATE: 07/22/2022 Therapeutic Exercise: UBE L1 x 5 min (fwd/bwd) while taking subjective SMFR using tennis ball for posterior shoulder Sidelying cross body stretch 3 x 20 sec each Supine rhythmic stab at 90 deg flexion and abduction 3 x 30 sec each Supine horizontal abduction with green 2 x 10 Row with green x 15 Extension and scap retraction with  green x 15 ER with green x 15 Manual: TPR right infraspinatus     PATIENT EDUCATION: Education details: HEP update Person educated: Patient Education method: Explanation, Demonstration, Tactile cues, Verbal cues, Handout Education comprehension: verbalized understanding, returned demonstration, verbal cues required, tactile cues required, and needs further education   HOME EXERCISE PROGRAM: Access Code: [email or web address]      ASSESSMENT: CLINICAL IMPRESSION: Patient tolerated therapy well with no adverse effects. She reports that she cleaned bottom of bath tub yesterday and experienced anterior shoulder pain. Therapy focused on progression of right shoulder mobility and strengthening this visit. She tolerated light RTC strengthening without increased pain. She tolerated shoulder flexion isometrics without pain however did have pain with added resistance, located at anterior shoulder. She exhibits good range of motion but does continue to exhibit right shoulder strength deficit, and her symptoms remain consistent with rotator cuff related pain vs. bicep tendon pain. No changes made to her HEP this visit. Patient would benefit from continued skilled PT to progress shoulder strength and mobility in order to reduce pain and maximize functional ability. Will request more WC visits next session.      OBJECTIVE IMPAIRMENTS: decreased activity tolerance, decreased ROM, decreased strength, postural dysfunction, and pain.    ACTIVITY LIMITATIONS: carrying, lifting, dressing, and reach over head   PARTICIPATION LIMITATIONS: meal prep, cleaning, and occupation   PERSONAL FACTORS: Profession and Time since onset of injury/illness/exacerbation are also affecting patient's functional outcome.      GOALS: Goals reviewed with patient? Yes   SHORT TERM GOALS: Target date: 08/02/2022   Patient will be I with initial HEP in order to progress with therapy. Baseline: HEP provided at eval 07/22/2022:  progressing 08/10/22: Compliant and independent  Goal status: ONGOING   2.  PT will review FOTO with patient by 3rd visit in order to understand expected progress and outcome with therapy. Baseline: FOTO assessed at eval 07/22/2022: reviewed Goal status: MET   3.  Patient will demonstrate right shoulder motion grossly WFL and equal to opposite without increase in pain in order to improve overhead reach and dressing ability Baseline: see limitations and pain noted above 07/22/2022: see above Goal status: MET   LONG TERM GOALS: Target date: 08/30/2022   Patient will be I with final HEP to maintain progress from PT. Baseline: HEP provided at eval Goal status: ONGOING   2.  Patient will report >/= 72% status on FOTO to indicate improved functional ability. Baseline: 57% functional status 08/10/22: 53%  (decreased) Goal status: ONGOING   3.  Patient will demonstrate right shoulder strength 5/5 MMT to improve lifting and carrying using the right arm Baseline: see limitations noted above 08/10/22: pain with shoulder flexion testing Goal status: ONGOING   4.  Patient will  report right shoulder pain with activity </= 2/10 in order to reduce functional limitations Baseline: 6/10 08/10/22: ranges 6-/10- 9/10 pain (cleaning bath tub)  Goal status: ONGOING    PLAN: PT FREQUENCY: 1-2x/week   PT DURATION: 8 weeks   PLANNED INTERVENTIONS: Therapeutic exercises, Therapeutic activity, Neuromuscular re-education, Balance training, Gait training, Patient/Family education, Self Care, Joint mobilization, Aquatic Therapy, Dry Needling, Cryotherapy, Moist heat, Ionotophoresis 4mg /ml Dexamethasone, Manual therapy, and Re-evaluation   PLAN FOR NEXT SESSION: Review HEP and progress PRN, manual/mobilizations for shoulder motion and pain, progress rotator cuff and periscapular strengthening, light stretching for hand behind back  Jannette Spanner, PTA 08/10/22 12:51 PM Phone: [phone] Fax: [phone]

## 2022-08-12 ENCOUNTER — Other Ambulatory Visit: Payer: Self-pay

## 2022-08-12 ENCOUNTER — Ambulatory Visit: Payer: PRIVATE HEALTH INSURANCE | Admitting: Physical Therapy

## 2022-08-12 ENCOUNTER — Encounter: Payer: Self-pay | Admitting: Physical Therapy

## 2022-08-12 ENCOUNTER — Ambulatory Visit: Payer: 59 | Admitting: Internal Medicine

## 2022-08-12 DIAGNOSIS — M6281 Muscle weakness (generalized): Secondary | ICD-10-CM

## 2022-08-12 DIAGNOSIS — M25511 Pain in right shoulder: Secondary | ICD-10-CM

## 2022-08-12 NOTE — Therapy (Signed)
OUTPATIENT PHYSICAL THERAPY TREATMENT NOTE   Patient Name: Mackenzie Clark MRN: [id number] DOB:October 17, 1965, 57 y.o., female Today's Date: 08/12/2022   PCP: Dorothyann Peng, MD REFERRING PROVIDER: Lanell Persons, MD   END OF SESSION:   PT End of Session - 08/12/22 0849     Visit Number 9    Number of Visits 17    Date for PT Re-Evaluation 08/30/22    Authorization Type [facility] / [facility] [employer]    Authorization - Visit Number 8    Authorization - Number of Visits 12    PT Start Time 0848    PT Stop Time 0930    PT Time Calculation (min) 42 min    Activity Tolerance Patient tolerated treatment well    Behavior During Therapy WFL for tasks assessed/performed                Past Medical History:  Diagnosis Date   Allergy    Diabetes mellitus without complication (HCC)    diet and exercise controlled   Hypertension    Past Surgical History:  Procedure Laterality Date   DILATION AND CURETTAGE OF UTERUS  2006   16 week stillbirth   IR ANGIOGRAM PELVIS SELECTIVE OR SUPRASELECTIVE  08/31/2019   IR ANGIOGRAM SELECTIVE EACH ADDITIONAL VESSEL  08/31/2019   IR EMBO TUMOR ORGAN ISCHEMIA INFARCT INC GUIDE ROADMAPPING  08/31/2019   IR FLUORO GUIDED NEEDLE PLC ASPIRATION/INJECTION LOC  08/31/2019   IR RADIOLOGIST EVAL & MGMT  07/10/2019   IR RADIOLOGIST EVAL & MGMT  09/18/2019   IR US GUIDE VASC ACCESS RIGHT  08/31/2019   Patient Active Problem List   Diagnosis Date Noted   Pure hypercholesterolemia 05/06/2022   Overweight with body mass index (BMI) of 29 to 29.9 in adult 01/22/2021   Obesity, diabetes, and hypertension syndrome (HCC) 01/22/2021   Fibroids 08/31/2019   Abnormal uterine bleeding due to intramural leiomyoma 08/31/2019   Abnormal uterine bleeding (AUB) 05/21/2019   Uterine leiomyoma 05/21/2019   Body mass index 33.0-33.9, adult 09/03/2017   Type II diabetes mellitus, uncontrolled 09/03/2017   Obesity 09/03/2017   Essential hypertension, benign 03/05/2013   Surveillance of  previously prescribed contraceptive pill 03/05/2013    REFERRING DIAG: Contusion of right shoulder   THERAPY DIAG:  Acute pain of right shoulder  Muscle weakness (generalized)  Rationale for Evaluation and Treatment Rehabilitation  PERTINENT HISTORY: None   PRECAUTIONS: None    SUBJECTIVE:                                                                                                                                                                                     SUBJECTIVE STATEMENT:  Patient reports shoulder is still hurting, it has been a bad week. Patient reports the exercises still aggravate her shoulder anytime she has to raise her ups up.     PAIN:  Are you having pain? Yes:  NPRS scale: 6/10 at rest  Pain location: Right shoulder Pain description: Sore Aggravating factors: Reach out in front or overhead, reaching behind back, lifting Relieving factors: Medication, rest, heat   OBJECTIVE: (objective measures completed at initial evaluation unless otherwise dated) PATIENT SURVEYS:  FOTO 57% functional status 08/10/2022: 53%  POSTURE: Rounded shoulder posture   UPPER EXTREMITY ROM:    Active ROM Right eval Left eval Rt 07/22/22 RT 08/10/22  Shoulder flexion 160 160  160  Shoulder extension        Shoulder abduction 160* 160 160 160  Shoulder adduction        Shoulder internal rotation T12 T8 T8 T8  Shoulder external rotation 65 / T2 65 / T4 T4 T4  Elbow flexion        Elbow extension        Wrist flexion        Wrist extension        Wrist ulnar deviation        Wrist radial deviation        Wrist pronation        Wrist supination        (Blank rows = not tested)   Painful arc with right shoulder abduction, pain with functional reaches  07/22/2022: patient denies pain with AROM   UPPER EXTREMITY MMT:   MMT Right eval Left eval Right 08/05/22 Right 08/10/22  Shoulder flexion 4 5 4 4  pain  Shoulder extension 5 5    Shoulder abduction 4 5 4 4    Shoulder adduction        Shoulder internal rotation 5 5    Shoulder external rotation 4 5 4 4   Middle trapezius        Lower trapezius        Elbow flexion 5 5    Elbow extension 5 5    Wrist flexion        Wrist extension        Wrist ulnar deviation        Wrist radial deviation        Wrist pronation        Wrist supination        Grip strength (lbs)        (Blank rows = not tested)   SHOULDER SPECIAL TESTS: Impingement tests: Hawkins/Kennedy impingement test: positive  SLAP lesions: Biceps load test: negative Instability tests: Load and shift test: negative Biceps assessment: Speed's test: positive    JOINT MOBILITY TESTING:  Grossly WFL   PALPATION:  Tender to palpation anterior shoulder, bicep tendon region               TODAY'S TREATMENT:          [facility] Adult PT Treatment:                                                DATE: 08/12/22 Therapeutic Exercise: UBE L1 x 4 min (fwd/bwd) while taking subjective Supine shoulder short arc flexion 70-110 deg with 2# x 20 Supine serratus punch with 2# 2 x 20 Supine rhythmic stabilization at 90 deg flexion 3  x 60 Shoulder isometrics flexion, abduction, extension, IR, ER 10 x 5 sec each Seated upper trap and levator scap stretch 2 x 20 sec each Sidelying thoracic rotation x 10 each   [facility] Adult PT Treatment:                                                DATE: 08/10/22 Therapeutic Exercise: UBE Level  Isometric  flexion, ER  Standing rockwood red flexion punch x 6 (pain) , ER, IR all 15 reps each without pain  Standing row green band 10 x 2  Standing shoulder ext green band 10 x 2  Seated shoulder scaption with 1# 2 x 10  Supine horizontal abduction  Supine yellow band alternating diagonals          [facility] Adult PT Treatment:                                                DATE: 08/05/22 Therapeutic Exercise: UBE L1 x 4 min (fwd/bwd) while taking subjective Supine shoulder flexion 0-90 deg with 1# with serratus punch 2 x  10 Supine shoulder flexion 90+ deg with yellow 2 x 10 Shoulder isometrics flexion, abduction, ER 10 x 5 sec each Row with green 2 x 10 Standing reactive shoulder ER isometric with red 2 x 10 Seated shoulder scaption with 1# 2 x 10 Seated upper trap and levator scap stretch 2 x 20 sec each Manual Therapy: GHJ mobs at various ranges of elevation PROM all planes    PATIENT EDUCATION: Education details: HEP update Person educated: Patient Education method: Explanation, Demonstration, Tactile cues, Verbal cues, Handout Education comprehension: verbalized understanding, returned demonstration, verbal cues required, tactile cues required, and needs further education   HOME EXERCISE PROGRAM: Access Code: [email or web address]      ASSESSMENT: CLINICAL IMPRESSION: Patient tolerated therapy well with no adverse effects. She reports painful arc with snapping and popping sensation with shoulder elevation. Labral tests continue to seem negative but she does report popping and pain that seems to be within the joint, and she does continue to report pain consistent with rotator cuff and biceps tendon. Therapy focused on continued shoulder strengthening primarily using isometrics with better tolerance, but she does continue to report pain of the right shoulder with activity. Updated her HEP to include isometrics. Patient would benefit from continued skilled PT to progress shoulder strength and mobility in order to reduce pain and maximize functional ability.     OBJECTIVE IMPAIRMENTS: decreased activity tolerance, decreased ROM, decreased strength, postural dysfunction, and pain.    ACTIVITY LIMITATIONS: carrying, lifting, dressing, and reach over head   PARTICIPATION LIMITATIONS: meal prep, cleaning, and occupation   PERSONAL FACTORS: Profession and Time since onset of injury/illness/exacerbation are also affecting patient's functional outcome.      GOALS: Goals reviewed with patient? Yes   SHORT TERM  GOALS: Target date: 08/02/2022   Patient will be I with initial HEP in order to progress with therapy. Baseline: HEP provided at eval 07/22/2022: progressing 08/10/22: Compliant and independent  Goal status: ONGOING   2.  PT will review FOTO with patient by 3rd visit in order to understand expected progress and outcome with therapy. Baseline: FOTO assessed at eval 07/22/2022: reviewed  Goal status: MET   3.  Patient will demonstrate right shoulder motion grossly WFL and equal to opposite without increase in pain in order to improve overhead reach and dressing ability Baseline: see limitations and pain noted above 07/22/2022: see above Goal status: MET   LONG TERM GOALS: Target date: 08/30/2022   Patient will be I with final HEP to maintain progress from PT. Baseline: HEP provided at eval Goal status: ONGOING   2.  Patient will report >/= 72% status on FOTO to indicate improved functional ability. Baseline: 57% functional status 08/10/22: 53%  (decreased) Goal status: ONGOING   3.  Patient will demonstrate right shoulder strength 5/5 MMT to improve lifting and carrying using the right arm Baseline: see limitations noted above 08/10/22: pain with shoulder flexion testing Goal status: ONGOING   4.  Patient will report right shoulder pain with activity </= 2/10 in order to reduce functional limitations Baseline: 6/10 08/10/22: ranges 6-/10- 9/10 pain (cleaning bath tub)  Goal status: ONGOING    PLAN: PT FREQUENCY: 1-2x/week   PT DURATION: 8 weeks   PLANNED INTERVENTIONS: Therapeutic exercises, Therapeutic activity, Neuromuscular re-education, Balance training, Gait training, Patient/Family education, Self Care, Joint mobilization, Aquatic Therapy, Dry Needling, Cryotherapy, Moist heat, Ionotophoresis 4mg /ml Dexamethasone, Manual therapy, and Re-evaluation   PLAN FOR NEXT SESSION: Review HEP and progress PRN, manual/mobilizations for shoulder motion and pain, progress rotator cuff and  periscapular strengthening, light stretching for hand behind back   Rosana Hoes, PT, DPT, LAT, ATC 08/12/22  9:33 AM Phone: [phone] Fax: [phone]

## 2022-08-20 DIAGNOSIS — E119 Type 2 diabetes mellitus without complications: Secondary | ICD-10-CM | POA: Diagnosis not present

## 2022-08-20 DIAGNOSIS — H5213 Myopia, bilateral: Secondary | ICD-10-CM | POA: Diagnosis not present

## 2022-08-20 LAB — HM DIABETES EYE EXAM

## 2022-08-23 NOTE — Therapy (Signed)
OUTPATIENT PHYSICAL THERAPY TREATMENT NOTE   Patient Name: Mackenzie Clark MRN: [id number] DOB:Mar 28, 1966, 57 y.o., female Today's Date: 08/24/2022   PCP: Dorothyann Peng, MD REFERRING PROVIDER: Lanell Persons, MD   END OF SESSION:   PT End of Session - 08/24/22 1536     Visit Number 10    Number of Visits 17    Date for PT Re-Evaluation 08/30/22    Authorization Type [facility] / [facility] [employer]    Authorization - Visit Number 9    Authorization - Number of Visits 12    PT Start Time 1531    PT Stop Time 1613    PT Time Calculation (min) 42 min    Activity Tolerance Patient tolerated treatment well    Behavior During Therapy WFL for tasks assessed/performed                 Past Medical History:  Diagnosis Date   Allergy    Diabetes mellitus without complication (HCC)    diet and exercise controlled   Hypertension    Past Surgical History:  Procedure Laterality Date   DILATION AND CURETTAGE OF UTERUS  2006   16 week stillbirth   IR ANGIOGRAM PELVIS SELECTIVE OR SUPRASELECTIVE  08/31/2019   IR ANGIOGRAM SELECTIVE EACH ADDITIONAL VESSEL  08/31/2019   IR EMBO TUMOR ORGAN ISCHEMIA INFARCT INC GUIDE ROADMAPPING  08/31/2019   IR FLUORO GUIDED NEEDLE PLC ASPIRATION/INJECTION LOC  08/31/2019   IR RADIOLOGIST EVAL & MGMT  07/10/2019   IR RADIOLOGIST EVAL & MGMT  09/18/2019   IR US GUIDE VASC ACCESS RIGHT  08/31/2019   Patient Active Problem List   Diagnosis Date Noted   Pure hypercholesterolemia 05/06/2022   Overweight with body mass index (BMI) of 29 to 29.9 in adult 01/22/2021   Obesity, diabetes, and hypertension syndrome (HCC) 01/22/2021   Fibroids 08/31/2019   Abnormal uterine bleeding due to intramural leiomyoma 08/31/2019   Abnormal uterine bleeding (AUB) 05/21/2019   Uterine leiomyoma 05/21/2019   Body mass index 33.0-33.9, adult 09/03/2017   Type II diabetes mellitus, uncontrolled 09/03/2017   Obesity 09/03/2017   Essential hypertension, benign 03/05/2013    Surveillance of previously prescribed contraceptive pill 03/05/2013    REFERRING DIAG: Contusion of right shoulder   THERAPY DIAG:  Acute pain of right shoulder  Muscle weakness (generalized)  Rationale for Evaluation and Treatment Rehabilitation  PERTINENT HISTORY: None   PRECAUTIONS: None    SUBJECTIVE:                                                                                                                                                                                     SUBJECTIVE  STATEMENT:  Patient reports shoulder is still hurting, especially when reach behind her back. She feels like reaching overhead and other motions are ok, but reaching behind back is still very painful and she feels limited in that position. If she does a lot of activity at work with the right arm then her shoulder will start aching. She was referred to orthopedics and is waiting to be scheduled.  PAIN:  Are you having pain? Yes:  NPRS scale: 2/10 Pain location: Right shoulder Pain description: Sore Aggravating factors: Reach out in front or overhead, reaching behind back, lifting Relieving factors: Medication, rest, heat   OBJECTIVE: (objective measures completed at initial evaluation unless otherwise dated) PATIENT SURVEYS:  FOTO 57% functional status 08/10/2022: 53%  POSTURE: Rounded shoulder posture   UPPER EXTREMITY ROM:    Active ROM Right eval Left eval Rt 07/22/22 RT 08/10/22 Rt 08/24/2022  Shoulder flexion 160 160  160 160  Shoulder extension         Shoulder abduction 160* 160 160 160 160  Shoulder adduction         Shoulder internal rotation T12 T8 T8 T8 T8  Shoulder external rotation 65 / T2 65 / T4 T4 T4   Elbow flexion         Elbow extension         Wrist flexion         Wrist extension         Wrist ulnar deviation         Wrist radial deviation         Wrist pronation         Wrist supination         (Blank rows = not tested)   Painful arc with right shoulder  abduction, pain with functional reaches  07/22/2022: patient denies pain with AROM   UPPER EXTREMITY MMT:   MMT Right eval Left eval Right 08/05/22 Right 08/10/22  Shoulder flexion 4 5 4 4  pain  Shoulder extension 5 5    Shoulder abduction 4 5 4 4   Shoulder adduction        Shoulder internal rotation 5 5    Shoulder external rotation 4 5 4 4   Middle trapezius        Lower trapezius        Elbow flexion 5 5    Elbow extension 5 5    Wrist flexion        Wrist extension        Wrist ulnar deviation        Wrist radial deviation        Wrist pronation        Wrist supination        Grip strength (lbs)        (Blank rows = not tested)   SHOULDER SPECIAL TESTS: Impingement tests: Hawkins/Kennedy impingement test: positive  SLAP lesions: Biceps load test: negative Instability tests: Load and shift test: negative Biceps assessment: Speed's test: positive    JOINT MOBILITY TESTING:  Grossly WFL   PALPATION:  Tender to palpation anterior shoulder, bicep tendon region               TODAY'S TREATMENT:          [facility] Adult PT Treatment:  DATE: 08/24/22 Therapeutic Exercise: UBE L1 x 4 min (fwd/bwd) while taking subjective Sleeper stretch on right 3 x 20 sec Sidelying thoracic rotation x 10 each Seated upper trap and levator scap stretch 2 x 20 sec each Shoulder isometrics flexion, ER 10 x 5 sec each Standing shoulder extension with dowel x 10 Standing shoulder IR behind back with dowel pulling across x 10 Row with green x 20 Shoulder ER with green to neutral 2 x 15 Shoulder extension with green x 15 Wall ball circles with red med ball 2 x 10 cw/ccw Wall slide shoulder flexion 2 x 10 Manual: Right GHJ mobs posterior at various ranges Right shoulder PROM   [facility] Adult PT Treatment:                                                DATE: 08/12/22 Therapeutic Exercise: UBE L1 x 4 min (fwd/bwd) while taking subjective Supine shoulder  short arc flexion 70-110 deg with 2# x 20 Supine serratus punch with 2# 2 x 20 Supine rhythmic stabilization at 90 deg flexion 3 x 60 Shoulder isometrics flexion, abduction, extension, IR, ER 10 x 5 sec each Seated upper trap and levator scap stretch 2 x 20 sec each Sidelying thoracic rotation x 10 each  [facility] Adult PT Treatment:                                                DATE: 08/10/22 Therapeutic Exercise: UBE Level  Isometric  flexion, ER  Standing rockwood red flexion punch x 6 (pain) , ER, IR all 15 reps each without pain  Standing row green band 10 x 2  Standing shoulder ext green band 10 x 2  Seated shoulder scaption with 1# 2 x 10  Supine horizontal abduction  Supine yellow band alternating diagonals     PATIENT EDUCATION: Education details: HEP Person educated: Patient Education method: Programmer, multimedia, Facilities manager, Actor cues, Verbal cues Education comprehension: verbalized understanding, returned demonstration, verbal cues required, tactile cues required, and needs further education   HOME EXERCISE PROGRAM: Access Code: [email or web address]      ASSESSMENT: CLINICAL IMPRESSION: Patient tolerated therapy well with no adverse effects. She continues to exhibit overall good range of motion of the shoulder but reports pain with reaching behind her back and continued pain with resisted shoulder elevation. Therapy continues to focus on progressing her shoulder mobility to improve pain free motion and progressing shoulder strength and stability. Her pain does continue to seem consistent with rotator cuff vs bicep but also concern for possible labral involvement given symptomology and persistent pain. No changes made to her HEP this visit. Patient would benefit from continued skilled PT to progress shoulder strength and mobility in order to reduce pain and maximize functional ability.     OBJECTIVE IMPAIRMENTS: decreased activity tolerance, decreased ROM, decreased strength, postural  dysfunction, and pain.    ACTIVITY LIMITATIONS: carrying, lifting, dressing, and reach over head   PARTICIPATION LIMITATIONS: meal prep, cleaning, and occupation   PERSONAL FACTORS: Profession and Time since onset of injury/illness/exacerbation are also affecting patient's functional outcome.      GOALS: Goals reviewed with patient? Yes   SHORT TERM GOALS: Target date: 08/02/2022   Patient will be I  with initial HEP in order to progress with therapy. Baseline: HEP provided at eval 07/22/2022: progressing 08/10/22: Compliant and independent  Goal status: ONGOING   2.  PT will review FOTO with patient by 3rd visit in order to understand expected progress and outcome with therapy. Baseline: FOTO assessed at eval 07/22/2022: reviewed Goal status: MET   3.  Patient will demonstrate right shoulder motion grossly WFL and equal to opposite without increase in pain in order to improve overhead reach and dressing ability Baseline: see limitations and pain noted above 07/22/2022: see above Goal status: MET   LONG TERM GOALS: Target date: 08/30/2022   Patient will be I with final HEP to maintain progress from PT. Baseline: HEP provided at eval Goal status: ONGOING   2.  Patient will report >/= 72% status on FOTO to indicate improved functional ability. Baseline: 57% functional status 08/10/22: 53%  (decreased) Goal status: ONGOING   3.  Patient will demonstrate right shoulder strength 5/5 MMT to improve lifting and carrying using the right arm Baseline: see limitations noted above 08/10/22: pain with shoulder flexion testing Goal status: ONGOING   4.  Patient will report right shoulder pain with activity </= 2/10 in order to reduce functional limitations Baseline: 6/10 08/10/22: ranges 6-/10- 9/10 pain (cleaning bath tub)  Goal status: ONGOING    PLAN: PT FREQUENCY: 1-2x/week   PT DURATION: 8 weeks   PLANNED INTERVENTIONS: Therapeutic exercises, Therapeutic activity, Neuromuscular  re-education, Balance training, Gait training, Patient/Family education, Self Care, Joint mobilization, Aquatic Therapy, Dry Needling, Cryotherapy, Moist heat, Ionotophoresis 4mg /ml Dexamethasone, Manual therapy, and Re-evaluation   PLAN FOR NEXT SESSION: Review HEP and progress PRN, manual/mobilizations for shoulder motion and pain, progress rotator cuff and periscapular strengthening, light stretching for hand behind back   Rosana Hoes, PT, DPT, LAT, ATC 08/24/22  4:17 PM Phone: [phone] Fax: [phone]

## 2022-08-24 ENCOUNTER — Encounter: Payer: Self-pay | Admitting: Physical Therapy

## 2022-08-24 ENCOUNTER — Ambulatory Visit: Payer: PRIVATE HEALTH INSURANCE | Attending: Internal Medicine | Admitting: Physical Therapy

## 2022-08-24 ENCOUNTER — Other Ambulatory Visit: Payer: Self-pay

## 2022-08-24 DIAGNOSIS — M25511 Pain in right shoulder: Secondary | ICD-10-CM | POA: Insufficient documentation

## 2022-08-24 DIAGNOSIS — M6281 Muscle weakness (generalized): Secondary | ICD-10-CM | POA: Insufficient documentation

## 2022-08-24 NOTE — Therapy (Signed)
OUTPATIENT PHYSICAL THERAPY TREATMENT NOTE   Patient Name: Mackenzie Clark MRN: [id number] DOB:Nov 26, 1965, 57 y.o., female Today's Date: 08/26/2022   PCP: Dorothyann Peng, MD REFERRING PROVIDER: Lanell Persons, MD   END OF SESSION:   PT End of Session - 08/26/22 1619     Visit Number 11    Number of Visits 20    Date for PT Re-Evaluation 10/07/22    Authorization Type WC / [facility] [employer]    Authorization - Visit Number 10    Authorization - Number of Visits 12    PT Start Time 1617    PT Stop Time 1700    PT Time Calculation (min) 43 min    Activity Tolerance Patient tolerated treatment well    Behavior During Therapy WFL for tasks assessed/performed                  Past Medical History:  Diagnosis Date   Allergy    Diabetes mellitus without complication (HCC)    diet and exercise controlled   Hypertension    Past Surgical History:  Procedure Laterality Date   DILATION AND CURETTAGE OF UTERUS  2006   16 week stillbirth   IR ANGIOGRAM PELVIS SELECTIVE OR SUPRASELECTIVE  08/31/2019   IR ANGIOGRAM SELECTIVE EACH ADDITIONAL VESSEL  08/31/2019   IR EMBO TUMOR ORGAN ISCHEMIA INFARCT INC GUIDE ROADMAPPING  08/31/2019   IR FLUORO GUIDED NEEDLE PLC ASPIRATION/INJECTION LOC  08/31/2019   IR RADIOLOGIST EVAL & MGMT  07/10/2019   IR RADIOLOGIST EVAL & MGMT  09/18/2019   IR US GUIDE VASC ACCESS RIGHT  08/31/2019   Patient Active Problem List   Diagnosis Date Noted   Pure hypercholesterolemia 05/06/2022   Overweight with body mass index (BMI) of 29 to 29.9 in adult 01/22/2021   Obesity, diabetes, and hypertension syndrome (HCC) 01/22/2021   Fibroids 08/31/2019   Abnormal uterine bleeding due to intramural leiomyoma 08/31/2019   Abnormal uterine bleeding (AUB) 05/21/2019   Uterine leiomyoma 05/21/2019   Body mass index 33.0-33.9, adult 09/03/2017   Type II diabetes mellitus, uncontrolled 09/03/2017   Obesity 09/03/2017   Essential hypertension, benign 03/05/2013    Surveillance of previously prescribed contraceptive pill 03/05/2013    REFERRING DIAG: Contusion of right shoulder   THERAPY DIAG:  Acute pain of right shoulder  Muscle weakness (generalized)  Rationale for Evaluation and Treatment Rehabilitation  PERTINENT HISTORY: None   PRECAUTIONS: None    SUBJECTIVE:  SUBJECTIVE STATEMENT:  Patient reports she is doing alright today. She does continue to endorse shoulder pain when using the right arm especially when reaching behind her back, lifting, or repetitive tasks. She did get an appointment scheduled with ortho on 09/06/2022.  PAIN:  Are you having pain? Yes:  NPRS scale: 2/10 Pain location: Right shoulder Pain description: Sore Aggravating factors: Reach out in front or overhead, reaching behind back, lifting Relieving factors: Medication, rest, heat   OBJECTIVE: (objective measures completed at initial evaluation unless otherwise dated) PATIENT SURVEYS:  FOTO 57% functional status 08/10/2022: 53% 08/26/2022: 64%  POSTURE: Rounded shoulder posture   UPPER EXTREMITY ROM:    Active ROM Right eval Left eval Rt 07/22/22 RT 08/10/22 Rt 08/24/2022  Shoulder flexion 160 160  160 160  Shoulder extension         Shoulder abduction 160* 160 160 160 160  Shoulder adduction         Shoulder internal rotation T12 T8 T8 T8 T8  Shoulder external rotation 65 / T2 65 / T4 T4 T4   Elbow flexion         Elbow extension         Wrist flexion         Wrist extension         Wrist ulnar deviation         Wrist radial deviation         Wrist pronation         Wrist supination         (Blank rows = not tested)   Painful arc with right shoulder abduction, pain with functional reaches  07/22/2022: patient denies pain with AROM   UPPER EXTREMITY MMT:   MMT  Right eval Left eval Right 08/05/22 Right 08/10/22 Rt 08/26/22  Shoulder flexion 4 5 4 4  pain 4+  Shoulder extension 5 5     Shoulder abduction 4 5 4 4  4+  Shoulder adduction         Shoulder internal rotation 5 5     Shoulder external rotation 4 5 4 4  4+  Middle trapezius         Lower trapezius         Elbow flexion 5 5     Elbow extension 5 5     Wrist flexion         Wrist extension         Wrist ulnar deviation         Wrist radial deviation         Wrist pronation         Wrist supination         Grip strength (lbs)         (Blank rows = not tested)   SHOULDER SPECIAL TESTS: Impingement tests: Hawkins/Kennedy impingement test: positive  SLAP lesions: Biceps load test: negative Instability tests: Load and shift test: negative Biceps assessment: Speed's test: positive    JOINT MOBILITY TESTING:  Grossly WFL   PALPATION:  Tender to palpation anterior shoulder, bicep tendon region               TODAY'S TREATMENT:          [facility] Adult PT Treatment:  DATE: 08/26/22 Therapeutic Exercise: UBE L1 x 4 min (fwd/bwd) while taking subjective Sleeper stretch on right 3 x 20 sec Sidelying shoulder abduction with 1# x 10 - manual scapular upward rotation assist Standing shoulder flexion x 10 - manual scapular upward rotation assist Serratus wall slide 2 x 10 Supine shoulder flexion with yellow at wrists 2 x 10 Supine serratus punch with 1# 2 x 15 Sidelying shoulder flexion x 10, with 1# x 10 Manual: Right GHJ mobs posterior at various ranges Right shoulder PROM Scap pinned GHJ stretch at various ranges   [facility] Adult PT Treatment:                                                DATE: 08/24/22 Therapeutic Exercise: UBE L1 x 4 min (fwd/bwd) while taking subjective Sleeper stretch on right 3 x 20 sec Sidelying thoracic rotation x 10 each Seated upper trap and levator scap stretch 2 x 20 sec each Shoulder isometrics flexion, ER 10 x 5  sec each Standing shoulder extension with dowel x 10 Standing shoulder IR behind back with dowel pulling across x 10 Row with green x 20 Shoulder ER with green to neutral 2 x 15 Shoulder extension with green x 15 Wall ball circles with red med ball 2 x 10 cw/ccw Wall slide shoulder flexion 2 x 10 Manual: Right GHJ mobs posterior at various ranges Right shoulder PROM  [facility] Adult PT Treatment:                                                DATE: 08/12/22 Therapeutic Exercise: UBE L1 x 4 min (fwd/bwd) while taking subjective Supine shoulder short arc flexion 70-110 deg with 2# x 20 Supine serratus punch with 2# 2 x 20 Supine rhythmic stabilization at 90 deg flexion 3 x 60 Shoulder isometrics flexion, abduction, extension, IR, ER 10 x 5 sec each Seated upper trap and levator scap stretch 2 x 20 sec each Sidelying thoracic rotation x 10 each  [facility] Adult PT Treatment:                                                DATE: 08/10/22 Therapeutic Exercise: UBE Level  Isometric  flexion, ER  Standing [place] red flexion punch x 6 (pain) , ER, IR all 15 reps each without pain  Standing row green band 10 x 2  Standing shoulder ext green band 10 x 2  Seated shoulder scaption with 1# 2 x 10  Supine horizontal abduction  Supine yellow band alternating diagonals   PATIENT EDUCATION: Education details: POC, HEP update Person educated: Patient Education method: Explanation, Demonstration, Tactile cues, Verbal cues, Handout Education comprehension: verbalized understanding, returned demonstration, verbal cues required, tactile cues required, and needs further education   HOME EXERCISE PROGRAM: Access Code: [email or web address]      ASSESSMENT: CLINICAL IMPRESSION: Patient tolerated therapy well with no adverse effects. She does report improvement in her functional ability but exhibit gross right shoulder strength deficits with pain, and pain at end ranges of shoulder elevation and with reaching behind her  back. She  did note an improvement in her end range pain with shoulder elevation with manual scapular upward rotation assist. Therapy focused on working of improved upward rotation of the scapular and rotator cuff strengthening. Her pain does continue to seem consistent with rotator cuff vs bicep but also concern for possible labral involvement given symptomology and persistent pain. Updated HEP to progress strengthening at home. Patient would benefit from continued skilled PT to progress shoulder strength and mobility in order to reduce pain and maximize functional ability, so will extend PT POC for 6 more weeks.     OBJECTIVE IMPAIRMENTS: decreased activity tolerance, decreased ROM, decreased strength, postural dysfunction, and pain.    ACTIVITY LIMITATIONS: carrying, lifting, dressing, and reach over head   PARTICIPATION LIMITATIONS: meal prep, cleaning, and occupation   PERSONAL FACTORS: Profession and Time since onset of injury/illness/exacerbation are also affecting patient's functional outcome.      GOALS: Goals reviewed with patient? Yes   SHORT TERM GOALS: Target date: 08/02/2022   Patient will be I with initial HEP in order to progress with therapy. Baseline: HEP provided at eval 07/22/2022: progressing 08/10/22: Compliant and independent  Goal status: MET   2.  PT will review FOTO with patient by 3rd visit in order to understand expected progress and outcome with therapy. Baseline: FOTO assessed at eval 07/22/2022: reviewed Goal status: MET   3.  Patient will demonstrate right shoulder motion grossly WFL and equal to opposite without increase in pain in order to improve overhead reach and dressing ability Baseline: see limitations and pain noted above 07/22/2022: see above Goal status: MET   LONG TERM GOALS: Target date: 10/07/2022   Patient will be I with final HEP to maintain progress from PT. Baseline: HEP provided at eval 08/26/2022: progressing Goal status: ONGOING   2.   Patient will report >/= 72% status on FOTO to indicate improved functional ability. Baseline: 57% functional status 08/10/22: 53%  (decreased) 08/26/2022: 64% Goal status: ONGOING   3.  Patient will demonstrate right shoulder strength 5/5 MMT to improve lifting and carrying using the right arm Baseline: see limitations noted above 08/10/22: pain with shoulder flexion testing 08/26/2022: strength deficits noted above Goal status: ONGOING   4.  Patient will report right shoulder pain with activity </= 2/10 in order to reduce functional limitations Baseline: 6/10 08/10/22: ranges 6-/10- 9/10 pain (cleaning bath tub)  08/26/2022: continues to report pain with shoulder activity Goal status: ONGOING    PLAN: PT FREQUENCY: 1-2x/week   PT DURATION: 6 weeks   PLANNED INTERVENTIONS: Therapeutic exercises, Therapeutic activity, Neuromuscular re-education, Balance training, Gait training, Patient/Family education, Self Care, Joint mobilization, Aquatic Therapy, Dry Needling, Cryotherapy, Moist heat, Ionotophoresis 4mg /ml Dexamethasone, Manual therapy, and Re-evaluation   PLAN FOR NEXT SESSION: Review HEP and progress PRN, manual/mobilizations for shoulder motion and pain, progress rotator cuff and periscapular strengthening, light stretching for hand behind back   Rosana Hoes, PT, DPT, LAT, ATC 08/26/22  5:12 PM Phone: [phone] Fax: [phone]

## 2022-08-26 ENCOUNTER — Ambulatory Visit: Payer: PRIVATE HEALTH INSURANCE | Admitting: Physical Therapy

## 2022-08-26 ENCOUNTER — Encounter: Payer: Self-pay | Admitting: Physical Therapy

## 2022-08-26 ENCOUNTER — Other Ambulatory Visit: Payer: Self-pay

## 2022-08-26 DIAGNOSIS — M25511 Pain in right shoulder: Secondary | ICD-10-CM

## 2022-08-26 DIAGNOSIS — M6281 Muscle weakness (generalized): Secondary | ICD-10-CM

## 2022-08-26 NOTE — Patient Instructions (Signed)
Access Code: [email or web address] URL: [email or web address] Date: 08/26/2022 Prepared by: Rosana Hoes  Exercises - Standing Row with Anchored Resistance  - 1 x daily - 2 sets - 15 reps - Scapular Retraction with Resistance Advanced  - 1 x daily - 2 sets - 15 reps - Shoulder External Rotation Reactive Isometrics  - 1 x daily - 2 sets - 10 reps - 5 seconds hold - Sidelying Posterior Cuff Cross Body Stretch - 1/4 Turn Back  - 1 x daily - 3 sets - 20 seconds hold - Seated Cervical Sidebending Stretch  - 3 reps - 15 seconds hold - Standing Upper Trapezius Mobilization with Small Ball  - Isometric Shoulder Flexion at Wall  - 1 x daily - 2 sets - 10 reps - 5 hold - Standing Isometric Shoulder Internal Rotation at Doorway  - 1 x daily - 2 sets - 10 reps - 5 hold - Standing Isometric Shoulder External Rotation with Doorway  - 1 x daily - 2 sets - 10 reps - 5 hold - Serratus Activation at Wall  - 1 x daily - 2 sets - 10 reps - Sidelying Shoulder Flexion 15 Degrees  - 1 x daily - 2 sets - 10 reps

## 2022-08-31 ENCOUNTER — Other Ambulatory Visit (HOSPITAL_COMMUNITY): Payer: Self-pay

## 2022-08-31 ENCOUNTER — Ambulatory Visit: Payer: PRIVATE HEALTH INSURANCE | Admitting: Physical Therapy

## 2022-08-31 ENCOUNTER — Other Ambulatory Visit: Payer: Self-pay | Admitting: Internal Medicine

## 2022-08-31 DIAGNOSIS — E1169 Type 2 diabetes mellitus with other specified complication: Secondary | ICD-10-CM

## 2022-08-31 MED ORDER — MOUNJARO 7.5 MG/0.5ML ~~LOC~~ SOAJ
7.5000 mg | SUBCUTANEOUS | 0 refills | Status: DC
Start: 2022-08-31 — End: 2022-10-04
  Filled 2022-08-31: qty 2, 28d supply, fill #0

## 2022-09-01 ENCOUNTER — Other Ambulatory Visit (HOSPITAL_COMMUNITY): Payer: Self-pay

## 2022-09-01 NOTE — Therapy (Addendum)
OUTPATIENT PHYSICAL THERAPY TREATMENT NOTE  DISCHARGE   Patient Name: Mackenzie Clark MRN: [id number] DOB:22-Aug-1965, 57 y.o., female Today's Date: 09/02/2022   PCP: Dorothyann Peng, MD REFERRING PROVIDER: Lanell Persons, MD   END OF SESSION:   PT End of Session - 09/02/22 1537     Visit Number 12    Number of Visits 20    Date for PT Re-Evaluation 10/07/22    Authorization Type [facility] / [facility] [employer]    Authorization - Visit Number 11    Authorization - Number of Visits 12    PT Start Time 1531    PT Stop Time 1610    PT Time Calculation (min) 39 min    Activity Tolerance Patient tolerated treatment well    Behavior During Therapy WFL for tasks assessed/performed                   Past Medical History:  Diagnosis Date   Allergy    Diabetes mellitus without complication (HCC)    diet and exercise controlled   Hypertension    Past Surgical History:  Procedure Laterality Date   DILATION AND CURETTAGE OF UTERUS  2006   16 week stillbirth   IR ANGIOGRAM PELVIS SELECTIVE OR SUPRASELECTIVE  08/31/2019   IR ANGIOGRAM SELECTIVE EACH ADDITIONAL VESSEL  08/31/2019   IR EMBO TUMOR ORGAN ISCHEMIA INFARCT INC GUIDE ROADMAPPING  08/31/2019   IR FLUORO GUIDED NEEDLE PLC ASPIRATION/INJECTION LOC  08/31/2019   IR RADIOLOGIST EVAL & MGMT  07/10/2019   IR RADIOLOGIST EVAL & MGMT  09/18/2019   IR US GUIDE VASC ACCESS RIGHT  08/31/2019   Patient Active Problem List   Diagnosis Date Noted   Pure hypercholesterolemia 05/06/2022   Overweight with body mass index (BMI) of 29 to 29.9 in adult 01/22/2021   Obesity, diabetes, and hypertension syndrome (HCC) 01/22/2021   Fibroids 08/31/2019   Abnormal uterine bleeding due to intramural leiomyoma 08/31/2019   Abnormal uterine bleeding (AUB) 05/21/2019   Uterine leiomyoma 05/21/2019   Body mass index 33.0-33.9, adult 09/03/2017   Type II diabetes mellitus, uncontrolled 09/03/2017   Obesity 09/03/2017   Essential hypertension, benign  03/05/2013   Surveillance of previously prescribed contraceptive pill 03/05/2013    REFERRING DIAG: Contusion of right shoulder   THERAPY DIAG:  Acute pain of right shoulder  Muscle weakness (generalized)  Rationale for Evaluation and Treatment Rehabilitation  PERTINENT HISTORY: None   PRECAUTIONS: None    SUBJECTIVE:  SUBJECTIVE STATEMENT:  Patient reports shoulder is feeling ok today. States that it did feel better with some rest. She did notice a catch in her shoulder with the wall slide exercise when lowering her arms down.  PAIN:  Are you having pain? NPRS scale: 0/10 Pain location: Right shoulder Pain description: Sore Aggravating factors: Reach out in front or overhead, reaching behind back, lifting Relieving factors: Medication, rest, heat   OBJECTIVE: (objective measures completed at initial evaluation unless otherwise dated) PATIENT SURVEYS:  FOTO 57% functional status 08/10/2022: 53% 08/26/2022: 64%  POSTURE: Rounded shoulder posture   UPPER EXTREMITY ROM:    Active ROM Right eval Left eval Rt 07/22/22 RT 08/10/22 Rt 08/24/2022  Shoulder flexion 160 160  160 160  Shoulder extension         Shoulder abduction 160* 160 160 160 160  Shoulder adduction         Shoulder internal rotation T12 T8 T8 T8 T8  Shoulder external rotation 65 / T2 65 / T4 T4 T4   Elbow flexion         Elbow extension         Wrist flexion         Wrist extension         Wrist ulnar deviation         Wrist radial deviation         Wrist pronation         Wrist supination         (Blank rows = not tested)   Painful arc with right shoulder abduction, pain with functional reaches  07/22/2022: patient denies pain with AROM   UPPER EXTREMITY MMT:   MMT Right eval Left eval Right 08/05/22 Right 08/10/22  Rt 08/26/22  Shoulder flexion 4 5 4 4  pain 4+  Shoulder extension 5 5     Shoulder abduction 4 5 4 4  4+  Shoulder adduction         Shoulder internal rotation 5 5     Shoulder external rotation 4 5 4 4  4+  Middle trapezius         Lower trapezius         Elbow flexion 5 5     Elbow extension 5 5     Wrist flexion         Wrist extension         Wrist ulnar deviation         Wrist radial deviation         Wrist pronation         Wrist supination         Grip strength (lbs)         (Blank rows = not tested)   SHOULDER SPECIAL TESTS: Impingement tests: Hawkins/Kennedy impingement test: positive  SLAP lesions: Biceps load test: negative Instability tests: Load and shift test: negative Biceps assessment: Speed's test: positive    JOINT MOBILITY TESTING:  Grossly WFL   PALPATION:  Tender to palpation anterior shoulder, bicep tendon region               TODAY'S TREATMENT:          [facility] Adult PT Treatment:                                                DATE: 09/02/22  Therapeutic Exercise: UBE L1 x 4 min (fwd/bwd) while taking subjective Supine serratus punch with 1# 2 x 15 Supine shoulder flexion to 90 deg with 1# 2 x 10 Supine rhythmic stabilization at 90 deg flexion 3 x 30 sec Sidelying ER with 1# 2 x 15 Sidelying flexion with 1# 2 x 10 Sidelying horizontal abduction with 1# 2 x 10 Sidelying shoulder flexion with 1# 2 x 10 Row with red 2 x 15 Extension with red 2 x 10 Serratus wall slide with FR 2 x 10 Alternating shoulder taps at wall 2 x 10   [facility] Adult PT Treatment:                                                DATE: 08/26/22 Therapeutic Exercise: UBE L1 x 4 min (fwd/bwd) while taking subjective Sleeper stretch on right 3 x 20 sec Sidelying shoulder abduction with 1# x 10 - manual scapular upward rotation assist Standing shoulder flexion x 10 - manual scapular upward rotation assist Serratus wall slide 2 x 10 Supine shoulder flexion with yellow at wrists 2 x  10 Supine serratus punch with 1# 2 x 15 Sidelying shoulder flexion x 10, with 1# x 10 Manual: Right GHJ mobs posterior at various ranges Right shoulder PROM Scap pinned GHJ stretch at various ranges  [facility] Adult PT Treatment:                                                DATE: 08/24/22 Therapeutic Exercise: UBE L1 x 4 min (fwd/bwd) while taking subjective Sleeper stretch on right 3 x 20 sec Sidelying thoracic rotation x 10 each Seated upper trap and levator scap stretch 2 x 20 sec each Shoulder isometrics flexion, ER 10 x 5 sec each Standing shoulder extension with dowel x 10 Standing shoulder IR behind back with dowel pulling across x 10 Row with green x 20 Shoulder ER with green to neutral 2 x 15 Shoulder extension with green x 15 Wall ball circles with red med ball 2 x 10 cw/ccw Wall slide shoulder flexion 2 x 10 Manual: Right GHJ mobs posterior at various ranges Right shoulder PROM  [facility] Adult PT Treatment:                                                DATE: 08/12/22 Therapeutic Exercise: UBE L1 x 4 min (fwd/bwd) while taking subjective Supine shoulder short arc flexion 70-110 deg with 2# x 20 Supine serratus punch with 2# 2 x 20 Supine rhythmic stabilization at 90 deg flexion 3 x 60 Shoulder isometrics flexion, abduction, extension, IR, ER 10 x 5 sec each Seated upper trap and levator scap stretch 2 x 20 sec each Sidelying thoracic rotation x 10 each  PATIENT EDUCATION: Education details: HEP update Person educated: Patient Education method: Explanation, Demonstration, Tactile cues, Verbal cues, Handout Education comprehension: verbalized understanding, returned demonstration, verbal cues required, tactile cues required, and needs further education   HOME EXERCISE PROGRAM: Access Code: [email or web address]      ASSESSMENT: CLINICAL IMPRESSION: Patient tolerated therapy well with no adverse effects. She  reports pain at 0/10 arriving and increased to 2/10 with therapy. Therapy  focused on light rotator cuff and periscapular strengthening. Incorporated more gravity reduced movements to avoid exacerbating pain.She did not report any catching of the shoulder this visit. Her pain does continue to seem consistent with rotator cuff vs bicep but also concern for possible labral involvement given symptomology and persistent pain. No changes made to HEP this visit. Patient would benefit from continued skilled PT to progress shoulder strength and mobility in order to reduce pain and maximize functional ability.     OBJECTIVE IMPAIRMENTS: decreased activity tolerance, decreased ROM, decreased strength, postural dysfunction, and pain.    ACTIVITY LIMITATIONS: carrying, lifting, dressing, and reach over head   PARTICIPATION LIMITATIONS: meal prep, cleaning, and occupation   PERSONAL FACTORS: Profession and Time since onset of injury/illness/exacerbation are also affecting patient's functional outcome.      GOALS: Goals reviewed with patient? Yes   SHORT TERM GOALS: Target date: 08/02/2022   Patient will be I with initial HEP in order to progress with therapy. Baseline: HEP provided at eval 07/22/2022: progressing 08/10/22: Compliant and independent  Goal status: MET   2.  PT will review FOTO with patient by 3rd visit in order to understand expected progress and outcome with therapy. Baseline: FOTO assessed at eval 07/22/2022: reviewed Goal status: MET   3.  Patient will demonstrate right shoulder motion grossly WFL and equal to opposite without increase in pain in order to improve overhead reach and dressing ability Baseline: see limitations and pain noted above 07/22/2022: see above Goal status: MET   LONG TERM GOALS: Target date: 10/07/2022   Patient will be I with final HEP to maintain progress from PT. Baseline: HEP provided at eval 08/26/2022: progressing Goal status: ONGOING   2.  Patient will report >/= 72% status on FOTO to indicate improved functional  ability. Baseline: 57% functional status 08/10/22: 53%  (decreased) 08/26/2022: 64% Goal status: ONGOING   3.  Patient will demonstrate right shoulder strength 5/5 MMT to improve lifting and carrying using the right arm Baseline: see limitations noted above 08/10/22: pain with shoulder flexion testing 08/26/2022: strength deficits noted above Goal status: ONGOING   4.  Patient will report right shoulder pain with activity </= 2/10 in order to reduce functional limitations Baseline: 6/10 08/10/22: ranges 6-/10- 9/10 pain (cleaning bath tub)  08/26/2022: continues to report pain with shoulder activity Goal status: ONGOING    PLAN: PT FREQUENCY: 1-2x/week   PT DURATION: 6 weeks   PLANNED INTERVENTIONS: Therapeutic exercises, Therapeutic activity, Neuromuscular re-education, Balance training, Gait training, Patient/Family education, Self Care, Joint mobilization, Aquatic Therapy, Dry Needling, Cryotherapy, Moist heat, Ionotophoresis 4mg /ml Dexamethasone, Manual therapy, and Re-evaluation   PLAN FOR NEXT SESSION: Review HEP and progress PRN, manual/mobilizations for shoulder motion and pain, progress rotator cuff and periscapular strengthening, light stretching for hand behind back   Rosana Hoes, PT, DPT, LAT, ATC 09/02/22  4:16 PM Phone: [phone] Fax: [phone]     PHYSICAL THERAPY DISCHARGE SUMMARY  Visits from Start of Care: 12  Current functional level related to goals / functional outcomes: See above   Remaining deficits: See above   Education / Equipment: HEP   Patient agrees to discharge. Patient goals were partially met. Patient is being discharged due to not returning since the last visit.  Rosana Hoes, PT, DPT, LAT, ATC 10/14/22  12:07 PM Phone: [phone] Fax: [phone]

## 2022-09-02 ENCOUNTER — Ambulatory Visit: Payer: PRIVATE HEALTH INSURANCE | Attending: Internal Medicine | Admitting: Physical Therapy

## 2022-09-02 ENCOUNTER — Other Ambulatory Visit: Payer: Self-pay

## 2022-09-02 ENCOUNTER — Encounter: Payer: Self-pay | Admitting: Physical Therapy

## 2022-09-02 DIAGNOSIS — M6281 Muscle weakness (generalized): Secondary | ICD-10-CM | POA: Diagnosis present

## 2022-09-02 DIAGNOSIS — M25511 Pain in right shoulder: Secondary | ICD-10-CM | POA: Insufficient documentation

## 2022-09-03 ENCOUNTER — Other Ambulatory Visit (HOSPITAL_COMMUNITY): Payer: Self-pay

## 2022-09-06 ENCOUNTER — Ambulatory Visit: Payer: Self-pay | Admitting: Physical Therapy

## 2022-09-06 ENCOUNTER — Other Ambulatory Visit (HOSPITAL_COMMUNITY): Payer: Self-pay | Admitting: Orthopedic Surgery

## 2022-09-06 DIAGNOSIS — M25511 Pain in right shoulder: Secondary | ICD-10-CM

## 2022-09-07 ENCOUNTER — Encounter: Payer: Self-pay | Admitting: Internal Medicine

## 2022-09-07 ENCOUNTER — Ambulatory Visit: Payer: Commercial Managed Care - PPO | Attending: Cardiology | Admitting: Cardiology

## 2022-09-07 ENCOUNTER — Ambulatory Visit: Payer: Commercial Managed Care - PPO | Admitting: Internal Medicine

## 2022-09-07 ENCOUNTER — Encounter: Payer: Self-pay | Admitting: Cardiology

## 2022-09-07 VITALS — BP 132/86 | HR 62 | Temp 97.6°F | Ht 67.0 in | Wt 201.0 lb

## 2022-09-07 VITALS — BP 142/86 | Ht 67.0 in | Wt 202.2 lb

## 2022-09-07 DIAGNOSIS — E559 Vitamin D deficiency, unspecified: Secondary | ICD-10-CM

## 2022-09-07 DIAGNOSIS — I779 Disorder of arteries and arterioles, unspecified: Secondary | ICD-10-CM

## 2022-09-07 DIAGNOSIS — E785 Hyperlipidemia, unspecified: Secondary | ICD-10-CM

## 2022-09-07 DIAGNOSIS — E1100 Type 2 diabetes mellitus with hyperosmolarity without nonketotic hyperglycemic-hyperosmolar coma (NKHHC): Secondary | ICD-10-CM

## 2022-09-07 DIAGNOSIS — I10 Essential (primary) hypertension: Secondary | ICD-10-CM | POA: Diagnosis not present

## 2022-09-07 DIAGNOSIS — E6609 Other obesity due to excess calories: Secondary | ICD-10-CM | POA: Diagnosis not present

## 2022-09-07 DIAGNOSIS — Z6831 Body mass index (BMI) 31.0-31.9, adult: Secondary | ICD-10-CM

## 2022-09-07 DIAGNOSIS — E1169 Type 2 diabetes mellitus with other specified complication: Secondary | ICD-10-CM

## 2022-09-07 DIAGNOSIS — L918 Other hypertrophic disorders of the skin: Secondary | ICD-10-CM

## 2022-09-07 DIAGNOSIS — N926 Irregular menstruation, unspecified: Secondary | ICD-10-CM | POA: Diagnosis not present

## 2022-09-07 DIAGNOSIS — Z7985 Long-term (current) use of injectable non-insulin antidiabetic drugs: Secondary | ICD-10-CM | POA: Diagnosis not present

## 2022-09-07 NOTE — Progress Notes (Signed)
I,[name],acting as a scribe for [name], MD.,have documented all relevant documentation on the behalf of [name], MD,as directed by  [name], MD while in the presence of [name], MD.    Subjective:     Patient ID: Mackenzie Clark , female    DOB: 1965/10/17 , 57 y.o.   MRN: [id number]   Chief Complaint  Patient presents with   Diabetes    HPI  Pt presents today for DM/weight check. She reports compliance with Mounjaro 7.5mg  weekly. She denies having any specific questions or concerns at this time.   She adds, MRI for her right shoulder scheduled for 6/14. This is part of Workmen's comp case.   Diabetes She presents for her follow-up diabetic visit. She has type 2 diabetes mellitus. Her disease course has been stable. There are no hypoglycemic associated symptoms. Pertinent negatives for diabetes include no blurred vision and no chest pain. There are no hypoglycemic complications. Risk factors for coronary artery disease include diabetes mellitus, hypertension and obesity. She is compliant with treatment most of the time. She participates in exercise intermittently. An ACE inhibitor/angiotensin II receptor blocker is being taken. Eye exam is current.  Hypertension This is a chronic problem. The current episode started more than 1 year ago. The problem has been gradually improving since onset. The problem is controlled. Pertinent negatives include no blurred vision or chest pain.     Past Medical History:  Diagnosis Date   Allergy    Diabetes mellitus without complication (HCC)    diet and exercise controlled   Hypertension      Family History  Problem Relation Age of Onset   Hypertension Mother    Hyperlipidemia Mother    Hypertension Father    Diabetes Father    Heart disease Father        heart attack age 56   Hypertension Brother    Cancer Maternal Grandmother 74       Breast   Obesity Other    Colon cancer Neg Hx    Colon  polyps Neg Hx    Esophageal cancer Neg Hx    Rectal cancer Neg Hx    Stomach cancer Neg Hx      Current Outpatient Medications:    amLODipine (NORVASC) 10 MG tablet, Take 1 tablet (10 mg total) by mouth daily., Disp: 90 tablet, Rfl: 2   atorvastatin (LIPITOR) 20 MG tablet, Take 1 tablet (20 mg total) by mouth 3 (three) times a week on MWF., Disp: 30 tablet, Rfl: 11   diazepam (VALIUM) 2 MG tablet, Take one tablet by mouth 1 hour prior to procedure, repeat as needed, Disp: 2 tablet, Rfl: 0   Ergocalciferol 50 MCG (2000 UT) CAPS, , Disp: , Rfl:    fluticasone (FLONASE) 50 MCG/ACT nasal spray, Place 2 sprays into both nostrils daily., Disp: 54 g, Rfl: 2   glucose blood test strip, 1 each by Other route 2 (two) times daily. Use as instructed, Disp: , Rfl:    olmesartan-hydrochlorothiazide (BENICAR HCT) 40-25 MG tablet, Take 1 tablet by mouth daily., Disp: 90 tablet, Rfl: 1   tirzepatide (MOUNJARO) 7.5 MG/0.5ML Pen, Inject 7.5 mg into the skin once a week., Disp: 2 mL, Rfl: 0   No Known Allergies   Review of Systems  Constitutional: Negative.   Eyes:  Negative for blurred vision.  Respiratory: Negative.    Cardiovascular: Negative.  Negative for chest pain.  Gastrointestinal: Negative.  Musculoskeletal: Negative.   Skin: Negative.        She wants to have skin tags removed. She has more than one that needs to be removed.   Neurological: Negative.   Psychiatric/Behavioral: Negative.       Today's Vitals   09/07/22 1613  BP: 132/86  Pulse: 62  Temp: 97.6 F (36.4 C)  SpO2: 98%  Weight: 201 lb (91.2 kg)  Height: 5\' 7"  (1.702 m)   Wt Readings from Last 3 Encounters:  09/07/22 201 lb (91.2 kg)  09/07/22 202 lb 3.2 oz (91.7 kg)  07/06/22 200 lb 12.8 oz (91.1 kg)    Body mass index is 31.48 kg/m.  The 10-year ASCVD risk score (Arnett DK, et al., 2019) is: 8.5%   Values used to calculate the score:     Age: 52 years     Sex: Female     Is Non-Hispanic African American: Yes      Diabetic: Yes     Tobacco smoker: No     Systolic Blood Pressure: 132 mmHg     Is BP treated: Yes     HDL Cholesterol: 86 mg/dL     Total Cholesterol: 228 mg/dL ++ Objective:  Physical Exam Vitals and nursing note reviewed.  Constitutional:      Appearance: Normal appearance.  HENT:     Head: Normocephalic and atraumatic.  Eyes:     Extraocular Movements: Extraocular movements intact.  Cardiovascular:     Rate and Rhythm: Normal rate and regular rhythm.     Heart sounds: Normal heart sounds.  Pulmonary:     Effort: Pulmonary effort is normal.     Breath sounds: Normal breath sounds.  Musculoskeletal:     Cervical back: Normal range of motion.  Skin:    General: Skin is warm.  Neurological:     General: No focal deficit present.     Mental Status: She is alert.  Psychiatric:        Mood and Affect: Mood normal.        Behavior: Behavior normal.      Assessment And Plan:     1. Dyslipidemia associated with type 2 diabetes mellitus (HCC) Comments: Chronic, she is due for physical in July 2024. I will put in future orders and check a1c at that time. She will c/w Mounjaro 7.5mg  weekly. Agrees to cardiac CT. - Urine microalbumin-creatinine with uACR - CMP14+EGFR; Future - CBC; Future - Lipid panel; Future - Hemoglobin A1c; Future  2. Skin tag Comments: She has several tags she would like removed. I will refer her to Derm for further evaluation/treatment. - Ambulatory referral to Dermatology  3. Vitamin D deficiency disease Comments: I will check a vitamin D level and supplement as needed. - Vitamin D (25 hydroxy); Future  4. Irregular menses Comments: She is s/p uterine artery embolization.  She has been experiencing some spotting.  I will check [facility] prior to her physical in July 2024. [facility]; Future  5. Class 1 obesity due to excess calories with serious comorbidity and body mass index (BMI) of 31.0 to 31.9 in adult Comments: She is encouraged to aim for at least  150 minutes of exercise/week.  Return if symptoms worsen or fail to improve.  Patient was given opportunity to ask questions. Patient verbalized understanding of the plan and was able to repeat key elements of the plan. All questions were answered to their satisfaction.   I, [name], MD, have reviewed all documentation for  this visit. The documentation on 09/07/22 for the exam, diagnosis, procedures, and orders are all accurate and complete.   IF YOU HAVE BEEN REFERRED TO A SPECIALIST, IT MAY TAKE 1-2 WEEKS TO SCHEDULE/PROCESS THE REFERRAL. IF YOU HAVE NOT HEARD FROM US/SPECIALIST IN TWO WEEKS, PLEASE GIVE [place] A CALL AT [phone].   THE PATIENT IS ENCOURAGED TO PRACTICE SOCIAL DISTANCING DUE TO THE COVID-19 PANDEMIC.

## 2022-09-07 NOTE — Patient Instructions (Addendum)
Medication Instructions:  TAKE LIPITOR AT NIGHT WITH AT LEAST 6ONCES OF WATER AND ADD COQ 10. SEND DR. TOBB UPDATES VIA MYCHART IN ONE MONTH  *If you need a refill on your cardiac medications before your next appointment, please call your pharmacy*  Testing/Procedures: Your physician has requested that you have a carotid duplex. This test is an ultrasound of the carotid arteries in your neck. It looks at blood flow through these arteries that supply the brain with blood. Allow one hour for this exam. There are no restrictions or special instructions.   Follow-Up: At [facility], you and your health needs are our priority.  As part of our continuing mission to provide you with exceptional heart care, we have created designated Provider Care Teams.  These Care Teams include your primary Cardiologist (physician) and Advanced Practice Providers (APPs -  Physician Assistants and Nurse Practitioners) who all work together to provide you with the care you need, when you need it.  We recommend signing up for the patient portal called "MyChart".  Sign up information is provided on this After Visit Summary.  MyChart is used to connect with patients for Virtual Visits (Telemedicine).  Patients are able to view lab/test results, encounter notes, upcoming appointments, etc.  Non-urgent messages can be sent to your provider as well.   To learn more about what you can do with MyChart, go to [email or web address].    Your next appointment:   4 month(s)  Provider:   Thomasene Ripple, DO

## 2022-09-07 NOTE — Patient Instructions (Signed)

## 2022-09-07 NOTE — Progress Notes (Signed)
Cardiology Office Note:    Date:  09/07/2022   ID:  Benita Gutter, DOB 01/07/1966, MRN [id number]  PCP:  Dorothyann Peng, MD  Cardiologist:  Thomasene Ripple, DO  Electrophysiologist:  None   Referring MD: Dorothyann Peng, MD   " I am doing ok"  History of Present Illness:    Judyann Ricchio is a 57 y.o. female with a hx of Type 2 diabetes mellitus, hypertension and hyperlipidemia. She reports that she has been taking her Lipitor 3 days a week due to muscle pain. She reports no improvements - she referred by her pcp to discuss other lipid lowering agents for working on LDL goal less than 70.  No chest pain or shortness of breath.  Past Medical History:  Diagnosis Date   Allergy    Diabetes mellitus without complication (HCC)    diet and exercise controlled   Hypertension     Past Surgical History:  Procedure Laterality Date   DILATION AND CURETTAGE OF UTERUS  2006   16 week stillbirth   IR ANGIOGRAM PELVIS SELECTIVE OR SUPRASELECTIVE  08/31/2019   IR ANGIOGRAM SELECTIVE EACH ADDITIONAL VESSEL  08/31/2019   IR EMBO TUMOR ORGAN ISCHEMIA INFARCT INC GUIDE ROADMAPPING  08/31/2019   IR FLUORO GUIDED NEEDLE PLC ASPIRATION/INJECTION LOC  08/31/2019   IR RADIOLOGIST EVAL & MGMT  07/10/2019   IR RADIOLOGIST EVAL & MGMT  09/18/2019   IR US GUIDE VASC ACCESS RIGHT  08/31/2019    Current Medications: Current Meds  Medication Sig   amLODipine (NORVASC) 10 MG tablet Take 1 tablet (10 mg total) by mouth daily.   atorvastatin (LIPITOR) 20 MG tablet Take 1 tablet (20 mg total) by mouth 3 (three) times a week on MWF.   diazepam (VALIUM) 2 MG tablet Take one tablet by mouth 1 hour prior to procedure, repeat as needed   Ergocalciferol 50 MCG (2000 UT) CAPS    glucose blood test strip 1 each by Other route 2 (two) times daily. Use as instructed   olmesartan-hydrochlorothiazide (BENICAR HCT) 40-25 MG tablet Take 1 tablet by mouth daily.   tirzepatide (MOUNJARO) 7.5 MG/0.5ML Pen Inject 7.5 mg into the  skin once a week.     Allergies:   Patient has no known allergies.   Social History   Socioeconomic History   Marital status: Divorced    Spouse name: Not on file   Number of children: Not on file   Years of education: Not on file   Highest education level: Associate degree: occupational, Scientist, product/process development, or vocational program  Occupational History   Occupation: CMA    Employer: [employer]    Comment: [employer]  Tobacco Use   Smoking status: Never   Smokeless tobacco: Never  Vaping Use   Vaping Use: Never used  Substance and Sexual Activity   Alcohol use: Yes    Comment: occasionally   Drug use: No   Sexual activity: Yes    Partners: Male    Birth control/protection: OCP, Pill  Other Topics Concern   Not on file  Social History Narrative   Not on file   Social Determinants of Health   Financial Resource Strain: Low Risk  (07/05/2022)   Overall Financial Resource Strain (CARDIA)    Difficulty of Paying Living Expenses: Not hard at all  Food Insecurity: No Food Insecurity (07/05/2022)   Hunger Vital Sign    Worried About Running Out of Food in the Last Year: Never true    Ran Out  of Food in the Last Year: Never true  Transportation Needs: No Transportation Needs (07/05/2022)   PRAPARE - Administrator, Civil Service (Medical): No    Lack of Transportation (Non-Medical): No  Physical Activity: Insufficiently Active (07/05/2022)   Exercise Vital Sign    Days of Exercise per Week: 3 days    Minutes of Exercise per Session: 30 min  Stress: No Stress Concern Present (07/05/2022)   [employer] of Occupational Health - Occupational Stress Questionnaire    Feeling of Stress : Not at all  Social Connections: Moderately Integrated (07/05/2022)   Social Connection and Isolation Panel [NHANES]    Frequency of Communication with Friends and Family: More than three times a week    Frequency of Social Gatherings with Friends and Family: Once a week    Attends  Religious Services: More than 4 times per year    Active Member of [employer] or Organizations: Yes    Attends Engineer, structural: More than 4 times per year    Marital Status: Divorced     Family History: The patient's family history includes Cancer (age of onset: 24) in her maternal grandmother; Diabetes in her father; Heart disease in her father; Hyperlipidemia in her mother; Hypertension in her brother, father, and mother; Obesity in an other family member. There is no history of Colon cancer, Colon polyps, Esophageal cancer, Rectal cancer, or Stomach cancer.  ROS:   Review of Systems  Constitution: Negative for decreased appetite, fever and weight gain.  HENT: Negative for congestion, ear discharge, hoarse voice and sore throat.   Eyes: Negative for discharge, redness, vision loss in right eye and visual halos.  Cardiovascular: Negative for chest pain, dyspnea on exertion, leg swelling, orthopnea and palpitations.  Respiratory: Negative for cough, hemoptysis, shortness of breath and snoring.   Endocrine: Negative for heat intolerance and polyphagia.  Hematologic/Lymphatic: Negative for bleeding problem. Does not bruise/bleed easily.  Skin: Negative for flushing, nail changes, rash and suspicious lesions.  Musculoskeletal: Negative for arthritis, joint pain, muscle cramps, myalgias, neck pain and stiffness.  Gastrointestinal: Negative for abdominal pain, bowel incontinence, diarrhea and excessive appetite.  Genitourinary: Negative for decreased libido, genital sores and incomplete emptying.  Neurological: Negative for brief paralysis, focal weakness, headaches and loss of balance.  Psychiatric/Behavioral: Negative for altered mental status, depression and suicidal ideas.  Allergic/Immunologic: Negative for HIV exposure and persistent infections.    EKGs/Labs/Other Studies Reviewed:    The following studies were reviewed today:   EKG:  The ekg ordered today demonstrates sinus  rhythm  Coronary calcium score 07/01/22 Narrative & Impression  CLINICAL DATA:  Risk stratification: 57 year-old female   EXAM: Coronary Calcium Score   TECHNIQUE: The patient was scanned on a [employer]. Axial non-contrast 3 mm slices were carried out through the heart. The data set was analyzed on a dedicated work station and scored using the Agatson method.   FINDINGS: Non-cardiac: See separate report from [facility].   Ascending Aorta: Normal caliber. Aortic atherosclerosis.   Aortic Valve Calcium score: 0   Mitral annular calcification: 0   Pericardium: Normal.   Coronary arteries: Normal origins.   Coronary Calcium Score:   Left main: 0   Left anterior descending artery: 0   Left circumflex artery: 0   Right coronary artery: 0   Total: 0   Percentile: 1st for age, sex, and race matched control.   IMPRESSION: 1. Coronary calcium score of 0. This was 1st percentile  for age, gender, and race matched controls.    Recent Labs: 05/06/2022: ALT 10; BUN 10; Creatinine, Ser 0.75; Potassium 3.7; Sodium 141  Recent Lipid Panel    Component Value Date/Time   CHOL 228 (H) 05/06/2022 0906   TRIG 50 05/06/2022 0906   HDL 86 05/06/2022 0906   CHOLHDL 2.7 05/06/2022 0906   CHOLHDL 2.7 03/05/2013 1428   VLDL 14 03/05/2013 1428   LDLCALC 133 (H) 05/06/2022 0906    Physical Exam:    VS:  BP (!) 142/86 (BP Location: Left Arm, Patient Position: Sitting, Cuff Size: Normal)   Ht 5\' 7"  (1.702 m)   Wt 202 lb 3.2 oz (91.7 kg)   SpO2 97%   BMI 31.67 kg/m     Wt Readings from Last 3 Encounters:  09/07/22 201 lb (91.2 kg)  09/07/22 202 lb 3.2 oz (91.7 kg)  07/06/22 200 lb 12.8 oz (91.1 kg)     GEN: Well nourished, well developed in no acute distress HEENT: Normal NECK: No JVD; No carotid bruits LYMPHATICS: No lymphadenopathy CARDIAC: S1S2 noted,RRR, no murmurs, rubs, gallops RESPIRATORY:  Clear to auscultation without rales, wheezing or  rhonchi  ABDOMEN: Soft, non-tender, non-distended, +bowel sounds, no guarding. EXTREMITIES: No edema, No cyanosis, no clubbing MUSCULOSKELETAL:  No deformity  SKIN: Warm and dry NEUROLOGIC:  Alert and oriented x 3, non-focal PSYCHIATRIC:  Normal affect, good insight  ASSESSMENT:    1. Carotid artery disease, unspecified laterality, unspecified type (HCC)   2. Essential hypertension, benign   3. Morbid obesity (HCC)   4. Hyperlipidemia LDL goal <70    PLAN:     Blood pressure is elevated in the office today, however base on chart review this is an isolated case- will continue to monitor and optimize antihypertensives as appropriate.   Lipid profile from 05/06/2022 LDL is 133, her goal is <70. We discuss two possibilities for addressing the use of her lipid lowering medication: take her Lipitor with coenzyme q-10 r transition to another statin medication . She prefer to do the first option. She follows with Dr. Allyne Gee today so I will hold off on getting blood work today.  We talked about multiple options for lipid lowering which includes statins and PCSK9 inhibitors.   The patient is in agreement with the above plan. The patient left the office in stable condition.  The patient will follow up in   Medication Adjustments/Labs and Tests Ordered: Current medicines are reviewed at length with the patient today.  Concerns regarding medicines are outlined above.  Orders Placed This Encounter  Procedures   EKG 12-Lead   VAS US CAROTID   No orders of the defined types were placed in this encounter.   Patient Instructions  Medication Instructions:  TAKE LIPITOR AT NIGHT WITH AT LEAST 6ONCES OF WATER AND ADD COQ 10. SEND DR. Bren Borys UPDATES VIA MYCHART IN ONE MONTH  *If you need a refill on your cardiac medications before your next appointment, please call your pharmacy*  Testing/Procedures: Your physician has requested that you have a carotid duplex. This test is an ultrasound of the  carotid arteries in your neck. It looks at blood flow through these arteries that supply the brain with blood. Allow one hour for this exam. There are no restrictions or special instructions.   Follow-Up: At [facility], you and your health needs are our priority.  As part of our continuing mission to provide you with exceptional heart care, we have created designated Provider Care Teams.  These Care Teams include your primary Cardiologist (physician) and Advanced Practice Providers (APPs -  Physician Assistants and Nurse Practitioners) who all work together to provide you with the care you need, when you need it.  We recommend signing up for the patient portal called "MyChart".  Sign up information is provided on this After Visit Summary.  MyChart is used to connect with patients for Virtual Visits (Telemedicine).  Patients are able to view lab/test results, encounter notes, upcoming appointments, etc.  Non-urgent messages can be sent to your provider as well.   To learn more about what you can do with MyChart, go to [email or web address].    Your next appointment:   4 month(s)  Provider:   Thomasene Ripple, DO        Adopting a Healthy Lifestyle.  Know what a healthy weight is for you (roughly BMI <25) and aim to maintain this   Aim for 7+ servings of fruits and vegetables daily   65-80+ fluid ounces of water or unsweet tea for healthy kidneys   Limit to max 1 drink of alcohol per day; avoid smoking/tobacco   Limit animal fats in diet for cholesterol and heart health - choose grass fed whenever available   Avoid highly processed foods, and foods high in saturated/trans fats   Aim for low stress - take time to unwind and care for your mental health   Aim for 150 min of moderate intensity exercise weekly for heart health, and weights twice weekly for bone health   Aim for 7-9 hours of sleep daily   When it comes to diets, agreement about the perfect plan isnt easy to find,  even among the experts. Experts at the [facility] of [employer] developed an idea known as the Healthy Eating Plate. Just imagine a plate divided into logical, healthy portions.   The emphasis is on diet quality:   Load up on vegetables and fruits - one-half of your plate: Aim for color and variety, and remember that potatoes dont count.   Go for whole grains - one-quarter of your plate: Whole wheat, barley, wheat berries, quinoa, oats, brown rice, and foods made with them. If you want pasta, go with whole wheat pasta.   Protein power - one-quarter of your plate: Fish, chicken, beans, and nuts are all healthy, versatile protein sources. Limit red meat.   The diet, however, does go beyond the plate, offering a few other suggestions.   Use healthy plant oils, such as olive, canola, soy, corn, sunflower and peanut. Check the labels, and avoid partially hydrogenated oil, which have unhealthy trans fats.   If youre thirsty, drink water. Coffee and tea are good in moderation, but skip sugary drinks and limit milk and dairy products to one or two daily servings.   The type of carbohydrate in the diet is more important than the amount. Some sources of carbohydrates, such as vegetables, fruits, whole grains, and beans-are healthier than others.   Finally, stay active  Signed, Thomasene Ripple, DO  09/07/2022 9:35 PM    [facility]

## 2022-09-08 LAB — MICROALBUMIN / CREATININE URINE RATIO
Creatinine, Urine: 101.3 mg/dL
Microalb/Creat Ratio: 7 mg/g creat (ref 0–29)
Microalbumin, Urine: 6.9 ug/mL

## 2022-09-09 ENCOUNTER — Ambulatory Visit: Payer: Commercial Managed Care - PPO | Admitting: Physical Therapy

## 2022-09-13 ENCOUNTER — Ambulatory Visit: Payer: PRIVATE HEALTH INSURANCE | Admitting: Physical Therapy

## 2022-09-17 ENCOUNTER — Ambulatory Visit (HOSPITAL_BASED_OUTPATIENT_CLINIC_OR_DEPARTMENT_OTHER)
Admission: RE | Admit: 2022-09-17 | Discharge: 2022-09-17 | Disposition: A | Discharge status: Home / Self Care | Payer: PRIVATE HEALTH INSURANCE | Source: Ambulatory Visit | Attending: Orthopedic Surgery | Admitting: Orthopedic Surgery

## 2022-09-17 DIAGNOSIS — M25511 Pain in right shoulder: Secondary | ICD-10-CM | POA: Diagnosis not present

## 2022-09-21 ENCOUNTER — Ambulatory Visit (INDEPENDENT_AMBULATORY_CARE_PROVIDER_SITE_OTHER): Payer: Commercial Managed Care - PPO

## 2022-09-21 ENCOUNTER — Ambulatory Visit
Admission: EM | Admit: 2022-09-21 | Discharge: 2022-09-21 | Disposition: A | Discharge status: Home / Self Care | Payer: Commercial Managed Care - PPO | Attending: Urgent Care | Admitting: Urgent Care

## 2022-09-21 ENCOUNTER — Encounter: Payer: Self-pay | Admitting: Emergency Medicine

## 2022-09-21 DIAGNOSIS — M25571 Pain in right ankle and joints of right foot: Secondary | ICD-10-CM

## 2022-09-21 DIAGNOSIS — S99911A Unspecified injury of right ankle, initial encounter: Secondary | ICD-10-CM | POA: Diagnosis not present

## 2022-09-21 NOTE — Discharge Instructions (Addendum)
Today's imaging shows no fracture. As a result, we recommend the following:  Rest ICE Compression Elevation  Use acetaminophen or NSAID medications such as ibuprofen or naproxen for pain relief.  If your symptoms do not significantly improve within 1 week, please seek orthopedic evaluation. I have provided contact information for [facility]. Alternatively, you can request a referral from your primary healthcare provider.

## 2022-09-21 NOTE — ED Triage Notes (Signed)
Stepped wrong walking down porch stairs yesterday, twisted right ankle. Antalgic gait present on walk to triage. History of previous injury to same ankle.

## 2022-09-21 NOTE — ED Provider Notes (Signed)
Mackenzie Clark    CSN: [id number] Arrival date & time: 09/21/22  1136      History   Chief Complaint Chief Complaint  Patient presents with   Ankle Pain    HPI Mackenzie Clark is a 57 y.o. female.    Ankle Pain   Patient presents to urgent care for evaluation of right ankle pain after an injury which occurred yesterday when she was descending her porch stairs.  She endorses history of previous injury to the same ankle.  Past Medical History:  Diagnosis Date   Allergy    Diabetes mellitus without complication (HCC)    diet and exercise controlled   Hypertension     Patient Active Problem List   Diagnosis Date Noted   Pure hypercholesterolemia 05/06/2022   Overweight with body mass index (BMI) of 29 to 29.9 in adult 01/22/2021   Obesity, diabetes, and hypertension syndrome (HCC) 01/22/2021   Fibroids 08/31/2019   Abnormal uterine bleeding due to intramural leiomyoma 08/31/2019   Abnormal uterine bleeding (AUB) 05/21/2019   Uterine leiomyoma 05/21/2019   Body mass index 33.0-33.9, adult 09/03/2017   Type II diabetes mellitus, uncontrolled 09/03/2017   Obesity 09/03/2017   Essential hypertension, benign 03/05/2013   Surveillance of previously prescribed contraceptive pill 03/05/2013    Past Surgical History:  Procedure Laterality Date   DILATION AND CURETTAGE OF UTERUS  2006   16 week stillbirth   IR ANGIOGRAM PELVIS SELECTIVE OR SUPRASELECTIVE  08/31/2019   IR ANGIOGRAM SELECTIVE EACH ADDITIONAL VESSEL  08/31/2019   IR EMBO TUMOR ORGAN ISCHEMIA INFARCT INC GUIDE ROADMAPPING  08/31/2019   IR FLUORO GUIDED NEEDLE PLC ASPIRATION/INJECTION LOC  08/31/2019   IR RADIOLOGIST EVAL & MGMT  07/10/2019   IR RADIOLOGIST EVAL & MGMT  09/18/2019   IR US GUIDE VASC ACCESS RIGHT  08/31/2019    OB History     Gravida  3   Para  2   Term  2   Preterm  0   AB  1   Living  2      SAB  1   IAB  0   Ectopic  0   Multiple  0   Live Births  2             Home Medications    Prior to Admission medications   Medication Sig Start Date End Date Taking? Authorizing Provider  amLODipine (NORVASC) 10 MG tablet Take 1 tablet (10 mg total) by mouth daily. 02/01/22 02/01/23  Dorothyann Peng, MD  atorvastatin (LIPITOR) 20 MG tablet Take 1 tablet (20 mg total) by mouth 3 (three) times a week on MWF. 07/07/22   Dorothyann Peng, MD  diazepam (VALIUM) 2 MG tablet Take one tablet by mouth 1 hour prior to procedure, repeat as needed 08/02/22   Dorothyann Peng, MD  Ergocalciferol 50 MCG (2000 UT) CAPS  04/27/19   [provider]  fluticasone (FLONASE) 50 MCG/ACT nasal spray Place 2 sprays into both nostrils daily. 07/13/21 07/28/22  Dorothyann Peng, MD  glucose blood test strip 1 each by Other route 2 (two) times daily. Use as instructed 07/22/17   [provider]  olmesartan-hydrochlorothiazide (BENICAR HCT) 40-25 MG tablet Take 1 tablet by mouth daily. 06/02/22 06/02/23  Dorothyann Peng, MD  tirzepatide [facility]) 7.5 MG/0.5ML Pen Inject 7.5 mg into the skin once a week. 08/31/22   Dorothyann Peng, MD    Family History Family History  Problem Relation Age of Onset   Hypertension  Mother    Hyperlipidemia Mother    Hypertension Father    Diabetes Father    Heart disease Father        heart attack age 91   Hypertension Brother    Cancer Maternal Grandmother 108       Breast   Obesity Other    Colon cancer Neg Hx    Colon polyps Neg Hx    Esophageal cancer Neg Hx    Rectal cancer Neg Hx    Stomach cancer Neg Hx     Social History Social History   Tobacco Use   Smoking status: Never   Smokeless tobacco: Never  Vaping Use   Vaping Use: Never used  Substance Use Topics   Alcohol use: Yes    Comment: occasionally   Drug use: No     Allergies   Patient has no known allergies.   Review of Systems Review of Systems   Physical Exam Triage Vital Signs ED Triage Vitals  Enc Vitals Group     BP 09/21/22 1257 (!) 153/76      Pulse Rate 09/21/22 1257 65     Resp 09/21/22 1257 16     Temp 09/21/22 1257 98.6 F (37 C)     Temp Source 09/21/22 1257 Oral     SpO2 09/21/22 1257 97 %     Weight --      Height --      Head Circumference --      Peak Flow --      Pain Score 09/21/22 1300 7     Pain Loc --      Pain Edu? --      Excl. in GC? --    No data found.  Updated Vital Signs BP (!) 153/76 (BP Location: Left Arm)   Pulse 65   Temp 98.6 F (37 C) (Oral)   Resp 16   SpO2 97%   Visual Acuity Right Eye Distance:   Left Eye Distance:   Bilateral Distance:    Right Eye Near:   Left Eye Near:    Bilateral Near:     Physical Exam Vitals reviewed.  Constitutional:      Appearance: Normal appearance.  Musculoskeletal:     Right ankle: Tenderness present over the medial malleolus. No lateral malleolus or base of 5th metatarsal tenderness.     Right Achilles Tendon: No tenderness or defects. Thompson's test negative.  Skin:    General: Skin is warm and dry.  Neurological:     General: No focal deficit present.     Mental Status: She is alert and oriented to person, place, and time.     Gait: Gait abnormal.     Comments: Antalgic gait  Psychiatric:        Mood and Affect: Mood normal.        Behavior: Behavior normal.     UC Treatments / Results  Labs (all labs ordered are listed, but only abnormal results are displayed) Labs Reviewed - No data to display  EKG   Radiology No results found.  Procedures Procedures (including critical care time)  Medications Ordered in UC Medications - No data to display  Initial Impression / Assessment and Plan / UC Course  I have reviewed the triage vital signs and the nursing notes.  Pertinent labs & imaging results that were available during my care of the patient were reviewed by me and considered in my medical decision making (see chart for details).  Mackenzie Clark is a 57 y.o. female presenting with R ankle pain following a fall.  Patient is afebrile without recent antipyretics, satting well on room air. Overall is well appearing, well hydrated, without respiratory distress.  Tenderness and swelling at the right medial malleolus.  Reviewed relevant chart history.   Nurse reports patient with antalgic gait while walking to the exam room.  Sprain is suspected, however x-ray performed to rule out fracture as the patient satisfies [employer].  Impression:  No acute bony findings   Recommended RICE treatment to patient and demonstrated correct technique for ACE wraps. Acetaminophen or NSAIDs for pain relief.  Counseled patient on potential for adverse effects with medications prescribed/recommended today, ER and return-to-clinic precautions discussed, patient verbalized understanding and agreement with care plan.  Final Clinical Impressions(s) / UC Diagnoses   Final diagnoses:  Acute right ankle pain   Discharge Instructions   None    ED Prescriptions   None    PDMP not reviewed this encounter.   Charma Igo, [place] 09/21/22 1434

## 2022-10-04 ENCOUNTER — Other Ambulatory Visit: Payer: Self-pay

## 2022-10-04 ENCOUNTER — Other Ambulatory Visit: Payer: Self-pay | Admitting: Internal Medicine

## 2022-10-04 ENCOUNTER — Encounter: Payer: Self-pay | Admitting: Internal Medicine

## 2022-10-04 ENCOUNTER — Other Ambulatory Visit (HOSPITAL_COMMUNITY): Payer: Self-pay

## 2022-10-04 MED ORDER — MOUNJARO 10 MG/0.5ML ~~LOC~~ SOAJ
10.0000 mg | SUBCUTANEOUS | 1 refills | Status: DC
  Filled 2022-10-04: qty 2, 28d supply, fill #0
  Filled 2022-11-09: qty 2, 28d supply, fill #1

## 2022-10-04 MED ORDER — MOUNJARO 10 MG/0.5ML ~~LOC~~ SOAJ
10.0000 mg | SUBCUTANEOUS | 0 refills | Status: DC
  Filled 2022-10-04: qty 2, 28d supply, fill #0

## 2022-10-11 ENCOUNTER — Ambulatory Visit (HOSPITAL_COMMUNITY): Payer: Commercial Managed Care - PPO

## 2022-10-20 DIAGNOSIS — E785 Hyperlipidemia, unspecified: Secondary | ICD-10-CM | POA: Diagnosis not present

## 2022-10-20 DIAGNOSIS — N926 Irregular menstruation, unspecified: Secondary | ICD-10-CM | POA: Diagnosis not present

## 2022-10-20 DIAGNOSIS — E559 Vitamin D deficiency, unspecified: Secondary | ICD-10-CM | POA: Diagnosis not present

## 2022-10-20 DIAGNOSIS — E1169 Type 2 diabetes mellitus with other specified complication: Secondary | ICD-10-CM | POA: Diagnosis not present

## 2022-10-21 ENCOUNTER — Other Ambulatory Visit (HOSPITAL_COMMUNITY): Payer: Self-pay

## 2022-10-21 ENCOUNTER — Encounter: Payer: Self-pay | Admitting: Internal Medicine

## 2022-10-21 ENCOUNTER — Encounter: Payer: Self-pay | Admitting: Cardiology

## 2022-10-21 ENCOUNTER — Other Ambulatory Visit: Payer: Self-pay

## 2022-10-21 ENCOUNTER — Ambulatory Visit (INDEPENDENT_AMBULATORY_CARE_PROVIDER_SITE_OTHER): Payer: Commercial Managed Care - PPO | Admitting: Internal Medicine

## 2022-10-21 VITALS — BP 122/86 | HR 80 | Temp 98.2°F | Ht 67.0 in | Wt 198.0 lb

## 2022-10-21 DIAGNOSIS — E785 Hyperlipidemia, unspecified: Secondary | ICD-10-CM

## 2022-10-21 DIAGNOSIS — E559 Vitamin D deficiency, unspecified: Secondary | ICD-10-CM

## 2022-10-21 DIAGNOSIS — S93401D Sprain of unspecified ligament of right ankle, subsequent encounter: Secondary | ICD-10-CM | POA: Diagnosis not present

## 2022-10-21 DIAGNOSIS — E1169 Type 2 diabetes mellitus with other specified complication: Secondary | ICD-10-CM | POA: Diagnosis not present

## 2022-10-21 DIAGNOSIS — I700 Atherosclerosis of aorta: Secondary | ICD-10-CM | POA: Diagnosis not present

## 2022-10-21 DIAGNOSIS — I119 Hypertensive heart disease without heart failure: Secondary | ICD-10-CM

## 2022-10-21 DIAGNOSIS — N926 Irregular menstruation, unspecified: Secondary | ICD-10-CM

## 2022-10-21 DIAGNOSIS — E66811 Obesity, class 1: Secondary | ICD-10-CM

## 2022-10-21 DIAGNOSIS — F419 Anxiety disorder, unspecified: Secondary | ICD-10-CM

## 2022-10-21 DIAGNOSIS — Z0000 Encounter for general adult medical examination without abnormal findings: Secondary | ICD-10-CM | POA: Diagnosis not present

## 2022-10-21 DIAGNOSIS — E6609 Other obesity due to excess calories: Secondary | ICD-10-CM | POA: Diagnosis not present

## 2022-10-21 DIAGNOSIS — Z6831 Body mass index (BMI) 31.0-31.9, adult: Secondary | ICD-10-CM

## 2022-10-21 LAB — CMP14+EGFR
ALT: 11 IU/L (ref 0–32)
AST: 14 IU/L (ref 0–40)
Albumin: 4.3 g/dL (ref 3.8–4.9)
Alkaline Phosphatase: 82 IU/L (ref 44–121)
BUN/Creatinine Ratio: 18 (ref 9–23)
BUN: 13 mg/dL (ref 6–24)
Bilirubin Total: 0.4 mg/dL (ref 0.0–1.2)
CO2: 26 mmol/L (ref 20–29)
Calcium: 10 mg/dL (ref 8.7–10.2)
Chloride: 101 mmol/L (ref 96–106)
Creatinine, Ser: 0.74 mg/dL (ref 0.57–1.00)
Globulin, Total: 2.3 g/dL (ref 1.5–4.5)
Glucose: 104 mg/dL — ABNORMAL HIGH (ref 70–99)
Potassium: 3.4 mmol/L — ABNORMAL LOW (ref 3.5–5.2)
Sodium: 141 mmol/L (ref 134–144)
Total Protein: 6.6 g/dL (ref 6.0–8.5)
eGFR: 96 mL/min/{1.73_m2} (ref >59)

## 2022-10-21 LAB — CBC
Hematocrit: 36.8 % (ref 34.0–46.6)
Hemoglobin: 12.3 g/dL (ref 11.1–15.9)
MCH: 29.1 pg (ref 26.6–33.0)
MCHC: 33.4 g/dL (ref 31.5–35.7)
MCV: 87 fL (ref 79–97)
Platelets: 235 10*3/uL (ref 150–450)
RBC: 4.23 x10E6/uL (ref 3.77–5.28)
RDW: 14 % (ref 11.7–15.4)
WBC: 3.5 10*3/uL (ref 3.4–10.8)

## 2022-10-21 LAB — HEMOGLOBIN A1C
Est. average glucose Bld gHb Est-mCnc: 117 mg/dL
Hgb A1c MFr Bld: 5.7 % — ABNORMAL HIGH (ref 4.8–5.6)

## 2022-10-21 LAB — LIPID PANEL
Chol/HDL Ratio: 2.7 ratio (ref 0.0–4.4)
Cholesterol, Total: 239 mg/dL — ABNORMAL HIGH (ref 100–199)
HDL: 89 mg/dL (ref >39)
LDL Chol Calc (NIH): 143 mg/dL — ABNORMAL HIGH (ref 0–99)
Triglycerides: 44 mg/dL (ref 0–149)
VLDL Cholesterol Cal: 7 mg/dL (ref 5–40)

## 2022-10-21 LAB — FOLLICLE STIMULATING HORMONE: FSH: 31.1 m[IU]/mL

## 2022-10-21 LAB — VITAMIN D 25 HYDROXY (VIT D DEFICIENCY, FRACTURES): Vit D, 25-Hydroxy: 62.9 ng/mL (ref 30.0–100.0)

## 2022-10-21 MED ORDER — DICLOFENAC SODIUM 1 % EX GEL
2.0000 g | Freq: Four times a day (QID) | CUTANEOUS | 0 refills | Status: DC
  Filled 2022-10-21: qty 100, 13d supply, fill #0

## 2022-10-21 MED ORDER — OLMESARTAN MEDOXOMIL-HCTZ 40-25 MG PO TABS
1.0000 | ORAL_TABLET | Freq: Every day | ORAL | 2 refills | Status: DC
Start: 2022-10-21 — End: 2023-09-01
  Filled 2022-10-21 – 2022-11-23 (×2): qty 90, 90d supply, fill #0
  Filled 2023-02-22 – 2023-02-28 (×3): qty 90, 90d supply, fill #1
  Filled 2023-05-31: qty 90, 90d supply, fill #2

## 2022-10-21 MED ORDER — BUPROPION HCL ER (XL) 150 MG PO TB24
150.0000 mg | ORAL_TABLET | ORAL | 2 refills | Status: DC
  Filled 2022-10-21: qty 30, 30d supply, fill #0

## 2022-10-21 MED ORDER — ROSUVASTATIN CALCIUM 5 MG PO TABS
5.0000 mg | ORAL_TABLET | Freq: Every day | ORAL | 3 refills | Status: DC
  Filled 2022-10-21: qty 90, 90d supply, fill #0
  Filled 2023-02-22 – 2023-04-13 (×2): qty 90, 90d supply, fill #1
  Filled 2023-10-04: qty 90, 90d supply, fill #2

## 2022-10-21 NOTE — Patient Instructions (Signed)

## 2022-10-21 NOTE — Progress Notes (Signed)
I,Jameka J Llittleton, CMA,acting as a Neurosurgeon for [name], MD.,have documented all relevant documentation on the behalf of [name], MD,as directed by  [name], MD while in the presence of [name], MD.  Subjective:    Patient ID: Mackenzie Clark , female    DOB: [birth year] , 57 y.o.   MRN: [id number]  Chief Complaint  Patient presents with   Annual Exam   Diabetes   Hypertension    HPI  Patient is here for physical exam. She is followed by Dr. Tinnie Gens at [facility] for her pelvic exams.  She reports compliance with medication. She has no concerns at this time. Denies headache, chest pain,and SOB.  She would like to discuss anxiety.  She has been dealing with both personal/work issues.  She is interested in treatment because she is feeling quite overwhelmed.     EKG completed on 09/07/2022.   Diabetes She presents for her follow-up diabetic visit. She has type 2 diabetes mellitus. Her disease course has been stable. Hypoglycemia symptoms include nervousness/anxiousness. Pertinent negatives for diabetes include no blurred vision. There are no hypoglycemic complications. Risk factors for coronary artery disease include diabetes mellitus, hypertension and obesity. She is compliant with treatment most of the time. She participates in exercise intermittently. An ACE inhibitor/angiotensin II receptor blocker is being taken. Eye exam is current.  Hypertension This is a chronic problem. The current episode started more than 1 year ago. The problem has been gradually improving since onset. The problem is controlled. Pertinent negatives include no blurred vision. Past treatments include angiotensin blockers.     Past Medical History:  Diagnosis Date   Allergy    Diabetes mellitus without complication (HCC)    diet and exercise controlled   Hypertension      Family History  Problem Relation Age of Onset   Hypertension Mother    Hyperlipidemia Mother     Hypertension Father    Diabetes Father    Heart disease Father        heart attack age 66   Hypertension Brother    Cancer Maternal Grandmother 94       Breast   Obesity Other    Colon cancer Neg Hx    Colon polyps Neg Hx    Esophageal cancer Neg Hx    Rectal cancer Neg Hx    Stomach cancer Neg Hx      Current Outpatient Medications:    amLODipine (NORVASC) 10 MG tablet, Take 1 tablet (10 mg total) by mouth daily., Disp: 90 tablet, Rfl: 2   buPROPion (WELLBUTRIN XL) 150 MG 24 hr tablet, Take 1 tablet (150 mg total) by mouth every morning., Disp: 30 tablet, Rfl: 2   diclofenac Sodium (VOLTAREN ARTHRITIS PAIN) 1 % GEL, Apply 2 g topically 4 (four) times daily., Disp: 100 g, Rfl: 0   Ergocalciferol 50 MCG (2000 UT) CAPS, , Disp: , Rfl:    glucose blood test strip, 1 each by Other route 2 (two) times daily. Use as instructed, Disp: , Rfl:    tirzepatide (MOUNJARO) 10 MG/0.5ML Pen, Inject 10 mg into the skin once a week., Disp: 2 mL, Rfl: 1   fluticasone (FLONASE) 50 MCG/ACT nasal spray, Place 2 sprays into both nostrils daily., Disp: 54 g, Rfl: 2   olmesartan-hydrochlorothiazide (BENICAR HCT) 40-25 MG tablet, Take 1 tablet by mouth daily., Disp: 90 tablet, Rfl: 2   rosuvastatin (CRESTOR) 5 MG tablet, Take 1 tablet (5 mg  total) by mouth daily., Disp: 90 tablet, Rfl: 3   No Known Allergies    The patient states she uses none for birth control. No LMP recorded. Patient is perimenopausal.. Negative for Dysmenorrhea. Negative for: breast discharge, breast lump(s), breast pain and breast self exam. Associated symptoms include abnormal vaginal bleeding. Pertinent negatives include abnormal bleeding (hematology), anxiety, decreased libido, depression, difficulty falling sleep, dyspareunia, history of infertility, nocturia, sexual dysfunction, sleep disturbances, urinary incontinence, urinary urgency, vaginal discharge and vaginal itching. Diet regular.The patient states her exercise level is   minimal recently.   . The patient's tobacco use is:  Social History   Tobacco Use  Smoking Status Never  Smokeless Tobacco Never  . She has been exposed to passive smoke. The patient's alcohol use is:  Social History   Substance and Sexual Activity  Alcohol Use Yes   Comment: occasionally    Review of Systems  Constitutional: Negative.   HENT: Negative.    Eyes: Negative.  Negative for blurred vision.  Respiratory: Negative.    Cardiovascular: Negative.   Gastrointestinal: Negative.   Endocrine: Negative.   Genitourinary: Negative.   Musculoskeletal:  Positive for arthralgias.       She c/o R ankle pain. Seen at [facility] for same sx on 6/18 after a fall. Stepped wrong walking down porch stairs on 09/20/22, twisted right ankle.  Skin: Negative.   Allergic/Immunologic: Negative.   Neurological:  Positive for syncope.  Hematological: Negative.   Psychiatric/Behavioral:  The patient is nervous/anxious.         She would like to add that she has been feeling more anxious. She states there are many life stressors that she is dealing with - personal and work stressors.  Work stressors compounded by being short staffed at work.  She admits this is taking a toll.      Today's Vitals   10/21/22 0923 10/21/22 0951  BP: (!) 130/94 122/86  Pulse: 80   Temp: 98.2 F (36.8 C)   SpO2: 98%   Weight: 198 lb (89.8 kg)   Height: 5\' 7"  (1.702 m)    Body mass index is 31.01 kg/m.  Wt Readings from Last 3 Encounters:  10/21/22 198 lb (89.8 kg)  09/07/22 201 lb (91.2 kg)  09/07/22 202 lb 3.2 oz (91.7 kg)     Objective:  Physical Exam Vitals and nursing note reviewed.  Constitutional:      Appearance: Normal appearance.  HENT:     Head: Normocephalic and atraumatic.     Right Ear: Tympanic membrane, ear canal and external ear normal.     Left Ear: Tympanic membrane, ear canal and external ear normal.     Nose: Nose normal.     Mouth/Throat:     Mouth: Mucous membranes are moist.      Pharynx: Oropharynx is clear.  Eyes:     Extraocular Movements: Extraocular movements intact.     Conjunctiva/sclera: Conjunctivae normal.     Pupils: Pupils are equal, round, and reactive to light.  Cardiovascular:     Rate and Rhythm: Normal rate and regular rhythm.     Pulses: Normal pulses.          Dorsalis pedis pulses are 2+ on the right side and 2+ on the left side.     Heart sounds: Normal heart sounds.  Pulmonary:     Effort: Pulmonary effort is normal.     Breath sounds: Normal breath sounds.  Chest:  Breasts:    Tanner Score  is 5.     Right: Normal.     Left: Normal.  Abdominal:     General: Abdomen is flat. Bowel sounds are normal.     Palpations: Abdomen is soft.  Genitourinary:    Comments: deferred Musculoskeletal:        General: Tenderness present.     Cervical back: Normal range of motion and neck supple.  Feet:     Right foot:     Protective Sensation: 5 sites tested.  5 sites sensed.     Skin integrity: Skin integrity normal.     Toenail Condition: Right toenails are normal.     Left foot:     Protective Sensation: 5 sites tested.  5 sites sensed.     Skin integrity: Skin integrity normal.     Toenail Condition: Left toenails are normal.  Skin:    General: Skin is warm and dry.  Neurological:     General: No focal deficit present.     Mental Status: She is alert and oriented to person, place, and time.  Psychiatric:        Mood and Affect: Mood normal.        Behavior: Behavior normal.         Assessment And Plan:     Encounter for general adult medical examination w/o abnormal findings Assessment & Plan: A full exam was performed.  Importance of monthly self breast exams was discussed with the patient. PATIENT IS ADVISED TO GET 30-45 MINUTES REGULAR EXERCISE NO LESS THAN FOUR TO FIVE DAYS PER WEEK - BOTH WEIGHTBEARING EXERCISES AND AEROBIC ARE RECOMMENDED.  PATIENT IS ADVISED TO FOLLOW A HEALTHY DIET WITH AT LEAST SIX FRUITS/VEGGIES PER DAY,  DECREASE INTAKE OF RED MEAT, AND TO INCREASE FISH INTAKE TO TWO DAYS PER WEEK.  MEATS/FISH SHOULD NOT BE FRIED, BAKED OR BROILED IS PREFERABLE.  IT IS ALSO IMPORTANT TO CUT BACK ON YOUR SUGAR INTAKE. PLEASE AVOID ANYTHING WITH ADDED SUGAR, CORN SYRUP OR OTHER SWEETENERS. IF YOU MUST USE A SWEETENER, YOU CAN TRY STEVIA. IT IS ALSO IMPORTANT TO AVOID ARTIFICIALLY SWEETENERS AND DIET BEVERAGES. LASTLY, I SUGGEST WEARING SPF 50 SUNSCREEN ON EXPOSED PARTS AND ESPECIALLY WHEN IN THE DIRECT SUNLIGHT FOR AN EXTENDED PERIOD OF TIME.  PLEASE AVOID FAST FOOD RESTAURANTS AND INCREASE YOUR WATER INTAKE.    Dyslipidemia associated with type 2 diabetes mellitus (HCC) Assessment & Plan: Chronic, diabetic foot exam was performed. She will c/w Mounjaro 10mg  weekly, will consider titration when she is done with current supply.  She will f/u in 3-4 months for re-evaluation. I DISCUSSED WITH THE PATIENT AT LENGTH REGARDING THE GOALS OF GLYCEMIC CONTROL AND POSSIBLE LONG-TERM COMPLICATIONS.  I  ALSO STRESSED THE IMPORTANCE OF COMPLIANCE WITH HOME GLUCOSE MONITORING, DIETARY RESTRICTIONS INCLUDING AVOIDANCE OF SUGARY DRINKS/PROCESSED FOODS,  ALONG WITH REGULAR EXERCISE.  I  ALSO STRESSED THE IMPORTANCE OF ANNUAL EYE EXAMS, SELF FOOT CARE AND COMPLIANCE WITH OFFICE VISITS.   Orders: -     CMP14+EGFR -     CBC -     Lipid panel -     Hemoglobin A1c  Hypertensive heart disease without heart failure Assessment & Plan: Chronic, well controlled. She will continue with current meds. Encouraged to avoid adding salt to her foods.   Orders: -     Olmesartan Medoxomil-HCTZ; Take 1 tablet by mouth daily.  Dispense: 90 tablet; Refill: 2  Aortic atherosclerosis (HCC) Assessment & Plan: Chronic, LDL goal < 70.  Started on rosuvastatin 5mg   by Cardiology.   She is encouraged to follow a heart healthy lifestyle.    Anxiety Assessment & Plan: She is hesitant to start anything that will cause weight gain. Will start Wellbutrin XL  150mg  daily. Possible side effects d/w patient. Advised to take in am. She agrees to f/u in 4-6 weeks for re-evaluation.    Vitamin D deficiency disease Assessment & Plan: I will check a vitamin D level and supplement as needed.   Orders: -     VITAMIN D 25 Hydroxy (Vit-D Deficiency, Fractures)  Irregular menses Assessment & Plan: She is s/p uterine artery embolization. She has been experiencing some spotting. I will check FSH today. She will f/u with GYN if needed.   Orders: -     Follicle stimulating hormone  Sprain of right ankle, unspecified ligament, subsequent encounter Assessment & Plan: Urgent care notes reviewed, occurred on 09/20/22. She is still wearing ACE bandage. Advised to apply Voltaren gel to affected area bid prn.    Class 1 obesity due to excess calories with serious comorbidity and body mass index (BMI) of 31.0 to 31.9 in adult Assessment & Plan: She is encouraged to strive for BMI less than 30 to decrease cardiac risk. Advised to aim for at least 150 minutes of exercise per week.    Other orders -     buPROPion HCl ER (XL); Take 1 tablet (150 mg total) by mouth every morning.  Dispense: 30 tablet; Refill: 2 -     Diclofenac Sodium; Apply 2 g topically 4 (four) times daily.  Dispense: 100 g; Refill: 0     Return in 4 weeks (on 11/18/2022), or 4 weeks virtual visit, for 1 year physical, 4 month DM. Patient was given opportunity to ask questions. Patient verbalized understanding of the plan and was able to repeat key elements of the plan. All questions were answered to their satisfaction.   I, [name], MD, have reviewed all documentation for this visit. The documentation on 10/21/22 for the exam, diagnosis, procedures, and orders are all accurate and complete.

## 2022-10-25 ENCOUNTER — Other Ambulatory Visit (HOSPITAL_COMMUNITY): Payer: Self-pay

## 2022-10-26 ENCOUNTER — Other Ambulatory Visit (HOSPITAL_COMMUNITY): Payer: Self-pay

## 2022-11-01 DIAGNOSIS — S93401A Sprain of unspecified ligament of right ankle, initial encounter: Secondary | ICD-10-CM | POA: Insufficient documentation

## 2022-11-01 DIAGNOSIS — N926 Irregular menstruation, unspecified: Secondary | ICD-10-CM | POA: Insufficient documentation

## 2022-11-01 DIAGNOSIS — E559 Vitamin D deficiency, unspecified: Secondary | ICD-10-CM | POA: Insufficient documentation

## 2022-11-01 DIAGNOSIS — I700 Atherosclerosis of aorta: Secondary | ICD-10-CM | POA: Insufficient documentation

## 2022-11-01 DIAGNOSIS — Z0000 Encounter for general adult medical examination without abnormal findings: Secondary | ICD-10-CM | POA: Insufficient documentation

## 2022-11-01 DIAGNOSIS — F419 Anxiety disorder, unspecified: Secondary | ICD-10-CM | POA: Insufficient documentation

## 2022-11-02 NOTE — Assessment & Plan Note (Addendum)
Chronic, diabetic foot exam was performed. She will c/w Mounjaro 10mg  weekly, will consider titration when she is done with current supply.  She will f/u in 3-4 months for re-evaluation. I DISCUSSED WITH THE PATIENT AT LENGTH REGARDING THE GOALS OF GLYCEMIC CONTROL AND POSSIBLE LONG-TERM COMPLICATIONS.  I  ALSO STRESSED THE IMPORTANCE OF COMPLIANCE WITH HOME GLUCOSE MONITORING, DIETARY RESTRICTIONS INCLUDING AVOIDANCE OF SUGARY DRINKS/PROCESSED FOODS,  ALONG WITH REGULAR EXERCISE.  I  ALSO STRESSED THE IMPORTANCE OF ANNUAL EYE EXAMS, SELF FOOT CARE AND COMPLIANCE WITH OFFICE VISITS.

## 2022-11-02 NOTE — Assessment & Plan Note (Signed)
She is s/p uterine artery embolization. She has been experiencing some spotting. I will check FSH today. She will f/u with GYN if needed.

## 2022-11-02 NOTE — Assessment & Plan Note (Signed)
Chronic, LDL goal < 70.  Started on rosuvastatin 5mg  by Cardiology.   She is encouraged to follow a heart healthy lifestyle.

## 2022-11-02 NOTE — Assessment & Plan Note (Signed)

## 2022-11-02 NOTE — Assessment & Plan Note (Signed)
She is hesitant to start anything that will cause weight gain. Will start Wellbutrin XL 150mg  daily. Possible side effects d/w patient. Advised to take in am. She agrees to f/u in 4-6 weeks for re-evaluation.

## 2022-11-02 NOTE — Assessment & Plan Note (Signed)
Chronic, well controlled. She will continue with current meds. Encouraged to avoid adding salt to her foods.

## 2022-11-02 NOTE — Assessment & Plan Note (Signed)
Urgent care notes reviewed, occurred on 09/20/22. She is still wearing ACE bandage. Advised to apply Voltaren gel to affected area bid prn.

## 2022-11-02 NOTE — Assessment & Plan Note (Signed)
I will check a vitamin D level and supplement as needed.

## 2022-11-02 NOTE — Assessment & Plan Note (Signed)
She is encouraged to strive for BMI less than 30 to decrease cardiac risk. Advised to aim for at least 150 minutes of exercise per week.  

## 2022-11-09 ENCOUNTER — Other Ambulatory Visit: Payer: Self-pay

## 2022-11-18 ENCOUNTER — Encounter: Payer: Self-pay | Admitting: Internal Medicine

## 2022-11-18 ENCOUNTER — Other Ambulatory Visit: Payer: Self-pay

## 2022-11-18 ENCOUNTER — Telehealth: Payer: Commercial Managed Care - PPO | Admitting: Internal Medicine

## 2022-11-18 ENCOUNTER — Encounter: Payer: Commercial Managed Care - PPO | Admitting: Internal Medicine

## 2022-11-18 ENCOUNTER — Other Ambulatory Visit (HOSPITAL_COMMUNITY): Payer: Self-pay

## 2022-11-18 VITALS — Wt 195.0 lb

## 2022-11-18 VITALS — Ht 67.0 in | Wt 195.0 lb

## 2022-11-18 DIAGNOSIS — Z6830 Body mass index (BMI) 30.0-30.9, adult: Secondary | ICD-10-CM

## 2022-11-18 DIAGNOSIS — F419 Anxiety disorder, unspecified: Secondary | ICD-10-CM | POA: Diagnosis not present

## 2022-11-18 MED ORDER — BUPROPION HCL ER (XL) 150 MG PO TB24
150.0000 mg | ORAL_TABLET | ORAL | 1 refills | Status: DC
  Filled 2022-11-18: qty 90, 90d supply, fill #0
  Filled 2023-02-22 – 2023-02-28 (×3): qty 90, 90d supply, fill #1

## 2022-11-18 NOTE — Patient Instructions (Signed)

## 2022-11-18 NOTE — Assessment & Plan Note (Signed)
Improved with use of Wellbutrin XL 150mg  daily. She will continue with this dose for now. She has previously scheduled appt in Nov 2024, will follow up at that time. Will see her sooner if she decides a dose increase is needed.

## 2022-11-18 NOTE — Progress Notes (Signed)
Virtual Visit via Video   This visit type was conducted due to national recommendations for restrictions regarding the COVID-19 Pandemic (e.g. social distancing) in an effort to limit this patient's exposure and mitigate transmission in our community.  Due to her co-morbid illnesses, this patient is at least at moderate risk for complications without adequate follow up.  This format is felt to be most appropriate for this patient at this time.  All issues noted in this document were discussed and addressed.  A limited physical exam was performed with this format.    This visit type was conducted due to national recommendations for restrictions regarding the COVID-19 Pandemic (e.g. social distancing) in an effort to limit this patient's exposure and mitigate transmission in our community.  Patients identity confirmed using two different identifiers.  This format is felt to be most appropriate for this patient at this time.  All issues noted in this document were discussed and addressed.  No physical exam was performed (except for noted visual exam findings with Video Visits).    Date:  11/18/2022   ID:  Mackenzie Clark, DOB [birth year], MRN [id number]  Patient Location:  Work, private office  Provider location:   Biomedical engineer Complaint:  "I have anxiety f/u"  History of Present Illness:    Mackenzie Clark is a 57 y.o. female who presents via video conferencing for a telehealth visit today.    The patient does have symptoms concerning for COVID-19 infection (fever, chills, cough, or new shortness of breath).   She presents today for virtual visit. She prefers this method of contact.  She presents today for Wellbutrin follow up. She reports compliance with medication. This medication was started to address anxiety.  She states her sx have improved, she does not feel as "on edge".  She denies having any other specific questions or concerns.        Past Medical History:  Diagnosis  Date   Allergy    Diabetes mellitus without complication (HCC)    diet and exercise controlled   Hypertension    Past Surgical History:  Procedure Laterality Date   DILATION AND CURETTAGE OF UTERUS  2006   16 week stillbirth   IR ANGIOGRAM PELVIS SELECTIVE OR SUPRASELECTIVE  08/31/2019   IR ANGIOGRAM SELECTIVE EACH ADDITIONAL VESSEL  08/31/2019   IR EMBO TUMOR ORGAN ISCHEMIA INFARCT INC GUIDE ROADMAPPING  08/31/2019   IR FLUORO GUIDED NEEDLE PLC ASPIRATION/INJECTION LOC  08/31/2019   IR RADIOLOGIST EVAL & MGMT  07/10/2019   IR RADIOLOGIST EVAL & MGMT  09/18/2019   IR US GUIDE VASC ACCESS RIGHT  08/31/2019     No outpatient medications have been marked as taking for the 11/18/22 encounter (Video Visit) with Dorothyann Peng, MD.     Allergies:   Patient has no known allergies.   Social History   Tobacco Use   Smoking status: Never   Smokeless tobacco: Never  Vaping Use   Vaping status: Never Used  Substance Use Topics   Alcohol use: Yes    Comment: occasionally   Drug use: No     Family Hx: The patient's family history includes Cancer (age of onset: 65) in her maternal grandmother; Diabetes in her father; Heart disease in her father; Hyperlipidemia in her mother; Hypertension in her brother, father, and mother; Obesity in an other family member. There is no history of Colon cancer, Colon polyps, Esophageal cancer, Rectal cancer, or Stomach cancer.  ROS:  Please see the history of present illness.    Review of Systems  Constitutional: Negative.   HENT: Negative.    Eyes: Negative.   Respiratory: Negative.    Cardiovascular: Negative.   Gastrointestinal: Negative.   Neurological: Negative.   Endo/Heme/Allergies: Negative.   Psychiatric/Behavioral:  The patient is nervous/anxious.     All other systems reviewed and are negative.   Labs/Other Tests and Data Reviewed:    Recent Labs: 10/20/2022: ALT 11; BUN 13; Creatinine, Ser 0.74; Hemoglobin 12.3; Platelets 235; Potassium  3.4; Sodium 141   Recent Lipid Panel Lab Results  Component Value Date/Time   CHOL 239 (H) 10/20/2022 08:53 AM   TRIG 44 10/20/2022 08:53 AM   HDL 89 10/20/2022 08:53 AM   CHOLHDL 2.7 10/20/2022 08:53 AM   CHOLHDL 2.7 03/05/2013 02:28 PM   LDLCALC 143 (H) 10/20/2022 08:53 AM    Wt Readings from Last 3 Encounters:  11/18/22 195 lb (88.5 kg)  11/18/22 195 lb (88.5 kg)  10/21/22 198 lb (89.8 kg)     Exam:    Vital Signs:  Wt 195 lb (88.5 kg)   BMI 30.54 kg/m     Physical Exam Vitals and nursing note reviewed.  Constitutional:      Appearance: Normal appearance.  HENT:     Head: Normocephalic and atraumatic.  Eyes:     Extraocular Movements: Extraocular movements intact.  Pulmonary:     Effort: Pulmonary effort is normal.  Musculoskeletal:     Cervical back: Normal range of motion.  Neurological:     Mental Status: She is alert and oriented to person, place, and time.  Psychiatric:        Mood and Affect: Affect normal.     ASSESSMENT & PLAN:    Anxiety Assessment & Plan: Improved with use of Wellbutrin XL 150mg  daily. She will continue with this dose for now. She has previously scheduled appt in Nov 2024, will follow up at that time. Will see her sooner if she decides a dose increase is needed.    Body mass index (BMI) of 30.0-30.9 in adult Assessment & Plan: She was congratulated on her 3lb weight loss since July 2024. She is encouraged to aim for at least 150 minutes of exercise/week.    Other orders -     buPROPion HCl ER (XL); Take 1 tablet (150 mg total) by mouth every morning.  Dispense: 90 tablet; Refill: 1     COVID-19 Education: The signs and symptoms of COVID-19 were discussed with the patient and how to seek care for testing (follow up with PCP or arrange E-visit).  The importance of social distancing was discussed today.  Patient Risk:   After full review of this patients clinical status, I feel that they are at least moderate risk at this  time.  Time:   Today, I have spent 8 minutes/ seconds with the patient with telehealth technology discussing above diagnoses.     Medication Adjustments/Labs and Tests Ordered: Current medicines are reviewed at length with the patient today.  Concerns regarding medicines are outlined above.   Tests Ordered: No orders of the defined types were placed in this encounter.   Medication Changes: Meds ordered this encounter  Medications   buPROPion (WELLBUTRIN XL) 150 MG 24 hr tablet    Sig: Take 1 tablet (150 mg total) by mouth every morning.    Dispense:  90 tablet    Refill:  1    Disposition:  Follow up prn  Signed, [name], MD

## 2022-11-18 NOTE — Assessment & Plan Note (Signed)
She was congratulated on her 3lb weight loss since July 2024. She is encouraged to aim for at least 150 minutes of exercise/week.

## 2022-11-18 NOTE — Progress Notes (Unsigned)
Virtual Visit via ERROR   This visit type was conducted due to national recommendations for restrictions regarding the COVID-19 Pandemic (e.g. social distancing) in an effort to limit this patient's exposure and mitigate transmission in our community.  Due to her co-morbid illnesses, this patient is at least at moderate risk for complications without adequate follow up.  This format is felt to be most appropriate for this patient at this time.  All issues noted in this document were discussed and addressed.  A limited physical exam was performed with this format.    This visit type was conducted due to national recommendations for restrictions regarding the COVID-19 Pandemic (e.g. social distancing) in an effort to limit this patient's exposure and mitigate transmission in our community.  Patients identity confirmed using two different identifiers.  This format is felt to be most appropriate for this patient at this time.  All issues noted in this document were discussed and addressed.  No physical exam was performed (except for noted visual exam findings with Video Visits).    Date:  12/02/2022   ID:  Mackenzie Clark, DOB April 10, 1965, MRN [id number]  Patient Location:  NOT DONE  Provider location:   Office    Chief Complaint:  NOT DONE  History of Present Illness:    Mackenzie Clark is a 57 y.o. female who presents via video conferencing for a telehealth visit today.    The patient does not have symptoms concerning for COVID-19 infection (fever, chills, cough, or new shortness of breath).   Pt presents today Wellbutrin follow up. She reports compliance with medication. She reports no specific questions or concerns.      Diabetes She presents for her follow-up diabetic visit. She has type 2 diabetes mellitus. Her disease course has been stable. There are no hypoglycemic associated symptoms. Pertinent negatives for diabetes include no blurred vision and no chest pain. There are no  hypoglycemic complications. Risk factors for coronary artery disease include diabetes mellitus, hypertension and obesity. She is compliant with treatment most of the time. She participates in exercise intermittently. An ACE inhibitor/angiotensin II receptor blocker is being taken. Eye exam is current.  Hypertension This is a chronic problem. The current episode started more than 1 year ago. The problem has been gradually improving since onset. The problem is controlled. Pertinent negatives include no blurred vision or chest pain.     Past Medical History:  Diagnosis Date   Allergy    Diabetes mellitus without complication (HCC)    diet and exercise controlled   Hypertension    Past Surgical History:  Procedure Laterality Date   DILATION AND CURETTAGE OF UTERUS  2006   16 week stillbirth   IR ANGIOGRAM PELVIS SELECTIVE OR SUPRASELECTIVE  08/31/2019   IR ANGIOGRAM SELECTIVE EACH ADDITIONAL VESSEL  08/31/2019   IR EMBO TUMOR ORGAN ISCHEMIA INFARCT INC GUIDE ROADMAPPING  08/31/2019   IR FLUORO GUIDED NEEDLE PLC ASPIRATION/INJECTION LOC  08/31/2019   IR RADIOLOGIST EVAL & MGMT  07/10/2019   IR RADIOLOGIST EVAL & MGMT  09/18/2019   IR US GUIDE VASC ACCESS RIGHT  08/31/2019     Current Meds  Medication Sig   amLODipine (NORVASC) 10 MG tablet Take 1 tablet (10 mg total) by mouth daily.   diclofenac Sodium (VOLTAREN ARTHRITIS PAIN) 1 % GEL Apply 2 g topically 4 (four) times daily.   Ergocalciferol 50 MCG (2000 UT) CAPS    glucose blood test strip 1 each by Other route 2 (two)  times daily. Use as instructed   olmesartan-hydrochlorothiazide (BENICAR HCT) 40-25 MG tablet Take 1 tablet by mouth daily.   rosuvastatin (CRESTOR) 5 MG tablet Take 1 tablet (5 mg total) by mouth daily.   tirzepatide (MOUNJARO) 10 MG/0.5ML Pen Inject 10 mg into the skin once a week.   [DISCONTINUED] buPROPion (WELLBUTRIN XL) 150 MG 24 hr tablet Take 1 tablet (150 mg total) by mouth every morning.     Allergies:   Patient  has no known allergies.   Social History   Tobacco Use   Smoking status: Never   Smokeless tobacco: Never  Vaping Use   Vaping status: Never Used  Substance Use Topics   Alcohol use: Yes    Comment: occasionally   Drug use: No     Family Hx: The patient's family history includes Cancer (age of onset: 70) in her maternal grandmother; Diabetes in her father; Heart disease in her father; Hyperlipidemia in her mother; Hypertension in her brother, father, and mother; Obesity in an other family member. There is no history of Colon cancer, Colon polyps, Esophageal cancer, Rectal cancer, or Stomach cancer.  ROS:   Please see the history of present illness.    Review of Systems  Constitutional: Negative.   HENT: Negative.    Eyes:  Negative for blurred vision.  Respiratory: Negative.    Cardiovascular:  Negative for chest pain.  Gastrointestinal: Negative.   Genitourinary: Negative.   Neurological: Negative.   Endo/Heme/Allergies: Negative.   Psychiatric/Behavioral: Negative.      All other systems reviewed and are negative.   Labs/Other Tests and Data Reviewed:    Recent Labs: 10/20/2022: ALT 11; BUN 13; Creatinine, Ser 0.74; Hemoglobin 12.3; Platelets 235; Potassium 3.4; Sodium 141   Recent Lipid Panel Lab Results  Component Value Date/Time   CHOL 239 (H) 10/20/2022 08:53 AM   TRIG 44 10/20/2022 08:53 AM   HDL 89 10/20/2022 08:53 AM   CHOLHDL 2.7 10/20/2022 08:53 AM   CHOLHDL 2.7 03/05/2013 02:28 PM   LDLCALC 143 (H) 10/20/2022 08:53 AM    Wt Readings from Last 3 Encounters:  11/18/22 195 lb (88.5 kg)  11/18/22 195 lb (88.5 kg)  10/21/22 198 lb (89.8 kg)     Exam:    Vital Signs:  Ht 5\' 7"  (1.702 m)   Wt 195 lb (88.5 kg)   BMI 30.54 kg/m     Physical Exam  ASSESSMENT & PLAN:     There are no diagnoses linked to this encounter.   COVID-19 Education: The signs and symptoms of COVID-19 were discussed with the patient and how to seek care for testing  (follow up with PCP or arrange E-visit).  The importance of social distancing was discussed today.  Patient Risk:   After full review of this patients clinical status, I feel that they are at least moderate risk at this time.  Time:   Today, I have spent NOT DONE/DUPLICATE minutes/ seconds with the patient with telehealth technology discussing above diagnoses.     Medication Adjustments/Labs and Tests Ordered: Current medicines are reviewed at length with the patient today.  Concerns regarding medicines are outlined above.   Tests Ordered: No orders of the defined types were placed in this encounter.   Medication Changes: No orders of the defined types were placed in this encounter.   Disposition:  Follow up prn  Signed, [name], MD   This encounter was created in error - please disregard.

## 2022-11-19 ENCOUNTER — Other Ambulatory Visit (HOSPITAL_COMMUNITY): Payer: Self-pay

## 2022-11-23 ENCOUNTER — Encounter: Payer: Self-pay | Admitting: Pharmacist

## 2022-11-23 ENCOUNTER — Other Ambulatory Visit (HOSPITAL_COMMUNITY): Payer: Self-pay

## 2022-11-23 ENCOUNTER — Other Ambulatory Visit: Payer: Self-pay

## 2022-11-23 ENCOUNTER — Other Ambulatory Visit: Payer: Self-pay | Admitting: Internal Medicine

## 2022-11-23 MED ORDER — FLUTICASONE PROPIONATE 50 MCG/ACT NA SUSP
2.0000 | Freq: Every day | NASAL | 2 refills | Status: AC
  Filled 2022-11-23 – 2022-12-16 (×2): qty 48, 90d supply, fill #0
  Filled 2023-07-10: qty 48, 90d supply, fill #1
  Filled 2023-10-20: qty 48, 90d supply, fill #2

## 2022-11-26 ENCOUNTER — Other Ambulatory Visit: Payer: Self-pay

## 2022-12-07 ENCOUNTER — Encounter: Payer: Self-pay | Admitting: Internal Medicine

## 2022-12-08 ENCOUNTER — Other Ambulatory Visit: Payer: Self-pay

## 2022-12-08 ENCOUNTER — Other Ambulatory Visit (HOSPITAL_COMMUNITY): Payer: Self-pay

## 2022-12-08 MED ORDER — MOUNJARO 12.5 MG/0.5ML ~~LOC~~ SOAJ
12.5000 mg | SUBCUTANEOUS | 1 refills | Status: DC
Start: 2022-12-08 — End: 2022-12-10
  Filled 2022-12-08: qty 2, 28d supply, fill #0

## 2022-12-10 ENCOUNTER — Other Ambulatory Visit: Payer: Self-pay

## 2022-12-10 ENCOUNTER — Other Ambulatory Visit (HOSPITAL_COMMUNITY): Payer: Self-pay

## 2022-12-10 DIAGNOSIS — E1169 Type 2 diabetes mellitus with other specified complication: Secondary | ICD-10-CM

## 2022-12-10 MED ORDER — MOUNJARO 12.5 MG/0.5ML ~~LOC~~ SOAJ
12.5000 mg | SUBCUTANEOUS | 1 refills | Status: DC
Start: 2022-12-10 — End: 2023-02-07
  Filled 2022-12-10: qty 2, 28d supply, fill #0
  Filled 2023-01-14 (×2): qty 2, 28d supply, fill #1

## 2022-12-16 ENCOUNTER — Other Ambulatory Visit: Payer: Self-pay

## 2022-12-17 ENCOUNTER — Other Ambulatory Visit: Payer: Self-pay

## 2022-12-20 ENCOUNTER — Encounter (HOSPITAL_COMMUNITY): Payer: Commercial Managed Care - PPO

## 2023-01-07 ENCOUNTER — Ambulatory Visit: Payer: Commercial Managed Care - PPO | Admitting: Cardiology

## 2023-01-14 ENCOUNTER — Other Ambulatory Visit (HOSPITAL_COMMUNITY): Payer: Self-pay

## 2023-01-17 ENCOUNTER — Other Ambulatory Visit (HOSPITAL_COMMUNITY): Payer: Self-pay

## 2023-01-26 ENCOUNTER — Telehealth: Payer: Commercial Managed Care - PPO | Admitting: Physician Assistant

## 2023-01-26 ENCOUNTER — Other Ambulatory Visit: Payer: Self-pay

## 2023-01-26 ENCOUNTER — Other Ambulatory Visit (HOSPITAL_COMMUNITY): Payer: Self-pay

## 2023-01-26 DIAGNOSIS — R3989 Other symptoms and signs involving the genitourinary system: Secondary | ICD-10-CM

## 2023-01-26 MED ORDER — CEPHALEXIN 500 MG PO CAPS
500.0000 mg | ORAL_CAPSULE | Freq: Two times a day (BID) | ORAL | 0 refills | Status: AC
  Filled 2023-01-26: qty 14, 7d supply, fill #0

## 2023-01-26 NOTE — Progress Notes (Signed)
I have spent 5 minutes in review of e-visit questionnaire, review and updating patient chart, medical decision making and response to patient.   Mia Milan Cody Jacklynn Dehaas, PA-C    

## 2023-01-26 NOTE — Progress Notes (Signed)

## 2023-02-01 ENCOUNTER — Other Ambulatory Visit (HOSPITAL_COMMUNITY): Payer: Self-pay

## 2023-02-01 MED ORDER — INFLUENZA VIRUS VACC SPLIT PF (FLUZONE) 0.5 ML IM SUSY
0.5000 mL | PREFILLED_SYRINGE | Freq: Once | INTRAMUSCULAR | 0 refills | Status: AC
  Filled 2023-02-01: qty 0.5, 1d supply, fill #0

## 2023-02-07 ENCOUNTER — Other Ambulatory Visit: Payer: Self-pay | Admitting: Internal Medicine

## 2023-02-07 DIAGNOSIS — E1169 Type 2 diabetes mellitus with other specified complication: Secondary | ICD-10-CM

## 2023-02-10 ENCOUNTER — Other Ambulatory Visit: Payer: Self-pay

## 2023-02-10 MED ORDER — MOUNJARO 12.5 MG/0.5ML ~~LOC~~ SOAJ
12.5000 mg | SUBCUTANEOUS | 1 refills | Status: DC
  Filled 2023-02-10: qty 2, 28d supply, fill #0

## 2023-02-22 ENCOUNTER — Other Ambulatory Visit: Payer: Self-pay

## 2023-02-22 ENCOUNTER — Other Ambulatory Visit: Payer: Self-pay | Admitting: Internal Medicine

## 2023-02-22 ENCOUNTER — Other Ambulatory Visit (HOSPITAL_COMMUNITY): Payer: Self-pay

## 2023-02-22 MED ORDER — AMLODIPINE BESYLATE 10 MG PO TABS
10.0000 mg | ORAL_TABLET | Freq: Every day | ORAL | 2 refills | Status: DC
  Filled 2023-02-22: qty 90, 90d supply, fill #0
  Filled 2023-05-31: qty 90, 90d supply, fill #1
  Filled 2023-12-15: qty 90, 90d supply, fill #2

## 2023-02-25 ENCOUNTER — Other Ambulatory Visit: Payer: Self-pay

## 2023-02-28 ENCOUNTER — Other Ambulatory Visit (HOSPITAL_COMMUNITY): Payer: Self-pay

## 2023-03-01 ENCOUNTER — Other Ambulatory Visit (HOSPITAL_COMMUNITY): Payer: Self-pay

## 2023-03-01 ENCOUNTER — Ambulatory Visit: Payer: Commercial Managed Care - PPO | Admitting: Internal Medicine

## 2023-03-01 ENCOUNTER — Encounter: Payer: Self-pay | Admitting: Internal Medicine

## 2023-03-01 ENCOUNTER — Other Ambulatory Visit: Payer: Self-pay

## 2023-03-01 VITALS — BP 124/82 | HR 91 | Temp 98.6°F | Ht 67.0 in | Wt 188.6 lb

## 2023-03-01 DIAGNOSIS — E1169 Type 2 diabetes mellitus with other specified complication: Secondary | ICD-10-CM

## 2023-03-01 DIAGNOSIS — Z87448 Personal history of other diseases of urinary system: Secondary | ICD-10-CM | POA: Insufficient documentation

## 2023-03-01 DIAGNOSIS — E785 Hyperlipidemia, unspecified: Secondary | ICD-10-CM | POA: Diagnosis not present

## 2023-03-01 DIAGNOSIS — I700 Atherosclerosis of aorta: Secondary | ICD-10-CM

## 2023-03-01 DIAGNOSIS — I119 Hypertensive heart disease without heart failure: Secondary | ICD-10-CM

## 2023-03-01 LAB — POCT URINALYSIS DIPSTICK
Bilirubin, UA: NEGATIVE
Glucose, UA: NEGATIVE
Ketones, UA: NEGATIVE
Leukocytes, UA: NEGATIVE
Nitrite, UA: NEGATIVE
Protein, UA: POSITIVE — AB
Spec Grav, UA: 1.025 (ref 1.010–1.025)
Urobilinogen, UA: 0.2 U/dL
pH, UA: 6.5 (ref 5.0–8.0)

## 2023-03-01 MED ORDER — MOUNJARO 15 MG/0.5ML ~~LOC~~ SOAJ
15.0000 mg | SUBCUTANEOUS | 1 refills | Status: DC
  Filled 2023-03-01: qty 2, 28d supply, fill #0
  Filled 2023-04-13: qty 2, 28d supply, fill #1

## 2023-03-01 MED ORDER — VALACYCLOVIR HCL 1 G PO TABS
1000.0000 mg | ORAL_TABLET | Freq: Two times a day (BID) | ORAL | 0 refills | Status: DC | PRN
  Filled 2023-03-01: qty 30, 15d supply, fill #0

## 2023-03-01 NOTE — Assessment & Plan Note (Signed)
She was treated for UTI late October via e-visit. She wants to make sure it has totally resolved.

## 2023-03-01 NOTE — Progress Notes (Signed)
I,Mackenzie Clark, CMA,acting as a Neurosurgeon for Mackenzie Aliment, MD.,have documented all relevant documentation on the behalf of Mackenzie Aliment, MD,as directed by  Mackenzie Aliment, MD while in the presence of Mackenzie Aliment, MD.  Subjective:  Patient ID: Mackenzie Clark , female    DOB: 1965-10-04 , 57 y.o.   MRN: [id number]  Chief Complaint  Patient presents with   Diabetes   Hypertension    HPI  Pt presents today for diabetes & bpc. She reports compliance with medications. Denies headache, chest pain & sob. She has no specific questions or concerns.     Diabetes She presents for her follow-up diabetic visit. She has type 2 diabetes mellitus. Her disease course has been stable. There are no hypoglycemic associated symptoms. Pertinent negatives for diabetes include no blurred vision and no chest pain. There are no hypoglycemic complications. Risk factors for coronary artery disease include diabetes mellitus, hypertension and obesity. She is compliant with treatment most of the time. She participates in exercise intermittently. An ACE inhibitor/angiotensin II receptor blocker is being taken. Eye exam is current.  Hypertension This is a chronic problem. The current episode started more than 1 year ago. The problem has been gradually improving since onset. The problem is controlled. Pertinent negatives include no blurred vision or chest pain.     Past Medical History:  Diagnosis Date   Allergy    Diabetes mellitus without complication (HCC)    diet and exercise controlled   Hypertension      Family History  Problem Relation Age of Onset   Hypertension Mother    Hyperlipidemia Mother    Hypertension Father    Diabetes Father    Heart disease Father        heart attack age 27   Hypertension Brother    Cancer Maternal Grandmother 57       Breast   Obesity Other    Colon cancer Neg Hx    Colon polyps Neg Hx    Esophageal cancer Neg Hx    Rectal cancer Neg Hx    Stomach  cancer Neg Hx      Current Outpatient Medications:    amLODipine (NORVASC) 10 MG tablet, Take 1 tablet (10 mg total) by mouth daily., Disp: 90 tablet, Rfl: 2   buPROPion (WELLBUTRIN XL) 150 MG 24 hr tablet, Take 1 tablet (150 mg total) by mouth every morning., Disp: 90 tablet, Rfl: 1   diclofenac Sodium (VOLTAREN ARTHRITIS PAIN) 1 % GEL, Apply 2 g topically 4 (four) times daily., Disp: 100 g, Rfl: 0   Ergocalciferol 50 MCG (2000 UT) CAPS, , Disp: , Rfl:    fluticasone (FLONASE) 50 MCG/ACT nasal spray, Place 2 sprays into both nostrils daily., Disp: 54 g, Rfl: 2   glucose blood test strip, 1 each by Other route 2 (two) times daily. Use as instructed, Disp: , Rfl:    olmesartan-hydrochlorothiazide (BENICAR HCT) 40-25 MG tablet, Take 1 tablet by mouth daily., Disp: 90 tablet, Rfl: 2   tirzepatide (MOUNJARO) 15 MG/0.5ML Pen, Inject 15 mg into the skin once a week., Disp: 2 mL, Rfl: 1   valACYclovir (VALTREX) 1000 MG tablet, Take one tab twice daily as needed, Disp: 30 tablet, Rfl: 0   rosuvastatin (CRESTOR) 5 MG tablet, Take 1 tablet (5 mg total) by mouth daily., Disp: 90 tablet, Rfl: 3   No Known Allergies   Review of Systems  Constitutional: Negative.   Eyes:  Negative for blurred vision.  Respiratory: Negative.    Cardiovascular: Negative.  Negative for chest pain.  Gastrointestinal: Negative.   Neurological: Negative.   Psychiatric/Behavioral: Negative.       Today's Vitals   03/01/23 0832  BP: 124/82  Pulse: 91  Temp: 98.6 F (37 C)  SpO2: 98%  Weight: 188 lb 9.6 oz (85.5 kg)  Height: 5\' 7"  (1.702 m)   Body mass index is 29.54 kg/m.  Wt Readings from Last 3 Encounters:  03/01/23 188 lb 9.6 oz (85.5 kg)  11/18/22 195 lb (88.5 kg)  11/18/22 195 lb (88.5 kg)     Objective:  Physical Exam Vitals and nursing note reviewed.  Constitutional:      Appearance: Normal appearance.  HENT:     Head: Normocephalic and atraumatic.  Eyes:     Extraocular Movements: Extraocular  movements intact.  Cardiovascular:     Rate and Rhythm: Normal rate and regular rhythm.     Heart sounds: Normal heart sounds.  Pulmonary:     Effort: Pulmonary effort is normal.     Breath sounds: Normal breath sounds.  Musculoskeletal:     Cervical back: Normal range of motion.  Skin:    General: Skin is warm.  Neurological:     General: No focal deficit present.     Mental Status: She is alert.  Psychiatric:        Mood and Affect: Mood normal.        Behavior: Behavior normal.         Assessment And Plan:  Dyslipidemia associated with type 2 diabetes mellitus (HCC) Assessment & Plan: I will increase Mounjaro to 15mg  weekly. She will rto in 4 months for re-evaluation. I will start with 30 day supply, if tolerated, will then start 90 day supply. She was congratulated on her progress thus far.  Orders: -     Lipid panel -     Hemoglobin A1c -     BMP8+eGFR -     ALT -     TSH  Hypertensive heart disease without heart failure Assessment & Plan: Chronic, well controlled. She will continue with olmesartan/hct 40/25mg  daily and amlodipine 10mg  daily. Encouraged to follow low sodium diet.   Orders: -     BMP8+eGFR  Aortic atherosclerosis (HCC) Assessment & Plan: Chronic, LDL goal < 70.  Started on rosuvastatin 5mg  by Cardiology.   Will recheck lipid panel.   Orders: -     Lipid panel -     ALT  History of hematuria Assessment & Plan: She was treated for UTI late October via e-visit. She wants to make sure it has totally resolved.   Orders: -     POCT urinalysis dipstick  Other orders -     Mounjaro; Inject 15 mg into the skin once a week.  Dispense: 2 mL; Refill: 1 -     valACYclovir HCl; Take one tab twice daily as needed  Dispense: 30 tablet; Refill: 0     Return in 4 months (on 06/29/2023).  Patient was given opportunity to ask questions. Patient verbalized understanding of the plan and was able to repeat key elements of the plan. All questions were  answered to their satisfaction.    I, Mackenzie Aliment, MD, have reviewed all documentation for this visit. The documentation on 03/01/23 for the exam, diagnosis, procedures, and orders are all accurate and complete.   IF YOU HAVE BEEN REFERRED TO A SPECIALIST, IT MAY TAKE 1-2 WEEKS TO SCHEDULE/PROCESS THE REFERRAL. IF  YOU HAVE NOT HEARD FROM US/SPECIALIST IN TWO WEEKS, PLEASE GIVE [place] A CALL AT [phone] X [phone].   THE PATIENT IS ENCOURAGED TO PRACTICE SOCIAL DISTANCING DUE TO THE COVID-19 PANDEMIC.

## 2023-03-01 NOTE — Assessment & Plan Note (Addendum)
I will increase Mounjaro to 15mg  weekly. She will rto in 4 months for re-evaluation. I will start with 30 day supply, if tolerated, will then start 90 day supply. She was congratulated on her progress thus far.

## 2023-03-01 NOTE — Assessment & Plan Note (Signed)
Chronic, LDL goal < 70.  Started on rosuvastatin 5mg  by Cardiology.   Will recheck lipid panel.

## 2023-03-01 NOTE — Patient Instructions (Signed)

## 2023-03-01 NOTE — Assessment & Plan Note (Signed)
Chronic, well controlled. She will continue with olmesartan/hct 40/25mg  daily and amlodipine 10mg  daily. Encouraged to follow low sodium diet.

## 2023-03-02 LAB — BMP8+EGFR
BUN/Creatinine Ratio: 15 (ref 9–23)
BUN: 15 mg/dL (ref 6–24)
CO2: 23 mmol/L (ref 20–29)
Calcium: 9.5 mg/dL (ref 8.7–10.2)
Chloride: 105 mmol/L (ref 96–106)
Creatinine, Ser: 0.98 mg/dL (ref 0.57–1.00)
Glucose: 92 mg/dL (ref 70–99)
Potassium: 3.4 mmol/L — ABNORMAL LOW (ref 3.5–5.2)
Sodium: 143 mmol/L (ref 134–144)
eGFR: 69 mL/min/{1.73_m2} (ref >59)

## 2023-03-02 LAB — LIPID PANEL
Chol/HDL Ratio: 2.4 {ratio} (ref 0.0–4.4)
Cholesterol, Total: 181 mg/dL (ref 100–199)
HDL: 77 mg/dL (ref >39)
LDL Chol Calc (NIH): 93 mg/dL (ref 0–99)
Triglycerides: 56 mg/dL (ref 0–149)
VLDL Cholesterol Cal: 11 mg/dL (ref 5–40)

## 2023-03-02 LAB — ALT: ALT: 13 [IU]/L (ref 0–32)

## 2023-03-02 LAB — HEMOGLOBIN A1C
Est. average glucose Bld gHb Est-mCnc: 126 mg/dL
Hgb A1c MFr Bld: 6 % — ABNORMAL HIGH (ref 4.8–5.6)

## 2023-03-02 LAB — TSH: TSH: 1.42 u[IU]/mL (ref 0.450–4.500)

## 2023-03-11 ENCOUNTER — Other Ambulatory Visit (HOSPITAL_COMMUNITY): Payer: Self-pay

## 2023-03-16 ENCOUNTER — Other Ambulatory Visit (HOSPITAL_COMMUNITY): Payer: Self-pay

## 2023-03-16 ENCOUNTER — Other Ambulatory Visit: Payer: Self-pay

## 2023-03-16 ENCOUNTER — Encounter (HOSPITAL_COMMUNITY): Payer: Self-pay

## 2023-03-16 ENCOUNTER — Encounter: Payer: Self-pay | Admitting: Internal Medicine

## 2023-03-25 ENCOUNTER — Ambulatory Visit: Payer: Commercial Managed Care - PPO | Admitting: Family Medicine

## 2023-03-25 DIAGNOSIS — Z1231 Encounter for screening mammogram for malignant neoplasm of breast: Secondary | ICD-10-CM | POA: Diagnosis not present

## 2023-03-25 LAB — HM MAMMOGRAPHY

## 2023-03-29 ENCOUNTER — Encounter: Payer: Self-pay | Admitting: Internal Medicine

## 2023-04-13 ENCOUNTER — Other Ambulatory Visit (HOSPITAL_COMMUNITY): Payer: Self-pay

## 2023-04-19 ENCOUNTER — Ambulatory Visit: Payer: Commercial Managed Care - PPO | Admitting: Obstetrics and Gynecology

## 2023-05-11 ENCOUNTER — Other Ambulatory Visit: Payer: Self-pay

## 2023-05-11 ENCOUNTER — Other Ambulatory Visit: Payer: Self-pay | Admitting: Internal Medicine

## 2023-05-12 ENCOUNTER — Other Ambulatory Visit: Payer: Self-pay

## 2023-05-12 ENCOUNTER — Other Ambulatory Visit (HOSPITAL_COMMUNITY): Payer: Self-pay

## 2023-05-12 ENCOUNTER — Other Ambulatory Visit: Payer: Self-pay | Admitting: Internal Medicine

## 2023-05-12 ENCOUNTER — Encounter: Payer: Self-pay | Admitting: Internal Medicine

## 2023-05-12 MED FILL — Tirzepatide Soln Auto-injector 15 MG/0.5ML: SUBCUTANEOUS | 28 days supply | Qty: 2 | Fill #0 | Status: AC

## 2023-05-12 MED FILL — Tirzepatide Soln Auto-injector 15 MG/0.5ML: SUBCUTANEOUS | 28 days supply | Qty: 2 | Fill #0 | Status: CN

## 2023-05-12 MED FILL — Tirzepatide Soln Auto-injector 15 MG/0.5ML: SUBCUTANEOUS | 28 days supply | Qty: 2 | Fill #1 | Status: CN

## 2023-05-13 ENCOUNTER — Other Ambulatory Visit (HOSPITAL_COMMUNITY): Payer: Self-pay

## 2023-05-19 ENCOUNTER — Other Ambulatory Visit: Payer: Self-pay | Admitting: Orthopedic Surgery

## 2023-05-25 NOTE — Patient Instructions (Signed)
SURGICAL WAITING ROOM VISITATION  Patients having surgery or a procedure may have no more than 2 support people in the waiting area - these visitors may rotate.    Children under the age of 22 must have an adult with them who is not the patient.  Due to an increase in RSV and influenza rates and associated hospitalizations, children ages 26 and under may not visit patients in [facility] hospitals.  Visitors with respiratory illnesses are discouraged from visiting and should remain at home.  If the patient needs to stay at the hospital during part of their recovery, the visitor guidelines for inpatient rooms apply. Pre-op nurse will coordinate an appropriate time for 1 support person to accompany patient in pre-op.  This support person may not rotate.    Please refer to the [facility] website for the visitor guidelines for Inpatients (after your surgery is over and you are in a regular room).       Your procedure is scheduled on: 06-02-23    Report to [facility] Main Entrance    Report to admitting at       0705 AM   Call this number if you have problems the morning of surgery [phone]   Do not eat food :After Midnight.   After Midnight you may have the following liquids until _0620 _____ AM/  DAY OF SURGERY   then nothing by mouth  Water Non-Citrus Juices (without pulp, NO RED-Apple, White grape, White cranberry) Black Coffee (NO MILK/CREAM OR CREAMERS, sugar ok)  Clear Tea (NO MILK/CREAM OR CREAMERS, sugar ok) regular and decaf                             Plain Jell-O (NO RED)                                           Fruit ices (not with fruit pulp, NO RED)                                     Popsicles (NO RED)                                                               Sports drinks like Gatorade (NO RED)                     The day of surgery:  Drink ONE (1) Pre-Surgery G2 by 1478 AM the morning of surgery. Drink in one sitting. Do not sip.  This drink  was given to you during your hospital  pre-op appointment visit. Nothing else to drink after completing the  Pre-Surgery  G2.          If you have questions, please contact your surgeon's office.   FOLLOW BOWEL PREP AND ANY ADDITIONAL PRE OP INSTRUCTIONS YOU RECEIVED FROM YOUR SURGEON'S OFFICE!!!     Oral Hygiene is also important to reduce your risk of infection.  Remember - BRUSH YOUR TEETH THE MORNING OF SURGERY WITH YOUR REGULAR TOOTHPASTE  DENTURES WILL BE REMOVED PRIOR TO SURGERY PLEASE DO NOT APPLY "Poly grip" OR ADHESIVES!!!   Do NOT smoke after Midnight   Stop all vitamins and herbal supplements 7 days before surgery.   Take these medicines the morning of surgery with A SIP OF WATER: ROSUVASTATIN, WELLBUTRIN, AMLODIPINE               Mounjaro hold 1 week prior to surgery  DO NOT TAKE ANY ORAL DIABETIC MEDICATIONS DAY OF YOUR SURGERY  Bring CPAP mask and tubing day of surgery.                              You may not have any metal on your body including hair pins, jewelry, and body piercing             Do not wear make-up, lotions, powders, perfumes/cologne, or deodorant  Do not wear nail polish including gel and S&S, artificial/acrylic nails, or any other type of covering on natural nails including finger and toenails. If you have artificial nails, gel coating, etc. that needs to be removed by a nail salon please have this removed prior to surgery or surgery may need to be canceled/ delayed if the surgeon/ anesthesia feels like they are unable to be safely monitored.   Do not shave  48 hours prior to surgery.       neck.   Do not bring valuables to the hospital. [facility] IS NOT             RESPONSIBLE   FOR VALUABLES.   Contacts, glasses, dentures or bridgework may not be worn into surgery.   Bring small overnight bag day of surgery.   DO NOT BRING YOUR HOME MEDICATIONS TO THE HOSPITAL. PHARMACY WILL DISPENSE MEDICATIONS  LISTED ON YOUR MEDICATION LIST TO YOU DURING YOUR ADMISSION IN THE HOSPITAL!    Patients discharged on the day of surgery will not be allowed to drive home.  Someone NEEDS to stay with you for the first 24 hours after anesthesia.   Special Instructions: Bring a copy of your healthcare power of attorney and living will documents the day of surgery if you haven't scanned them before.              Please read over the following fact sheets you were given: IF YOU HAVE QUESTIONS ABOUT YOUR PRE-OP INSTRUCTIONS PLEASE CALL [phone]    If you test positive for Covid or have been in contact with anyone that has tested positive in the last 10 days please notify you surgeon.    [facility] - Preparing for Surgery Before surgery, you can play an important role.  Because skin is not sterile, your skin needs to be as free of germs as possible.  You can reduce the number of germs on your skin by washing with CHG (chlorahexidine gluconate) soap before surgery.  CHG is an antiseptic cleaner which kills germs and bonds with the skin to continue killing germs even after washing. Please DO NOT use if you have an allergy to CHG or antibacterial soaps.  If your skin becomes reddened/irritated stop using the CHG and inform your nurse when you arrive at Short Stay. Do not shave (including legs and underarms) for at least 48 hours prior to the first CHG shower.  You may shave your face/neck. Please follow these instructions  carefully:  1.  Shower with CHG Soap the night before surgery and the  morning of Surgery.  2.  If you choose to wash your hair, wash your hair first as usual with your  normal  shampoo.  3.  After you shampoo, rinse your hair and body thoroughly to remove the  shampoo.                           4.  Use CHG as you would any other liquid soap.  You can apply chg directly  to the skin and wash                       Gently with a scrungie or clean washcloth.  5.  Apply the CHG Soap to your body ONLY  FROM THE NECK DOWN.   Do not use on face/ open                           Wound or open sores. Avoid contact with eyes, ears mouth and genitals (private parts).                       Wash face,  Genitals (private parts) with your normal soap.             6.  Wash thoroughly, paying special attention to the area where your surgery  will be performed.  7.  Thoroughly rinse your body with warm water from the neck down.  8.  DO NOT shower/wash with your normal soap after using and rinsing off  the CHG Soap.                9.  Pat yourself dry with a clean towel.            10.  Wear clean pajamas.            11.  Place clean sheets on your bed the night of your first shower and do not  sleep with pets. Day of Surgery : Do not apply any lotions/deodorants the morning of surgery.  Please wear clean clothes to the hospital/surgery center.  FAILURE TO FOLLOW THESE INSTRUCTIONS MAY RESULT IN THE CANCELLATION OF YOUR SURGERY PATIENT SIGNATURE_________________________________  NURSE SIGNATURE__________________________________  ________________________________________________________________________

## 2023-05-25 NOTE — Progress Notes (Signed)
 Sent message, via epic in basket, requesting orders in epic from Careers adviser.

## 2023-05-25 NOTE — Progress Notes (Addendum)
PCP - Dorothyann Peng, MD LOV 03-01-23 epic Cardiologist - Thomasene Ripple, DO L[facility] 09-07-22 epic  PPM/ICD -  Device Orders -  Rep Notified -   Chest x-ray -  EKG - 09-07-22 epic Stress Test -  ECHO -  Cardiac Cath -  CT cardiac scoring 07-01-22 epic  Sleep Study -  CPAP -   Fasting Blood Sugar -  Checks Blood Sugar _o___ times a day  Blood Thinner Instructions: Aspirin Instructions:  ERAS Protcol - PRE-SURGERY no orders in epic   COVID vaccine -yes  No orders in at preop  Activity--Able to climb a flight of stair without CP or SOB Anesthesia review: DM II diet controlled , HTN, K+ 2.8  MOUNJARO-   Hold 1 week Last dose- 05-21-23  Patient denies shortness of breath, fever, cough and chest pain at PAT appointment   All instructions explained to the patient, with a verbal understanding of the material. Patient agrees to go over the instructions while at home for a better understanding. Patient also instructed to self quarantine after being tested for COVID-19. The opportunity to ask questions was provided.

## 2023-05-27 ENCOUNTER — Other Ambulatory Visit (HOSPITAL_COMMUNITY): Payer: Self-pay

## 2023-05-27 ENCOUNTER — Other Ambulatory Visit: Payer: Self-pay

## 2023-05-27 ENCOUNTER — Encounter (HOSPITAL_COMMUNITY): Payer: Self-pay

## 2023-05-27 ENCOUNTER — Encounter (HOSPITAL_COMMUNITY)
Admission: RE | Admit: 2023-05-27 | Discharge: 2023-05-27 | Disposition: A | Discharge status: Home / Self Care | Payer: No Typology Code available for payment source | Source: Ambulatory Visit | Attending: Orthopedic Surgery | Admitting: Orthopedic Surgery

## 2023-05-27 VITALS — BP 131/97 | HR 74 | Temp 98.1°F | Resp 16 | Ht 67.0 in | Wt 176.0 lb

## 2023-05-27 DIAGNOSIS — M75111 Incomplete rotator cuff tear or rupture of right shoulder, not specified as traumatic: Secondary | ICD-10-CM | POA: Insufficient documentation

## 2023-05-27 DIAGNOSIS — I700 Atherosclerosis of aorta: Secondary | ICD-10-CM | POA: Diagnosis not present

## 2023-05-27 DIAGNOSIS — E119 Type 2 diabetes mellitus without complications: Secondary | ICD-10-CM | POA: Insufficient documentation

## 2023-05-27 DIAGNOSIS — Z01812 Encounter for preprocedural laboratory examination: Secondary | ICD-10-CM | POA: Insufficient documentation

## 2023-05-27 DIAGNOSIS — M19011 Primary osteoarthritis, right shoulder: Secondary | ICD-10-CM | POA: Insufficient documentation

## 2023-05-27 DIAGNOSIS — I119 Hypertensive heart disease without heart failure: Secondary | ICD-10-CM | POA: Diagnosis not present

## 2023-05-27 HISTORY — DX: Unspecified osteoarthritis, unspecified site: M19.90

## 2023-05-27 LAB — BASIC METABOLIC PANEL
Anion gap: 12 (ref 5–15)
BUN: 12 mg/dL (ref 6–20)
CO2: 26 mmol/L (ref 22–32)
Calcium: 10 mg/dL (ref 8.9–10.3)
Chloride: 101 mmol/L (ref 98–111)
Creatinine, Ser: 0.84 mg/dL (ref 0.44–1.00)
GFR, Estimated: >60 mL/min (ref >60)
Glucose, Bld: 107 mg/dL — ABNORMAL HIGH (ref 70–99)
Potassium: 2.8 mmol/L — ABNORMAL LOW (ref 3.5–5.1)
Sodium: 139 mmol/L (ref 135–145)

## 2023-05-27 LAB — GLUCOSE, CAPILLARY
Glucose-Capillary: 42 mg/dL — CL (ref 70–99)
Glucose-Capillary: 77 mg/dL (ref 70–99)

## 2023-05-27 LAB — CBC
HCT: 40.2 % (ref 36.0–46.0)
Hemoglobin: 13.4 g/dL (ref 12.0–15.0)
MCH: 29.6 pg (ref 26.0–34.0)
MCHC: 33.3 g/dL (ref 30.0–36.0)
MCV: 88.7 fL (ref 80.0–100.0)
Platelets: 260 10*3/uL (ref 150–400)
RBC: 4.53 MIL/uL (ref 3.87–5.11)
RDW: 13.4 % (ref 11.5–15.5)
WBC: 3.8 10*3/uL — ABNORMAL LOW (ref 4.0–10.5)
nRBC: 0 % (ref 0.0–0.2)

## 2023-05-27 LAB — HEMOGLOBIN A1C
Hgb A1c MFr Bld: 5.3 % (ref 4.8–5.6)
Mean Plasma Glucose: 105.41 mg/dL

## 2023-05-27 MED ORDER — POTASSIUM CHLORIDE 20 MEQ PO PACK
PACK | ORAL | 0 refills | Status: DC
  Filled 2023-05-27: qty 20, 10d supply, fill #0

## 2023-05-30 NOTE — Progress Notes (Signed)
 Case: [id number] Date/Time: 06/02/23 0907   Procedure: SHOULDER ARTHROSCOPY ROTATOR CUFF DEBRIDEMENT VERSUS ROTATOR CUFF REPAIR WITH SUBACROMIAL DECOMPRESSION AND DISTAL CLAVICLE EXCISION (Right) - REQUEST   Anesthesia type: Choice   Pre-op diagnosis: RIGHT SHOULDER PARTIAL ROTATOR CUFF TEAR AND ACROMIOCLAVICULAR ARTHROPATHY   Location: [facility] ROOM 07 / [facility]   Surgeons: Jones Broom, MD       DISCUSSION: Mackenzie Clark is a 58 year old female who presents to PAT prior to surgery above.  Past medical history significant for hypertension, aortic atherosclerosis, diabetes, arthritis.  Patient follows with her PCP for chronic medical conditions.  Blood pressure and diabetes have been controlled.  Last A1c was 5.3 on 05/27/2023.  Of note her potassium was low at 2.8.  She states that she was called in potassium supplementation and is currently taking it twice daily.  Will repeat i-STAT day of surgery.  MOUNJARO-   Hold 1 week Last dose- 05-21-23  VS: BP (!) 131/97   Pulse 74   Temp 36.7 C (Oral)   Resp 16   Ht 5\' 7"  (1.702 m)   Wt 79.8 kg   LMP  (Approximate) Comment: Been 6 months  SpO2 100%   BMI 27.57 kg/m   PROVIDERS: Dorothyann Peng, MD   LABS: Labs reviewed: Repeat I stat DOS (all labs ordered are listed, but only abnormal results are displayed)  Labs Reviewed  BASIC METABOLIC PANEL - Abnormal; Notable for the following components:      Result Value   Potassium 2.8 (*)    Glucose, Bld 107 (*)    All other components within normal limits  CBC - Abnormal; Notable for the following components:   WBC 3.8 (*)    All other components within normal limits  GLUCOSE, CAPILLARY - Abnormal; Notable for the following components:   Glucose-Capillary 42 (*)    All other components within normal limits  HEMOGLOBIN A1C  GLUCOSE, CAPILLARY     IMAGES:   EKG:   CV:  CT calcium score 07/01/2022:  IMPRESSION: 1. Coronary calcium score of 0. This was 1st percentile  for age, gender, and race matched controls.   2. Aortic atherosclerosis.  Past Medical History:  Diagnosis Date   Allergy    Arthritis    r shoulder   Diabetes mellitus without complication (HCC)    diet and exercise controlled   Hypertension     Past Surgical History:  Procedure Laterality Date   DILATION AND CURETTAGE OF UTERUS  04/05/2004   16 week stillbirth   IR ANGIOGRAM PELVIS SELECTIVE OR SUPRASELECTIVE  08/31/2019   IR ANGIOGRAM SELECTIVE EACH ADDITIONAL VESSEL  08/31/2019   IR EMBO TUMOR ORGAN ISCHEMIA INFARCT INC GUIDE ROADMAPPING  08/31/2019   IR FLUORO GUIDED NEEDLE PLC ASPIRATION/INJECTION LOC  08/31/2019   IR RADIOLOGIST EVAL & MGMT  07/10/2019   IR RADIOLOGIST EVAL & MGMT  09/18/2019   IR US GUIDE VASC ACCESS RIGHT  08/31/2019   TONSILLECTOMY     and adnoids    MEDICATIONS:  amLODipine (NORVASC) 10 MG tablet   Bioflavonoid Products (VITAMIN C) CHEW   buPROPion (WELLBUTRIN XL) 150 MG 24 hr tablet   diclofenac Sodium (VOLTAREN ARTHRITIS PAIN) 1 % GEL   fluticasone (FLONASE) 50 MCG/ACT nasal spray   glucose blood test strip   olmesartan-hydrochlorothiazide (BENICAR HCT) 40-25 MG tablet   potassium chloride (KLOR-CON) 20 MEQ packet   POTASSIUM PO   rosuvastatin (CRESTOR) 5 MG tablet   tirzepatide (MOUNJARO) 15 MG/0.5ML Pen  valACYclovir (VALTREX) 1000 MG tablet   No current facility-administered medications for this encounter.   Marcille Blanco [facility] Short Stay/Anesthesiology [facility] Phone [phone] 05/30/2023 11:00 AM

## 2023-05-30 NOTE — Anesthesia Preprocedure Evaluation (Signed)
 Anesthesia Evaluation  Patient identified by MRN, date of birth, ID band Patient awake    Reviewed: Allergy & Precautions, NPO status , Patient's Chart, lab work & pertinent test results, reviewed documented beta blocker date and time   History of Anesthesia Complications Negative for: history of anesthetic complications  Airway Mallampati: II  TM Distance: >3 FB     Dental no notable dental hx.    Pulmonary neg COPD, neg PE   breath sounds clear to auscultation       Cardiovascular hypertension, (-) CAD, (-) Past MI, (-) Cardiac Stents and (-) CABG  Rhythm:Regular Rate:Normal     Neuro/Psych neg Seizures PSYCHIATRIC DISORDERS Anxiety        GI/Hepatic ,neg GERD  ,,(+) neg Cirrhosis        Endo/Other  diabetes, Type 2    Renal/GU Renal disease     Musculoskeletal  (+) Arthritis ,    Abdominal   Peds  Hematology   Anesthesia Other Findings   Reproductive/Obstetrics                             Anesthesia Physical Anesthesia Plan  ASA: 2  Anesthesia Plan: General   Post-op Pain Management: Regional block*   Induction: Intravenous  PONV Risk Score and Plan: 2 and Ondansetron and Dexamethasone  Airway Management Planned: Oral ETT  Additional Equipment:   Intra-op Plan:   Post-operative Plan: Extubation in OR  Informed Consent: I have reviewed the patients History and Physical, chart, labs and discussed the procedure including the risks, benefits and alternatives for the proposed anesthesia with the patient or authorized representative who has indicated his/her understanding and acceptance.     Dental advisory given  Plan Discussed with: CRNA  Anesthesia Plan Comments: (See PAT note from 2/21 by Sherlie Ban PA-C )        Anesthesia Quick Evaluation

## 2023-05-31 ENCOUNTER — Other Ambulatory Visit (HOSPITAL_COMMUNITY): Payer: Self-pay

## 2023-05-31 ENCOUNTER — Other Ambulatory Visit: Payer: Self-pay

## 2023-05-31 ENCOUNTER — Other Ambulatory Visit: Payer: Self-pay | Admitting: Internal Medicine

## 2023-05-31 MED ORDER — BUPROPION HCL ER (XL) 150 MG PO TB24
150.0000 mg | ORAL_TABLET | ORAL | 1 refills | Status: DC
Start: 2023-05-31 — End: 2023-12-15
  Filled 2023-05-31: qty 90, 90d supply, fill #0
  Filled 2023-09-19: qty 90, 90d supply, fill #1

## 2023-06-02 ENCOUNTER — Encounter (HOSPITAL_COMMUNITY): Payer: Self-pay | Admitting: Orthopedic Surgery

## 2023-06-02 ENCOUNTER — Other Ambulatory Visit: Payer: Self-pay

## 2023-06-02 ENCOUNTER — Encounter (HOSPITAL_COMMUNITY)
Admission: RE | Disposition: A | Discharge status: Home / Self Care | Payer: Self-pay | Source: Ambulatory Visit | Attending: Orthopedic Surgery

## 2023-06-02 ENCOUNTER — Ambulatory Visit (HOSPITAL_COMMUNITY)
Admission: RE | Admit: 2023-06-02 | Discharge: 2023-06-02 | Disposition: A | Discharge status: Home / Self Care | Payer: No Typology Code available for payment source | Source: Ambulatory Visit | Attending: Orthopedic Surgery | Admitting: Orthopedic Surgery

## 2023-06-02 ENCOUNTER — Other Ambulatory Visit (HOSPITAL_COMMUNITY): Payer: Self-pay

## 2023-06-02 ENCOUNTER — Ambulatory Visit (HOSPITAL_COMMUNITY): Payer: Self-pay | Admitting: Medical

## 2023-06-02 ENCOUNTER — Ambulatory Visit (HOSPITAL_BASED_OUTPATIENT_CLINIC_OR_DEPARTMENT_OTHER): Payer: Self-pay | Admitting: Medical

## 2023-06-02 DIAGNOSIS — Y990 Civilian activity done for income or pay: Secondary | ICD-10-CM | POA: Diagnosis not present

## 2023-06-02 DIAGNOSIS — F419 Anxiety disorder, unspecified: Secondary | ICD-10-CM | POA: Insufficient documentation

## 2023-06-02 DIAGNOSIS — Z833 Family history of diabetes mellitus: Secondary | ICD-10-CM | POA: Diagnosis not present

## 2023-06-02 DIAGNOSIS — M75101 Unspecified rotator cuff tear or rupture of right shoulder, not specified as traumatic: Secondary | ICD-10-CM | POA: Diagnosis not present

## 2023-06-02 DIAGNOSIS — I10 Essential (primary) hypertension: Secondary | ICD-10-CM

## 2023-06-02 DIAGNOSIS — Z7985 Long-term (current) use of injectable non-insulin antidiabetic drugs: Secondary | ICD-10-CM | POA: Diagnosis not present

## 2023-06-02 DIAGNOSIS — S4991XA Unspecified injury of right shoulder and upper arm, initial encounter: Secondary | ICD-10-CM | POA: Insufficient documentation

## 2023-06-02 DIAGNOSIS — M25811 Other specified joint disorders, right shoulder: Secondary | ICD-10-CM | POA: Insufficient documentation

## 2023-06-02 DIAGNOSIS — M1990 Unspecified osteoarthritis, unspecified site: Secondary | ICD-10-CM | POA: Insufficient documentation

## 2023-06-02 DIAGNOSIS — E119 Type 2 diabetes mellitus without complications: Secondary | ICD-10-CM

## 2023-06-02 DIAGNOSIS — S46011A Strain of muscle(s) and tendon(s) of the rotator cuff of right shoulder, initial encounter: Secondary | ICD-10-CM | POA: Insufficient documentation

## 2023-06-02 DIAGNOSIS — Z8249 Family history of ischemic heart disease and other diseases of the circulatory system: Secondary | ICD-10-CM | POA: Insufficient documentation

## 2023-06-02 DIAGNOSIS — I119 Hypertensive heart disease without heart failure: Secondary | ICD-10-CM

## 2023-06-02 DIAGNOSIS — M12811 Other specific arthropathies, not elsewhere classified, right shoulder: Secondary | ICD-10-CM | POA: Diagnosis not present

## 2023-06-02 DIAGNOSIS — X58XXXA Exposure to other specified factors, initial encounter: Secondary | ICD-10-CM | POA: Diagnosis not present

## 2023-06-02 DIAGNOSIS — E876 Hypokalemia: Secondary | ICD-10-CM

## 2023-06-02 HISTORY — PX: OTHER SURGICAL HISTORY: SHX169

## 2023-06-02 LAB — POCT I-STAT, CHEM 8
BUN: 12 mg/dL (ref 6–20)
Calcium, Ion: 1.17 mmol/L (ref 1.15–1.40)
Chloride: 106 mmol/L (ref 98–111)
Creatinine, Ser: 0.9 mg/dL (ref 0.44–1.00)
Glucose, Bld: 92 mg/dL (ref 70–99)
HCT: 34 % — ABNORMAL LOW (ref 36.0–46.0)
Hemoglobin: 11.6 g/dL — ABNORMAL LOW (ref 12.0–15.0)
Potassium: 3.7 mmol/L (ref 3.5–5.1)
Sodium: 140 mmol/L (ref 135–145)
TCO2: 26 mmol/L (ref 22–32)

## 2023-06-02 LAB — GLUCOSE, CAPILLARY
Glucose-Capillary: 80 mg/dL (ref 70–99)
Glucose-Capillary: 83 mg/dL (ref 70–99)

## 2023-06-02 LAB — HCG, SERUM, QUALITATIVE: Preg, Serum: NEGATIVE

## 2023-06-02 SURGERY — SHOULDER ARTHROSCOPY WITH SUBACROMIAL DECOMPRESSION AND DISTAL CLAVICLE EXCISION
Anesthesia: General | Laterality: Right

## 2023-06-02 MED ORDER — FENTANYL CITRATE PF 50 MCG/ML IJ SOSY: 25.0000 ug | PREFILLED_SYRINGE | INTRAMUSCULAR | Status: DC | PRN

## 2023-06-02 MED ORDER — ACETAMINOPHEN 10 MG/ML IV SOLN: 1000.0000 mg | Freq: Once | INTRAVENOUS | Status: DC | PRN

## 2023-06-02 MED ORDER — LACTATED RINGERS IV SOLN: INTRAVENOUS | Status: DC

## 2023-06-02 MED ORDER — SUGAMMADEX SODIUM 200 MG/2ML IV SOLN
INTRAVENOUS | Status: AC
  Filled 2023-06-02: qty 2

## 2023-06-02 MED ORDER — CEFAZOLIN SODIUM-DEXTROSE 2-4 GM/100ML-% IV SOLN
2.0000 g | INTRAVENOUS | Status: AC
  Administered 2023-06-02: 2 g via INTRAVENOUS
  Filled 2023-06-02: qty 100

## 2023-06-02 MED ORDER — FENTANYL CITRATE (PF) 100 MCG/2ML IJ SOLN
INTRAMUSCULAR | Status: DC | PRN
  Administered 2023-06-02 (×2): 50 ug via INTRAVENOUS

## 2023-06-02 MED ORDER — OXYCODONE HCL 5 MG PO TABS
5.0000 mg | ORAL_TABLET | Freq: Four times a day (QID) | ORAL | 0 refills | Status: DC | PRN
  Filled 2023-06-02 (×2): qty 30, 8d supply, fill #0

## 2023-06-02 MED ORDER — FENTANYL CITRATE PF 50 MCG/ML IJ SOSY
100.0000 ug | PREFILLED_SYRINGE | Freq: Once | INTRAMUSCULAR | Status: DC
  Filled 2023-06-02: qty 2

## 2023-06-02 MED ORDER — DEXAMETHASONE SODIUM PHOSPHATE 10 MG/ML IJ SOLN
INTRAMUSCULAR | Status: DC | PRN
  Administered 2023-06-02: 8 mg via INTRAVENOUS

## 2023-06-02 MED ORDER — ONDANSETRON HCL 4 MG/2ML IJ SOLN: 4.0000 mg | Freq: Once | INTRAMUSCULAR | Status: DC | PRN

## 2023-06-02 MED ORDER — PROPOFOL 10 MG/ML IV BOLUS
INTRAVENOUS | Status: DC | PRN
  Administered 2023-06-02: 120 mg via INTRAVENOUS

## 2023-06-02 MED ORDER — PHENYLEPHRINE 80 MCG/ML (10ML) SYRINGE FOR IV PUSH (FOR BLOOD PRESSURE SUPPORT)
PREFILLED_SYRINGE | INTRAVENOUS | Status: AC
  Filled 2023-06-02: qty 10

## 2023-06-02 MED ORDER — SODIUM CHLORIDE 0.9 % IR SOLN
Status: DC | PRN
  Administered 2023-06-02: 3000 mL

## 2023-06-02 MED ORDER — BUPIVACAINE LIPOSOME 1.3 % IJ SUSP
INTRAMUSCULAR | Status: DC | PRN
  Administered 2023-06-02: 10 mL

## 2023-06-02 MED ORDER — INSULIN ASPART 100 UNIT/ML IJ SOLN
0.0000 [IU] | INTRAMUSCULAR | Status: DC | PRN
Start: 2023-06-02 — End: 2023-06-02

## 2023-06-02 MED ORDER — ROCURONIUM BROMIDE 10 MG/ML (PF) SYRINGE
PREFILLED_SYRINGE | INTRAVENOUS | Status: AC
  Filled 2023-06-02: qty 10

## 2023-06-02 MED ORDER — PHENYLEPHRINE 80 MCG/ML (10ML) SYRINGE FOR IV PUSH (FOR BLOOD PRESSURE SUPPORT)
PREFILLED_SYRINGE | INTRAVENOUS | Status: DC | PRN
  Administered 2023-06-02 (×4): 80 ug via INTRAVENOUS

## 2023-06-02 MED ORDER — OXYCODONE HCL 5 MG/5ML PO SOLN: 5.0000 mg | Freq: Once | ORAL | Status: DC | PRN

## 2023-06-02 MED ORDER — OXYCODONE HCL 5 MG PO TABS: 5.0000 mg | ORAL_TABLET | Freq: Once | ORAL | Status: DC | PRN

## 2023-06-02 MED ORDER — CHLORHEXIDINE GLUCONATE 0.12 % MT SOLN
15.0000 mL | Freq: Once | OROMUCOSAL | Status: AC
  Administered 2023-06-02: 15 mL via OROMUCOSAL

## 2023-06-02 MED ORDER — EPHEDRINE 5 MG/ML INJ
INTRAVENOUS | Status: AC
  Filled 2023-06-02: qty 5

## 2023-06-02 MED ORDER — ROCURONIUM BROMIDE 100 MG/10ML IV SOLN
INTRAVENOUS | Status: DC | PRN
Start: 2023-06-02 — End: 2023-06-02
  Administered 2023-06-02: 70 mg via INTRAVENOUS

## 2023-06-02 MED ORDER — ONDANSETRON HCL 4 MG/2ML IJ SOLN
INTRAMUSCULAR | Status: AC
  Filled 2023-06-02: qty 2

## 2023-06-02 MED ORDER — BUPIVACAINE HCL (PF) 0.5 % IJ SOLN
INTRAMUSCULAR | Status: DC | PRN
  Administered 2023-06-02: 10 mL

## 2023-06-02 MED ORDER — MIDAZOLAM HCL 2 MG/2ML IJ SOLN
INTRAMUSCULAR | Status: AC
  Filled 2023-06-02: qty 2

## 2023-06-02 MED ORDER — FENTANYL CITRATE (PF) 100 MCG/2ML IJ SOLN
INTRAMUSCULAR | Status: AC
  Filled 2023-06-02: qty 2

## 2023-06-02 MED ORDER — MIDAZOLAM HCL 2 MG/2ML IJ SOLN
2.0000 mg | Freq: Once | INTRAMUSCULAR | Status: AC
  Administered 2023-06-02: 2 mg via INTRAVENOUS
  Filled 2023-06-02: qty 2

## 2023-06-02 MED ORDER — LIDOCAINE HCL (PF) 2 % IJ SOLN
INTRAMUSCULAR | Status: AC
  Filled 2023-06-02: qty 5

## 2023-06-02 MED ORDER — ONDANSETRON HCL 4 MG/2ML IJ SOLN
INTRAMUSCULAR | Status: DC | PRN
  Administered 2023-06-02: 4 mg via INTRAVENOUS

## 2023-06-02 MED ORDER — PROPOFOL 10 MG/ML IV BOLUS
INTRAVENOUS | Status: AC
  Filled 2023-06-02: qty 20

## 2023-06-02 MED ORDER — ORAL CARE MOUTH RINSE: 15.0000 mL | Freq: Once | OROMUCOSAL | Status: AC

## 2023-06-02 MED ORDER — DEXAMETHASONE SODIUM PHOSPHATE 10 MG/ML IJ SOLN
INTRAMUSCULAR | Status: AC
  Filled 2023-06-02: qty 1

## 2023-06-02 MED ORDER — EPHEDRINE SULFATE-NACL 50-0.9 MG/10ML-% IV SOSY
PREFILLED_SYRINGE | INTRAVENOUS | Status: DC | PRN
  Administered 2023-06-02 (×2): 5 mg via INTRAVENOUS

## 2023-06-02 MED ORDER — SUGAMMADEX SODIUM 200 MG/2ML IV SOLN
INTRAVENOUS | Status: DC | PRN
Start: 2023-06-02 — End: 2023-06-02
  Administered 2023-06-02 (×2): 200 mg via INTRAVENOUS

## 2023-06-02 MED ORDER — DEXMEDETOMIDINE HCL IN NACL 80 MCG/20ML IV SOLN
INTRAVENOUS | Status: DC | PRN
  Administered 2023-06-02: 4 ug via INTRAVENOUS
  Administered 2023-06-02: 8 ug via INTRAVENOUS

## 2023-06-02 SURGICAL SUPPLY — 53 items
BLADE CLIPPER SURG (BLADE) IMPLANT
BLADE SHAVER BONE 5.0X13 (MISCELLANEOUS) ×1 IMPLANT
BURR OVAL 8 FLU 4.0X13 (MISCELLANEOUS) ×1 IMPLANT
CANNULA 5.75X7 CRYSTAL CLEAR (CANNULA) ×1 IMPLANT
CANNULA TWIST IN 8.25X7CM (CANNULA) IMPLANT
CHLORAPREP W/TINT 26 (MISCELLANEOUS) ×1 IMPLANT
CUTTER BONE 4.0MM X 13CM (MISCELLANEOUS) ×1 IMPLANT
DRAPE IMP U-DRAPE 54X76 (DRAPES) ×1 IMPLANT
DRAPE INCISE IOBAN 66X45 STRL (DRAPES) ×1 IMPLANT
DRAPE ORTHO 2.5IN SPLIT 77X108 (DRAPES) ×2 IMPLANT
DRAPE STERI 35X30 U-POUCH (DRAPES) ×1 IMPLANT
DRAPE SURG 17X23 STRL (DRAPES) ×1 IMPLANT
DRAPE U-SHAPE 47X51 STRL (DRAPES) ×1 IMPLANT
ELECT REM PT RETURN 15FT ADLT (MISCELLANEOUS) ×1 IMPLANT
GAUZE 4X4 16PLY ~~LOC~~+RFID DBL (SPONGE) IMPLANT
GAUZE PAD ABD 8X10 STRL (GAUZE/BANDAGES/DRESSINGS) ×1 IMPLANT
GAUZE SPONGE 4X4 12PLY STRL (GAUZE/BANDAGES/DRESSINGS) ×1 IMPLANT
GAUZE XEROFORM 1X8 LF (GAUZE/BANDAGES/DRESSINGS) ×1 IMPLANT
GLOVE BIO SURGEON STRL SZ7.5 (GLOVE) ×1 IMPLANT
GLOVE BIOGEL PI IND STRL 6 (GLOVE) ×1 IMPLANT
GLOVE BIOGEL PI IND STRL 8 (GLOVE) ×1 IMPLANT
GLOVE SURG SS PI 6.5 STRL IVOR (GLOVE) ×1 IMPLANT
GOWN STRL REUS W/ TWL LRG LVL3 (GOWN DISPOSABLE) ×2 IMPLANT
KIT BASIN OR (CUSTOM PROCEDURE TRAY) ×1 IMPLANT
LASSO CRESCENT QUICKPASS (SUTURE) IMPLANT
MANIFOLD NEPTUNE II (INSTRUMENTS) ×1 IMPLANT
NDL HD SCORPION MEGA LOADER (NEEDLE) IMPLANT
NS IRRIG 1000ML POUR BTL (IV SOLUTION) IMPLANT
PACK ARTHROSCOPY DSU (CUSTOM PROCEDURE TRAY) ×1 IMPLANT
PROBE BIPOLAR ATHRO 135MM 90D (MISCELLANEOUS) ×1 IMPLANT
RESTRAINT HEAD UNIVERSAL NS (MISCELLANEOUS) ×1 IMPLANT
SHEET MEDIUM DRAPE 40X70 STRL (DRAPES) IMPLANT
SLING ARM FOAM STRAP LRG (SOFTGOODS) IMPLANT
SLING ARM FOAM STRAP MED (SOFTGOODS) IMPLANT
SPIKE FLUID TRANSFER (MISCELLANEOUS) IMPLANT
SPONGE T-LAP 4X18 ~~LOC~~+RFID (SPONGE) IMPLANT
SUCTION TUBE FRAZIER 10FR DISP (SUCTIONS) IMPLANT
SUPPORT WRAP ARM LG (MISCELLANEOUS) IMPLANT
SUT ETHILON 3 0 PS 1 (SUTURE) IMPLANT
SUT ETHILON 4 0 PS 2 18 (SUTURE) ×1 IMPLANT
SUT FIBERWIRE #2 38 T-5 BLUE (SUTURE) IMPLANT
SUT PDS AB 0 CT 36 (SUTURE) IMPLANT
SUT PROLENE 3 0 PS 2 (SUTURE) IMPLANT
SUT TIGER TAPE 7 IN WHITE (SUTURE) IMPLANT
SUTURE FIBERWR #2 38 T-5 BLUE (SUTURE) IMPLANT
SUTURE TAPE 1.3 40 TPR END (SUTURE) IMPLANT
SUTURETAPE 1.3 40 TPR END (SUTURE) IMPLANT
TAPE CLOTH SURG 4X10 WHT LF (GAUZE/BANDAGES/DRESSINGS) IMPLANT
TAPE FIBER 2MM 7IN #2 BLUE (SUTURE) IMPLANT
TOWEL GREEN STERILE FF (TOWEL DISPOSABLE) ×1 IMPLANT
TUBING ARTHROSCOPY IRRIG 16FT (MISCELLANEOUS) ×1 IMPLANT
TUBING CONNECTING 10 (TUBING) ×1 IMPLANT
WATER STERILE IRR 1000ML POUR (IV SOLUTION) ×1 IMPLANT

## 2023-06-02 NOTE — Anesthesia Procedure Notes (Addendum)
 Anesthesia Regional Block: Interscalene brachial plexus block   Pre-Anesthetic Checklist: , timeout performed,  Correct Patient, Correct Site, Correct Laterality,  Correct Procedure, Correct Position, site marked,  Risks and benefits discussed,  Surgical consent,  Pre-op evaluation,  At surgeon's request and post-op pain management  Laterality: Right  Prep: Maximum Sterile Barrier Precautions used, chloraprep       Needles:  Injection technique: Single-shot  Needle Type: Echogenic Needle     Needle Length: 5cm  Needle Gauge: 22     Additional Needles:   Procedures:,,,, ultrasound used (permanent image in chart),,    Narrative:  Start time: 06/02/2023 9:20 AM End time: 06/02/2023 9:23 AM Injection made incrementally with aspirations every 5 mL.  Performed by: Personally  Anesthesiologist: Mariann Barter, MD

## 2023-06-02 NOTE — Anesthesia Procedure Notes (Signed)
 Procedure Name: Intubation Date/Time: 06/02/2023 9:48 AM  Performed by: Lezlie Lye, CRNAPre-anesthesia Checklist: Patient identified, Emergency Drugs available, Suction available and Patient being monitored Patient Re-evaluated:Patient Re-evaluated prior to induction Oxygen Delivery Method: Circle system utilized Preoxygenation: Pre-oxygenation with 100% oxygen Induction Type: IV induction Ventilation: Mask ventilation without difficulty Laryngoscope Size: Miller and 3 Grade View: Grade I Tube type: Oral Tube size: 7.5 mm Number of attempts: 1 Airway Equipment and Method: Stylet Placement Confirmation: ETT inserted through vocal cords under direct vision, positive ETCO2 and breath sounds checked- equal and bilateral Secured at: 22 cm Tube secured with: Tape Dental Injury: Teeth and Oropharynx as per pre-operative assessment

## 2023-06-02 NOTE — Transfer of Care (Signed)
 Immediate Anesthesia Transfer of Care Note  Patient: Mackenzie Clark  Procedure(s) Performed: RIGHT SHOULDER ARTHROSCOPY ROTATOR CUFF DEBRIDEMENT WITH SUBACROMIAL DECOMPRESSION AND DISTAL CLAVICLE EXCISION (Right)  Patient Location: PACU  Anesthesia Type:General and Regional  Level of Consciousness: awake, drowsy, and patient cooperative  Airway & Oxygen Therapy: Patient Spontanous Breathing and Patient connected to face mask oxygen  Post-op Assessment: Report given to RN and Post -op Vital signs reviewed and stable  Post vital signs: Reviewed and stable  Last Vitals:  Vitals Value Taken Time  BP 98/54 06/02/23 1045  Temp 97   Pulse 75 06/02/23 1048  Resp 22 06/02/23 1048  SpO2 100 % 06/02/23 1048  Vitals shown include unfiled device data.  Last Pain:  Vitals:   06/02/23 0803  TempSrc:   PainSc: 0-No pain         Complications: No notable events documented.

## 2023-06-02 NOTE — Op Note (Signed)
 Procedure(s): RIGHT SHOULDER ARTHROSCOPY ROTATOR CUFF DEBRIDEMENT WITH SUBACROMIAL DECOMPRESSION AND DISTAL CLAVICLE EXCISION Procedure Note  Jordy Hewins female 58 y.o. 06/02/2023  Preoperative diagnosis: #1 right shoulder partial-thickness rotator cuff tear #2 right shoulder impingement with unfavorable acromial anatomy #3 right shoulder symptomatic AC internal derangement  Postoperative diagnosis: Same  Procedure performed: #1 right shoulder arthroscopic extensive debridement of partial rotator cuff tear, SLAP tear, subacromial bursitis #2 right shoulder arthroscopic subacromial decompression #3 right shoulder arthroscopic distal clavicle excision  Surgeons and Role:    Jones Broom, MD - Primary     Surgeon: Glennon Hamilton   Assistants: Fredia Sorrow PA-C Amber was present and scrubbed throughout the procedure and was essential in positioning, assisting with the camera and instrumentation,, and closure)  Anesthesia: General endotracheal anesthesia with preoperative interscalene block given by the attending anesthesiologist     Procedure Detail  RIGHT SHOULDER ARTHROSCOPY ROTATOR CUFF DEBRIDEMENT WITH SUBACROMIAL DECOMPRESSION AND DISTAL CLAVICLE EXCISION  Estimated Blood Loss: Min         Drains: none  Blood Given: none         Specimens: none        Complications:  * No complications entered in OR log *         Disposition: PACU - hemodynamically stable.         Condition: stable    Procedure:   INDICATIONS FOR SURGERY: The patient is 58 y.o. female who has had a history of right shoulder pain since an injury at work.  Pain is attributable to partial tearing of the rotator cuff, impingement, AC joint internal derangement, failed conservative measures.  Indicated for surgical treatment to try and decrease pain and restore function.  OPERATIVE FINDINGS: Examination under anesthesia: No stiffness or instability   DESCRIPTION OF PROCEDURE:  The patient was identified in preoperative  holding area where I personally marked the operative site after  verifying site, side, and procedure with the patient. An interscalene block was given by the attending anesthesiologist the holding area.  The patient was taken back to the operating room where general anesthesia was induced without complication and was placed in the beach-chair position with the back  elevated about 60 degrees and all extremities and head and neck carefully padded and  positioned.   The right upper extremity was then prepped and  draped in a standard sterile fashion. The appropriate time-out  procedure was carried out. The patient did receive IV antibiotics  within 30 minutes of incision.   A small posterior portal incision was made and the arthroscope was introduced into the joint. An anterior portal was then established above the subscapularis using needle localization. Small cannula was placed anteriorly. Diagnostic arthroscopy was then carried out.  There was noted to be extensive flap tearing of the superior labrum without detachment of the biceps origin.  This was debrided back to healthy labrum.  The biceps tendon was pulled into the joint and noted to be intact and healthy appearing.  Joint surfaces were intact without significant chondromalacia.  Anterior posterior labrum were intact.  Subscapularis was intact and healthy.  The undersurface of the supraspinatus was examined and noted to have 10 to 15% partial articular sided tearing with extensive fraying.  This was debrided back to healthy tendon.  Formal repair was not necessary.  The arthroscope was then introduced into the subacromial space a standard lateral portal was established with needle localization. The shaver was used through the lateral portal to perform  extensive bursectomy. Coracoacromial ligament was examined and found to be frayed indicating impingement.  The bursal surface of the rotator cuff  was carefully examined and noted to have mild fraying in the area of impingement but no exposed tuberosity.  This was debrided back to healthy tendon.  The coracoacromial ligament was taken down off the anterior acromion with the [employer]thro[employer] exposing a hooked anterior acromial spur. A high-speed bur was then used through the lateral portal to take down the anterior acromial spur from lateral to medial in a standard acromioplasty.  The acromioplasty was also viewed from the lateral portal and the bur was used as necessary to ensure that the acromion was completely flat from posterior to anterior.  The distal clavicle was exposed arthroscopically and the bur was used to take off the undersurface for approximately 8 mm from the lateral portal. The bur was then moved to an anterior portal position to complete the distal clavicle excision resecting about 8 mm of the distal clavicle and a smooth even fashion. This was viewed from anterior and lateral portals and felt to be complete.  The arthroscopic equipment was removed from the joint and the portals were closed with 3-0 nylon in an interrupted fashion. Sterile dressings were then applied including Xeroform 4 x 4's ABDs and tape. The patient was then allowed to awaken from general anesthesia, placed in a sling, transferred to the stretcher and taken to the recovery room in stable condition.   POSTOPERATIVE PLAN: The patient will be discharged home today and will followup in one week for suture removal and wound check.

## 2023-06-02 NOTE — H&P (Signed)
 Mackenzie Clark is an 58 y.o. female.   Chief Complaint: R shoulder pain  HPI: R shoulder partial thinckness RCT and AC joint internal derangement after injury at work.  Failed conservative treatment.  Past Medical History:  Diagnosis Date   Allergy    Arthritis    r shoulder   Diabetes mellitus without complication (HCC)    diet and exercise controlled   Hypertension     Past Surgical History:  Procedure Laterality Date   DILATION AND CURETTAGE OF UTERUS  04/05/2004   16 week stillbirth   IR ANGIOGRAM PELVIS SELECTIVE OR SUPRASELECTIVE  08/31/2019   IR ANGIOGRAM SELECTIVE EACH ADDITIONAL VESSEL  08/31/2019   IR EMBO TUMOR ORGAN ISCHEMIA INFARCT INC GUIDE ROADMAPPING  08/31/2019   IR FLUORO GUIDED NEEDLE PLC ASPIRATION/INJECTION LOC  08/31/2019   IR RADIOLOGIST EVAL & MGMT  07/10/2019   IR RADIOLOGIST EVAL & MGMT  09/18/2019   IR US GUIDE VASC ACCESS RIGHT  08/31/2019   TONSILLECTOMY     and adnoids    Family History  Problem Relation Age of Onset   Hypertension Mother    Hyperlipidemia Mother    Hypertension Father    Diabetes Father    Heart disease Father        heart attack age 57   Hypertension Brother    Cancer Maternal Grandmother 72       Breast   Obesity Other    Colon cancer Neg Hx    Colon polyps Neg Hx    Esophageal cancer Neg Hx    Rectal cancer Neg Hx    Stomach cancer Neg Hx    Social History:  reports that she has never smoked. She has never used smokeless tobacco. She reports current alcohol use. She reports that she does not use drugs.  Allergies:  Allergies  Allergen Reactions   Shellfish Allergy Swelling    Medications Prior to Admission  Medication Sig Dispense Refill   amLODipine (NORVASC) 10 MG tablet Take 1 tablet (10 mg total) by mouth daily. 90 tablet 2   Bioflavonoid Products (VITAMIN C) CHEW Chew 1 tablet by mouth 3 (three) times a week.     buPROPion (WELLBUTRIN XL) 150 MG 24 hr tablet Take 1 tablet (150 mg total) by mouth every  morning. 90 tablet 1   diclofenac Sodium (VOLTAREN ARTHRITIS PAIN) 1 % GEL Apply 2 g topically 4 (four) times daily. (Patient taking differently: Apply 2 g topically 4 (four) times daily as needed (pain).) 100 g 0   fluticasone (FLONASE) 50 MCG/ACT nasal spray Place 2 sprays into both nostrils daily. (Patient taking differently: Place 2 sprays into both nostrils daily as needed for allergies.) 54 g 2   olmesartan-hydrochlorothiazide (BENICAR HCT) 40-25 MG tablet Take 1 tablet by mouth daily. 90 tablet 2   potassium chloride (KLOR-CON) 20 MEQ packet Mix the contents of 1 packet in water and drink by mouth twice a day 20 packet 0   POTASSIUM PO Take 1 tablet by mouth 2 (two) times a week.     rosuvastatin (CRESTOR) 5 MG tablet Take 1 tablet (5 mg total) by mouth daily. 90 tablet 3   tirzepatide (MOUNJARO) 15 MG/0.5ML Pen Inject 15 mg into the skin once a week. 2 mL 1   glucose blood test strip 1 each by Other route 2 (two) times daily. Use as instructed     valACYclovir (VALTREX) 1000 MG tablet Take 1 tablet (1,000 mg total) by mouth 2 (two) times  daily as needed. 30 tablet 0    Results for orders placed or performed during the hospital encounter of 06/02/23 (from the past 48 hours)  Glucose, capillary     Status: None   Collection Time: 06/02/23  7:40 AM  Result Value Ref Range   Glucose-Capillary 80 70 - 99 mg/dL    Comment: Glucose reference range applies only to samples taken after fasting for at least 8 hours.   Comment 1 Notify RN   I-STAT, chem 8     Status: Abnormal   Collection Time: 06/02/23  8:23 AM  Result Value Ref Range   Sodium 140 135 - 145 mmol/L   Potassium 3.7 3.5 - 5.1 mmol/L   Chloride 106 98 - 111 mmol/L   BUN 12 6 - 20 mg/dL   Creatinine, Ser 5.62 0.44 - 1.00 mg/dL   Glucose, Bld 92 70 - 99 mg/dL    Comment: Glucose reference range applies only to samples taken after fasting for at least 8 hours.   Calcium, Ion 1.17 1.15 - 1.40 mmol/L   TCO2 26 22 - 32 mmol/L    Hemoglobin 11.6 (L) 12.0 - 15.0 g/dL   HCT 13.0 (L) 86.5 - 78.4 %   No results found.  Review of Systems  All other systems reviewed and are negative.   Blood pressure 129/84, pulse 88, temperature 97.9 F (36.6 C), temperature source Oral, resp. rate 16, height 5\' 7"  (1.702 m), weight 79.8 kg, SpO2 100%. Physical Exam HENT:     Head: Atraumatic.  Eyes:     Extraocular Movements: Extraocular movements intact.  Cardiovascular:     Pulses: Normal pulses.  Pulmonary:     Effort: Pulmonary effort is normal.  Musculoskeletal:     Comments: R shoulder pain with R testing, TTP at [facility] joint.  Neurological:     Mental Status: She is alert.  Psychiatric:        Mood and Affect: Mood normal.      Assessment/Plan R shoulder partial thinckness RCT and AC joint internal derangement after injury at work.  Failed conservative treatment. Plan R shoulder arthroscopic RCR vs RCD, SAD, DCE. Risks / benefits of surgery discussed Consent on chart  NPO for OR Preop antibiotics   Glennon Hamilton, MD 06/02/2023, 8:37 AM

## 2023-06-02 NOTE — Discharge Instructions (Signed)
Discharge Instructions after Arthroscopic Shoulder Surgery   A sling has been provided for you. You may remove the sling after 72 hours. The sling may be worn for your protection, if you are in a crowd.  Use ice on the shoulder intermittently over the first 48 hours after surgery.  Pain medication has been prescribed for you.  Use your medication liberally over the first 48 hours, and then begin to taper your use. You may take Extra Strength Tylenol or Tylenol only in place of the pain pills. DO NOT take ANY nonsteroidal anti-inflammatory pain medications: Advil, Motrin, Ibuprofen, Aleve, Naproxen, or Naprosyn.  You may remove your dressing after two days.  You may shower 5 days after surgery. The incision CANNOT get wet prior to 5 days. Simply allow the water to wash over the site and then pat dry. Do not rub the incision. Make sure your axilla (armpit) is completely dry after showering.  Take one aspirin a day for 2 weeks after surgery, unless you have an aspirin sensitivity/allergy or asthma.  Three to 5 times each day you should perform assisted overhead reaching and external rotation (outward turning) exercises with the operative arm. Both exercises should be done with the non-operative arm used as the "therapist arm" while the operative arm remains relaxed. Ten of each exercise should be done three to five times each day.    Overhead reach is helping to lift your stiff arm up as high as it will go. To stretch your overhead reach, lie flat on your back, relax, and grasp the wrist of the tight shoulder with your opposite hand. Using the power in your opposite arm, bring the stiff arm up as far as it is comfortable. Start holding it for ten seconds and then work up to where you can hold it for a count of 30. Breathe slowly and deeply while the arm is moved. Repeat this stretch ten times, trying to help the arm up a little higher each time.       External rotation is turning the arm out to  the side while your elbow stays close to your body. External rotation is best stretched while you are lying on your back. Hold a cane, yardstick, broom handle, or dowel in both hands. Bend both elbows to a right angle. Use steady, gentle force from your normal arm to rotate the hand of the stiff shoulder out away from your body. Continue the rotation as far as it will go comfortably, holding it there for a count of 10. Repeat this exercise ten times.     Please call [phone] during normal business hours or [phone] after hours for any problems. Including the following:  - excessive redness of the incisions - drainage for more than 4 days - fever of more than 101.5 F  *Please note that pain medications will not be refilled after hours or on weekends.    

## 2023-06-03 NOTE — Anesthesia Postprocedure Evaluation (Signed)
 Anesthesia Post Note  Patient: Elliot Simoneaux Roeper  Procedure(s) Performed: RIGHT SHOULDER ARTHROSCOPY ROTATOR CUFF DEBRIDEMENT WITH SUBACROMIAL DECOMPRESSION AND DISTAL CLAVICLE EXCISION (Right)     Patient location during evaluation: PACU Anesthesia Type: General Level of consciousness: awake and alert Pain management: pain level controlled Vital Signs Assessment: post-procedure vital signs reviewed and stable Respiratory status: spontaneous breathing, nonlabored ventilation, respiratory function stable and patient connected to nasal cannula oxygen Cardiovascular status: blood pressure returned to baseline and stable Postop Assessment: no apparent nausea or vomiting Anesthetic complications: no   No notable events documented.  Last Vitals:  Vitals:   06/02/23 1145 06/02/23 1147  BP: 107/78 104/78  Pulse: 60 66  Resp: 12 14  Temp: (!) 36.4 C 36.5 C  SpO2: 96% 97%    Last Pain:  Vitals:   06/02/23 1147  TempSrc:   PainSc: 0-No pain                 Mariann Barter

## 2023-06-13 MED FILL — Tirzepatide Soln Auto-injector 15 MG/0.5ML: SUBCUTANEOUS | 28 days supply | Qty: 2 | Fill #1 | Status: AC

## 2023-06-14 ENCOUNTER — Other Ambulatory Visit (HOSPITAL_COMMUNITY): Payer: Self-pay

## 2023-06-14 NOTE — Therapy (Signed)
 OUTPATIENT PHYSICAL THERAPY SHOULDER EVALUATION   Patient Name: Mackenzie Clark MRN: [id number] DOB:03/30/1966, 58 y.o., female Today's Date: 06/15/2023  END OF SESSION:  PT End of Session - 06/15/23 1019     Visit Number 1    Number of Visits 17    Date for PT Re-Evaluation 08/10/23    Authorization Type workers comp    Authorization Time Period Approved 1 evaluation and 12 therapy visits    Authorization - Number of Visits 12    PT Start Time 1020   late check in   PT Stop Time 1052    PT Time Calculation (min) 32 min    Activity Tolerance Patient tolerated treatment well             Past Medical History:  Diagnosis Date   Allergy    Arthritis    r shoulder   Diabetes mellitus without complication (HCC)    diet and exercise controlled   Hypertension    Past Surgical History:  Procedure Laterality Date   DILATION AND CURETTAGE OF UTERUS  04/05/2004   16 week stillbirth   IR ANGIOGRAM PELVIS SELECTIVE OR SUPRASELECTIVE  08/31/2019   IR ANGIOGRAM SELECTIVE EACH ADDITIONAL VESSEL  08/31/2019   IR EMBO TUMOR ORGAN ISCHEMIA INFARCT INC GUIDE ROADMAPPING  08/31/2019   IR FLUORO GUIDED NEEDLE PLC ASPIRATION/INJECTION LOC  08/31/2019   IR RADIOLOGIST EVAL & MGMT  07/10/2019   IR RADIOLOGIST EVAL & MGMT  09/18/2019   IR US GUIDE VASC ACCESS RIGHT  08/31/2019   TONSILLECTOMY     and adnoids   Patient Active Problem List   Diagnosis Date Noted   History of hematuria 03/01/2023   Encounter for general adult medical examination w/o abnormal findings 11/01/2022   Aortic atherosclerosis (HCC) 11/01/2022   Anxiety 11/01/2022   Vitamin D deficiency disease 11/01/2022   Irregular menses 11/01/2022   Sprain of right ankle 11/01/2022   Pure hypercholesterolemia 05/06/2022   Overweight with body mass index (BMI) of 29 to 29.9 in adult 01/22/2021   Dyslipidemia associated with type 2 diabetes mellitus (HCC) 01/22/2021   Fibroids 08/31/2019   Abnormal uterine bleeding due to  intramural leiomyoma 08/31/2019   Abnormal uterine bleeding (AUB) 05/21/2019   Uterine leiomyoma 05/21/2019   Body mass index (BMI) of 30.0-30.9 in adult 09/03/2017   Type II diabetes mellitus, uncontrolled 09/03/2017   Obesity 09/03/2017   Hypertensive heart disease 03/05/2013   Surveillance of previously prescribed contraceptive pill 03/05/2013    PCP: Dorothyann Peng, MD  REFERRING PROVIDER: Jones Broom, MD  REFERRING DIAG: S/P RIGHT SHOULDER SAD , DCE , CUFF DEBRIDEMENT  THERAPY DIAG:  Right shoulder pain, unspecified chronicity  Muscle weakness (generalized)  Rationale for Evaluation and Treatment: Rehabilitation  ONSET DATE: R shoulder cuff debridement, SAD, DCE DOS 06/02/23  SUBJECTIVE:  SUBJECTIVE STATEMENT: Pt states she is having some shoulder soreness, states follow up with surgeon went well. Pt states she is out of work right now, denies formal restrictions. States she has limitations with ADLs as below, limited primarily by soreness. No N/T, no significant swelling, no redness, no fevers/chills, no drainage from portals.  Hand dominance: Left  PERTINENT HISTORY: HTN, DM2  PAIN:  Are you having pain: no resting pain, soreness with movement Location/description: R shoulder Best-worst over past week: 0-9/10  - aggravating factors: upper body dressing, tying shoes, washing back, doing hair, sleeping - Easing factors: brace, ice pack     PRECAUTIONS: post op R shoulder ; per paper referral from physician okay for PROM/AAROM/AROM, strengthening   RED FLAGS: None   WEIGHT BEARING RESTRICTIONS: No  FALLS:  Has patient fallen in last 6 months? No  LIVING ENVIRONMENT: 2 story home, no issues with stairs, lives with son . 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  DIAGNOSTIC FINDINGS:  DOS 06/02/23 R shoulder SAD, DCE, cuff debridement  PATIENT SURVEYS:  Patient-specific activity scoring scheme (Point to one number):  "0" represents "unable to perform." "10" represents "able to perform at prior level. 0 1 2 3 4 5 6 7 8 9  10 (Date and Score) Activity Initial  Activity Eval     Fasten bra  5    Hair (wash)  0    Wash back 5   Tie shoes 5   Reach into cabinets 4    Total score = sum of the activity scores/number of activities Minimum detectable change (90%CI) for average score = 2 points Minimum detectable change (90%CI) for single activity score = 3 points Score: 19  COGNITION: Overall cognitive status: Within functional limits for tasks assessed     SENSATION: Denies sensory complaints  POSTURE: Mild guarding RUE  UPPER EXTREMITY ROM:  A/PROM Right eval Left eval  Shoulder flexion A: 109 deg  162  Shoulder abduction A: 128 deg   Shoulder internal rotation     Shoulder external rotation     Elbow flexion    Elbow extension    Wrist flexion    Wrist extension     (Blank rows = not tested) (Key: WFL = within functional limits not formally assessed, * = concordant pain, s = stiffness/stretching sensation, NT = not tested)  Comments: PROM testing limited by muscle guarding  UPPER EXTREMITY MMT:  MMT Right eval Left eval  Shoulder flexion    Shoulder extension    Shoulder abduction    Shoulder extension    Shoulder internal rotation    Shoulder external rotation    Elbow flexion    Elbow extension    Grip strength    (Blank rows = not tested)  (Key: WFL = within functional limits not formally assessed, * = concordant pain, s = stiffness/stretching sensation, NT =  not tested)  Comments: deferred given proximity to surgery and pain w/ active mobility  PALPATION/OBSERVATION:  3 portal incisions all well appearing - no redness, swelling, drainage  TREATMENT DATE:  [facility] Adult PT Treatment:                                                DATE: 06/15/23 Therapeutic Exercise: Shoulder flexion AAROM supine x8 short lever Shoulder chest press AAROM hands clasped x5 Scapular retraction x10 cradle position HEP handout + education    PATIENT EDUCATION: Education details: Pt education on PT impairments, prognosis, and POC. Informed consent. Rationale for interventions, safe/appropriate HEP performance Person educated: Patient Education method: Explanation, Demonstration, Tactile cues, Verbal cues Education comprehension: verbalized understanding, returned demonstration, verbal cues required, tactile cues required, and needs further education    HOME EXERCISE PROGRAM: Access Code: [email or web address] URL: [email or web address] Date: 06/15/2023 Prepared by: Fransisco Hertz  Program Notes - with shoulder press, please perform with hands clasped as done in clinic  Exercises - Seated Scapular Retraction  - 2-3 x daily - 1 sets - 6-8 reps - Supine Shoulder Flexion AAROM  - 2-3 x daily - 1 sets - 6-8 reps - Supine Shoulder Press  - 2-3 x daily - 1 sets - 6-8 reps  ASSESSMENT:  CLINICAL IMPRESSION: Patient is a pleasant 58 y.o. woman who was seen today for physical therapy evaluation and treatment for R shoulder SAD, DCE and cuff debridement DOS 06/02/23, denies formal surgical restrictions. Endorses difficulty with ADLs and housework as expected post op, general soreness when using shoulder. On exam she demonstrates impaired GH strength/mobility as expected post op, PROM assessment limited primarily by muscle guarding. Requires  cues/education for appropriate HEP performance, able to tolerate with mild soreness but no significant increases in pain. No adverse events. Recommend skilled PT to address aforementioned deficits with aim of improving tolerance to daily tasks and progress back to occupational tasks. Pt departs today's session in no acute distress, all voiced questions/concerns addressed appropriately from PT perspective.      OBJECTIVE IMPAIRMENTS: decreased activity tolerance, decreased endurance, decreased mobility, decreased ROM, decreased strength, impaired perceived functional ability, impaired UE functional use, and pain.   ACTIVITY LIMITATIONS: carrying, lifting, bathing, dressing, reach over head, and hygiene/grooming  PARTICIPATION LIMITATIONS: meal prep, cleaning, laundry, community activity, and occupation  PERSONAL FACTORS: Time since onset of injury/illness/exacerbation and 1-2 comorbidities: DM, HTN  are also affecting patient's functional outcome.   REHAB POTENTIAL: Good  CLINICAL DECISION MAKING: Stable/uncomplicated  EVALUATION COMPLEXITY: Low   GOALS:  SHORT TERM GOALS: Target date: 07/13/2023  Pt will demonstrate appropriate understanding and performance of initially prescribed HEP in order to facilitate improved independence with management of symptoms.  Baseline: HEP established  Goal status: INITIAL   2. Pt will report at least 25% improvement in overall pain levels over past week in order to facilitate improved tolerance to typical daily activities.   Baseline: 0-9/10  Goal status: INITIAL    LONG TERM GOALS: Target date: 08/10/2023 Pt will score greater than 28 on PSFS in order to facilitate improved perception of function.   Baseline: 19   Goal status: INITIAL  2.  Pt will demonstrate at least 150 degrees of active R shoulder elevation in order to demonstrate improved tolerance to functional movement patterns such as reaching overhead and into cabinets.  Baseline: see ROM  chart above Goal status: INITIAL  3.  Pt will demonstrate at least 4+/5 shoulder flex/abd MMT for improved symmetry of UE strength and improved tolerance to functional  movements.  Baseline: NT on eval Goal status: INITIAL  4. Pt will report at least 50% decrease in overall pain levels in past week in order to facilitate improved tolerance to basic ADLs/mobility.   Baseline: 0-9/10  Goal status: INITIAL     5. Pt will report ability to perform upper and lower body dressing with less than 3 pt increase in pain in order to facilitate improved tolerance to ADLs.  Baseline: inc pain with LBD/UBD  Goal status: INITIAL  6. Pt will be able to move 5# from waist to shoulder height for 5 repetitions in order to indicate improved functional shoulder endurance for work tasks.  Baseline: avoiding lifting tasks  Goal status: INITIAL   PLAN:  PT FREQUENCY: 1-2x/week  PT DURATION: 8 weeks  PLANNED INTERVENTIONS: 97164- PT Re-evaluation, 97110-Therapeutic exercises, 97530- Therapeutic activity, 97112- Neuromuscular re-education, 97535- Self Care, 16109- Manual therapy, [id number]- Electrical stimulation (unattended), Patient/Family education, Balance training, Stair training, Taping, Dry Needling, Joint mobilization, Spinal mobilization, Scar mobilization, Cryotherapy, and Moist heat  PLAN FOR NEXT SESSION: Review/update HEP PRN. Work on [employer] exercises as appropriate with emphasis on shoulder PROM/AAROM initially, reducing muscle guarding. Periscapular activation and postural endurance within pt tolerance. Symptom modification strategies as indicated/appropriate.    Ashley Murrain PT, DPT 06/15/2023 12:34 PM

## 2023-06-15 ENCOUNTER — Other Ambulatory Visit: Payer: Self-pay

## 2023-06-15 ENCOUNTER — Encounter: Payer: Self-pay | Admitting: Physical Therapy

## 2023-06-15 ENCOUNTER — Ambulatory Visit: Attending: Orthopedic Surgery | Admitting: Physical Therapy

## 2023-06-15 DIAGNOSIS — M6281 Muscle weakness (generalized): Secondary | ICD-10-CM | POA: Diagnosis present

## 2023-06-15 DIAGNOSIS — M25511 Pain in right shoulder: Secondary | ICD-10-CM | POA: Diagnosis present

## 2023-06-21 ENCOUNTER — Ambulatory Visit

## 2023-06-21 DIAGNOSIS — M6281 Muscle weakness (generalized): Secondary | ICD-10-CM

## 2023-06-21 DIAGNOSIS — M25511 Pain in right shoulder: Secondary | ICD-10-CM

## 2023-06-21 NOTE — Therapy (Signed)
 OUTPATIENT PHYSICAL THERAPY TREATMENT NOTE   Patient Name: Mackenzie Clark MRN: [id number] DOB:12/23/1965, 58 y.o., female Today's Date: 06/21/2023  END OF SESSION:  PT End of Session - 06/21/23 1530     Visit Number 2    Number of Visits 17    Date for PT Re-Evaluation 08/10/23    Authorization Type workers comp    Authorization Time Period Approved 1 evaluation and 12 therapy visits    Authorization - Visit Number 1    Authorization - Number of Visits 12    PT Start Time 1530    PT Stop Time 1608    PT Time Calculation (min) 38 min    Activity Tolerance Patient tolerated treatment well    Behavior During Therapy WFL for tasks assessed/performed              Past Medical History:  Diagnosis Date   Allergy    Arthritis    r shoulder   Diabetes mellitus without complication (HCC)    diet and exercise controlled   Hypertension    Past Surgical History:  Procedure Laterality Date   DILATION AND CURETTAGE OF UTERUS  04/05/2004   16 week stillbirth   IR ANGIOGRAM PELVIS SELECTIVE OR SUPRASELECTIVE  08/31/2019   IR ANGIOGRAM SELECTIVE EACH ADDITIONAL VESSEL  08/31/2019   IR EMBO TUMOR ORGAN ISCHEMIA INFARCT INC GUIDE ROADMAPPING  08/31/2019   IR FLUORO GUIDED NEEDLE PLC ASPIRATION/INJECTION LOC  08/31/2019   IR RADIOLOGIST EVAL & MGMT  07/10/2019   IR RADIOLOGIST EVAL & MGMT  09/18/2019   IR US GUIDE VASC ACCESS RIGHT  08/31/2019   TONSILLECTOMY     and adnoids   Patient Active Problem List   Diagnosis Date Noted   History of hematuria 03/01/2023   Encounter for general adult medical examination w/o abnormal findings 11/01/2022   Aortic atherosclerosis (HCC) 11/01/2022   Anxiety 11/01/2022   Vitamin D deficiency disease 11/01/2022   Irregular menses 11/01/2022   Sprain of right ankle 11/01/2022   Pure hypercholesterolemia 05/06/2022   Overweight with body mass index (BMI) of 29 to 29.9 in adult 01/22/2021   Dyslipidemia associated with type 2 diabetes  mellitus (HCC) 01/22/2021   Fibroids 08/31/2019   Abnormal uterine bleeding due to intramural leiomyoma 08/31/2019   Abnormal uterine bleeding (AUB) 05/21/2019   Uterine leiomyoma 05/21/2019   Body mass index (BMI) of 30.0-30.9 in adult 09/03/2017   Type II diabetes mellitus, uncontrolled 09/03/2017   Obesity 09/03/2017   Hypertensive heart disease 03/05/2013   Surveillance of previously prescribed contraceptive pill 03/05/2013    PCP: Dorothyann Peng, MD  REFERRING PROVIDER: Jones Broom, MD  REFERRING DIAG: S/P RIGHT SHOULDER SAD , DCE , CUFF DEBRIDEMENT  THERAPY DIAG:  Right shoulder pain, unspecified chronicity  Muscle weakness (generalized)  Acute pain of right shoulder  Rationale for Evaluation and Treatment: Rehabilitation  ONSET DATE: R shoulder cuff debridement, SAD, DCE DOS 06/02/23  Next MD appt: Next month per pt report  SUBJECTIVE:  SUBJECTIVE STATEMENT: Patient reports 7/10 pain in her shoulder today, has been compliant with HEP.  EVAL: Pt states she is having some shoulder soreness, states follow up with surgeon went well. Pt states she is out of work right now, denies formal restrictions. States she has limitations with ADLs as below, limited primarily by soreness. No N/T, no significant swelling, no redness, no fevers/chills, no drainage from portals.  Hand dominance: Left  PERTINENT HISTORY: HTN, DM2  PAIN:  Are you having pain: no resting pain, soreness with movement Location/description: R shoulder Best-worst over past week: 0-9/10  - aggravating factors: upper body dressing, tying shoes, washing back, doing hair, sleeping - Easing factors: brace, ice pack     PRECAUTIONS: post op R shoulder ; per paper referral from physician okay for PROM/AAROM/AROM, strengthening    RED FLAGS: None   WEIGHT BEARING RESTRICTIONS: No  FALLS:  Has patient fallen in last 6 months? No  LIVING ENVIRONMENT: 2 story home, no issues with stairs, lives with son . 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  DIAGNOSTIC FINDINGS:  DOS 06/02/23 R shoulder SAD, DCE, cuff debridement  PATIENT SURVEYS:  Patient-specific activity scoring scheme (Point to one number):  "0" represents "unable to perform." "10" represents "able to perform at prior level. 0 1 2 3 4 5 6 7 8 9  10 (Date and Score) Activity Initial  Activity Eval     Fasten bra  5    Hair (wash)  0    Wash back 5   Tie shoes 5   Reach into cabinets 4    Total score = sum of the activity scores/number of activities Minimum detectable change (90%CI) for average score = 2 points Minimum detectable change (90%CI) for single activity score = 3 points Score: 19  COGNITION: Overall cognitive status: Within functional limits for tasks assessed     SENSATION: Denies sensory complaints  POSTURE: Mild guarding RUE  UPPER EXTREMITY ROM:  A/PROM Right eval Left eval  Shoulder flexion A: 109 deg  162  Shoulder abduction A: 128 deg   Shoulder internal rotation     Shoulder external rotation     Elbow flexion    Elbow extension    Wrist flexion    Wrist extension     (Blank rows = not tested) (Key: WFL = within functional limits not formally assessed, * = concordant pain, s = stiffness/stretching sensation, NT = not tested)  Comments: PROM testing limited by muscle guarding  UPPER EXTREMITY MMT:  MMT Right eval Left eval  Shoulder flexion    Shoulder extension    Shoulder abduction    Shoulder extension    Shoulder internal  rotation    Shoulder external rotation    Elbow flexion    Elbow extension    Grip strength    (Blank rows = not tested)  (Key: WFL = within functional limits not formally assessed, * = concordant pain, s = stiffness/stretching sensation, NT = not tested)  Comments: deferred given proximity to surgery and pain w/ active mobility  PALPATION/OBSERVATION:  3 portal incisions all well appearing - no redness, swelling, drainage  TREATMENT DATE:  [facility] Adult PT Treatment:                                                DATE: 06/21/23 Therapeutic Exercise: Seated scapular retraction 2x10 Seated ER with dowel 2x10 Supine shoulder flexion with dowel AAROM 2x10 Supine chest press with dowel AAROM 2x10 Supine shoulder flexion AROM Rt 2x10 Supine chest press AROM Rt 2x10 Supine isometrics ER/IR/Abd/flex/ext 5" hold x10 ea Finger ladder AAROM flexion/abduction x5 ea - cues to decrease compensation of shoulder elevation   [facility] Adult PT Treatment:                                                DATE: 06/15/23 Therapeutic Exercise: Shoulder flexion AAROM supine x8 short lever Shoulder chest press AAROM hands clasped x5 Scapular retraction x10 cradle position HEP handout + education   PATIENT EDUCATION: Education details: Pt education on PT impairments, prognosis, and POC. Informed consent. Rationale for interventions, safe/appropriate HEP performance Person educated: Patient Education method: Explanation, Demonstration, Tactile cues, Verbal cues Education comprehension: verbalized understanding, returned demonstration, verbal cues required, tactile cues required, and needs further education    HOME EXERCISE PROGRAM: Access Code: [email or web address] URL: [email or web address] Date: 06/15/2023 Prepared by: Fransisco Hertz  Program Notes - with shoulder press, please  perform with hands clasped as done in clinic  Exercises - Seated Scapular Retraction  - 2-3 x daily - 1 sets - 6-8 reps - Supine Shoulder Flexion AAROM  - 2-3 x daily - 1 sets - 6-8 reps - Supine Shoulder Press  - 2-3 x daily - 1 sets - 6-8 reps  ASSESSMENT:  CLINICAL IMPRESSION: Patient presents to first follow up appointment reporting continued pain in her shoulder, HEP compliance, and that her sleep has improved with less pain. Session today focused on AAROM -> AROM as well as isometric strengthening. She had the most difficulty with AROM, displaying decreased range from [facility] and increased pain, but was able to complete all exercises. Patient was able to tolerate all prescribed exercises with no adverse effects. Patient continues to benefit from skilled PT services and should be progressed as able to improve functional independence.   EVAL: Patient is a pleasant 58 y.o. woman who was seen today for physical therapy evaluation and treatment for R shoulder SAD, DCE and cuff debridement DOS 06/02/23, denies formal surgical restrictions. Endorses difficulty with ADLs and housework as expected post op, general soreness when using shoulder. On exam she demonstrates impaired GH strength/mobility as expected post op, PROM assessment limited primarily by muscle guarding. Requires cues/education for appropriate HEP performance, able to tolerate with mild soreness but no significant increases in pain. No adverse events. Recommend skilled PT to address aforementioned deficits with aim of improving tolerance to daily tasks and progress back to occupational tasks. Pt departs today's session in no acute distress, all voiced questions/concerns addressed appropriately from PT perspective.       OBJECTIVE IMPAIRMENTS: decreased activity tolerance, decreased endurance, decreased mobility, decreased ROM, decreased strength, impaired perceived functional ability, impaired UE functional use, and pain.   ACTIVITY  LIMITATIONS: carrying, lifting, bathing, dressing, reach over head, and hygiene/grooming  PARTICIPATION LIMITATIONS: meal prep, cleaning, laundry, community activity, and occupation  PERSONAL FACTORS:  Time since onset of injury/illness/exacerbation and 1-2 comorbidities: DM, HTN  are also affecting patient's functional outcome.   REHAB POTENTIAL: Good  CLINICAL DECISION MAKING: Stable/uncomplicated  EVALUATION COMPLEXITY: Low   GOALS:  SHORT TERM GOALS: Target date: 07/13/2023  Pt will demonstrate appropriate understanding and performance of initially prescribed HEP in order to facilitate improved independence with management of symptoms.  Baseline: HEP established  Goal status: INITIAL   2. Pt will report at least 25% improvement in overall pain levels over past week in order to facilitate improved tolerance to typical daily activities.   Baseline: 0-9/10  Goal status: INITIAL  LONG TERM GOALS: Target date: 08/10/2023 Pt will score greater than 28 on PSFS in order to facilitate improved perception of function.   Baseline: 19   Goal status: INITIAL  2.  Pt will demonstrate at least 150 degrees of active R shoulder elevation in order to demonstrate improved tolerance to functional movement patterns such as reaching overhead and into cabinets.  Baseline: see ROM chart above Goal status: INITIAL  3.  Pt will demonstrate at least 4+/5 shoulder flex/abd MMT for improved symmetry of UE strength and improved tolerance to functional movements.  Baseline: NT on eval Goal status: INITIAL  4. Pt will report at least 50% decrease in overall pain levels in past week in order to facilitate improved tolerance to basic ADLs/mobility.   Baseline: 0-9/10  Goal status: INITIAL     5. Pt will report ability to perform upper and lower body dressing with less than 3 pt increase in pain in order to facilitate improved tolerance to ADLs.  Baseline: inc pain with LBD/UBD  Goal status: INITIAL  6. Pt  will be able to move 5# from waist to shoulder height for 5 repetitions in order to indicate improved functional shoulder endurance for work tasks.  Baseline: avoiding lifting tasks  Goal status: INITIAL   PLAN:  PT FREQUENCY: 1-2x/week  PT DURATION: 8 weeks  PLANNED INTERVENTIONS: 97164- PT Re-evaluation, 97110-Therapeutic exercises, 97530- Therapeutic activity, 97112- Neuromuscular re-education, 97535- Self Care, 09604- Manual therapy, [id number]- Electrical stimulation (unattended), Patient/Family education, Balance training, Stair training, Taping, Dry Needling, Joint mobilization, Spinal mobilization, Scar mobilization, Cryotherapy, and Moist heat  PLAN FOR NEXT SESSION: Review/update HEP PRN. Work on [employer] exercises as appropriate with emphasis on shoulder PROM/AAROM initially, reducing muscle guarding. Periscapular activation and postural endurance within pt tolerance. Symptom modification strategies as indicated/appropriate.    Berta Minor PTA  06/21/2023 4:09 PM

## 2023-06-23 ENCOUNTER — Ambulatory Visit: Attending: Orthopedic Surgery

## 2023-06-23 DIAGNOSIS — M25511 Pain in right shoulder: Secondary | ICD-10-CM | POA: Insufficient documentation

## 2023-06-23 DIAGNOSIS — M6281 Muscle weakness (generalized): Secondary | ICD-10-CM | POA: Insufficient documentation

## 2023-06-23 NOTE — Therapy (Signed)
 OUTPATIENT PHYSICAL THERAPY TREATMENT NOTE   Patient Name: Mackenzie Clark MRN: [id number] DOB:[birth year], 58 y.o., female Today's Date: 06/23/2023  END OF SESSION:  PT End of Session - 06/23/23 1402     Visit Number 3    Number of Visits 17    Date for PT Re-Evaluation 08/10/23    Authorization Type workers comp    Authorization Time Period Approved 1 evaluation and 12 therapy visits    Authorization - Visit Number 2    Authorization - Number of Visits 12    PT Start Time 1402    PT Stop Time 1440    PT Time Calculation (min) 38 min    Activity Tolerance Patient tolerated treatment well    Behavior During Therapy WFL for tasks assessed/performed             Past Medical History:  Diagnosis Date   Allergy    Arthritis    r shoulder   Diabetes mellitus without complication (HCC)    diet and exercise controlled   Hypertension    Past Surgical History:  Procedure Laterality Date   DILATION AND CURETTAGE OF UTERUS  04/05/2004   16 week stillbirth   IR ANGIOGRAM PELVIS SELECTIVE OR SUPRASELECTIVE  08/31/2019   IR ANGIOGRAM SELECTIVE EACH ADDITIONAL VESSEL  08/31/2019   IR EMBO TUMOR ORGAN ISCHEMIA INFARCT INC GUIDE ROADMAPPING  08/31/2019   IR FLUORO GUIDED NEEDLE PLC ASPIRATION/INJECTION LOC  08/31/2019   IR RADIOLOGIST EVAL & MGMT  07/10/2019   IR RADIOLOGIST EVAL & MGMT  09/18/2019   IR US GUIDE VASC ACCESS RIGHT  08/31/2019   TONSILLECTOMY     and adnoids   Patient Active Problem List   Diagnosis Date Noted   History of hematuria 03/01/2023   Encounter for general adult medical examination w/o abnormal findings 11/01/2022   Aortic atherosclerosis (HCC) 11/01/2022   Anxiety 11/01/2022   Vitamin D deficiency disease 11/01/2022   Irregular menses 11/01/2022   Sprain of right ankle 11/01/2022   Pure hypercholesterolemia 05/06/2022   Overweight with body mass index (BMI) of 29 to 29.9 in adult 01/22/2021   Dyslipidemia associated with type 2 diabetes mellitus  (HCC) 01/22/2021   Fibroids 08/31/2019   Abnormal uterine bleeding due to intramural leiomyoma 08/31/2019   Abnormal uterine bleeding (AUB) 05/21/2019   Uterine leiomyoma 05/21/2019   Body mass index (BMI) of 30.0-30.9 in adult 09/03/2017   Type II diabetes mellitus, uncontrolled 09/03/2017   Obesity 09/03/2017   Hypertensive heart disease 03/05/2013   Surveillance of previously prescribed contraceptive pill 03/05/2013    PCP: Dorothyann Peng, MD  REFERRING PROVIDER: Jones Broom, MD  REFERRING DIAG: S/P RIGHT SHOULDER SAD , DCE , CUFF DEBRIDEMENT  THERAPY DIAG:  Right shoulder pain, unspecified chronicity  Muscle weakness (generalized)  Acute pain of right shoulder  Rationale for Evaluation and Treatment: Rehabilitation  ONSET DATE: R shoulder cuff debridement, SAD, DCE DOS 06/02/23  Next MD appt: Next month per pt report  SUBJECTIVE:  SUBJECTIVE STATEMENT: Patient reports 5/10 pain in her shoulder today, continued HEP compliance.   EVAL: Pt states she is having some shoulder soreness, states follow up with surgeon went well. Pt states she is out of work right now, denies formal restrictions. States she has limitations with ADLs as below, limited primarily by soreness. No N/T, no significant swelling, no redness, no fevers/chills, no drainage from portals.  Hand dominance: Left  PERTINENT HISTORY: HTN, DM2  PAIN:  Are you having pain: no resting pain, soreness with movement Location/description: R shoulder Best-worst over past week: 0-9/10  - aggravating factors: upper body dressing, tying shoes, washing back, doing hair, sleeping - Easing factors: brace, ice pack     PRECAUTIONS: post op R shoulder ; per paper referral from physician okay for PROM/AAROM/AROM, strengthening   RED  FLAGS: None   WEIGHT BEARING RESTRICTIONS: No  FALLS:  Has patient fallen in last 6 months? No  LIVING ENVIRONMENT: 2 story home, no issues with stairs, lives with son . 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  DIAGNOSTIC FINDINGS:  DOS 06/02/23 R shoulder SAD, DCE, cuff debridement  PATIENT SURVEYS:  Patient-specific activity scoring scheme (Point to one number):  "0" represents "unable to perform." "10" represents "able to perform at prior level. 0 1 2 3 4 5 6 7 8 9  10 (Date and Score) Activity Initial  Activity Eval     Fasten bra  5    Hair (wash)  0    Wash back 5   Tie shoes 5   Reach into cabinets 4    Total score = sum of the activity scores/number of activities Minimum detectable change (90%CI) for average score = 2 points Minimum detectable change (90%CI) for single activity score = 3 points Score: 19  COGNITION: Overall cognitive status: Within functional limits for tasks assessed     SENSATION: Denies sensory complaints  POSTURE: Mild guarding RUE  UPPER EXTREMITY ROM:  A/PROM Right eval Left eval  Shoulder flexion A: 109 deg  162  Shoulder abduction A: 128 deg   Shoulder internal rotation     Shoulder external rotation     Elbow flexion    Elbow extension    Wrist flexion    Wrist extension     (Blank rows = not tested) (Key: WFL = within functional limits not formally assessed, * = concordant pain, s = stiffness/stretching sensation, NT = not tested)  Comments: PROM testing limited by muscle guarding  UPPER EXTREMITY MMT:  MMT Right eval Left eval  Shoulder flexion    Shoulder extension    Shoulder abduction    Shoulder extension    Shoulder internal rotation     Shoulder external rotation    Elbow flexion    Elbow extension    Grip strength    (Blank rows = not tested)  (Key: WFL = within functional limits not formally assessed, * = concordant pain, s = stiffness/stretching sensation, NT = not tested)  Comments: deferred given proximity to surgery and pain w/ active mobility  PALPATION/OBSERVATION:  3 portal incisions all well appearing - no redness, swelling, drainage  TREATMENT DATE:  [facility] Adult PT Treatment:                                                DATE: 06/23/23 Therapeutic Exercise: Seated scapular retraction 2x10 Seated ER with dowel 2x10 Seated scaption with dowel 2x10 Supine shoulder flexion with dowel AAROM 2x10 Supine chest press with dowel AAROM 2x10 Supine shoulder flexion AROM Rt 2x10 Supine chest press AROM Rt 2x10 Supine isometrics ER/IR/Abd/flex/ext 5" hold x10 ea Finger ladder AAROM flexion/abduction x5 ea - cues to decrease compensation of shoulder elevation Wall slide in pillowcase flexion x10 Rt   [facility] Adult PT Treatment:                                                DATE: 06/21/23 Therapeutic Exercise: Seated scapular retraction 2x10 Seated ER with dowel 2x10 Supine shoulder flexion with dowel AAROM 2x10 Supine chest press with dowel AAROM 2x10 Supine shoulder flexion AROM Rt 2x10 Supine chest press AROM Rt 2x10 Supine isometrics ER/IR/Abd/flex/ext 5" hold x10 ea Finger ladder AAROM flexion/abduction x5 ea - cues to decrease compensation of shoulder elevation   [facility] Adult PT Treatment:                                                DATE: 06/15/23 Therapeutic Exercise: Shoulder flexion AAROM supine x8 short lever Shoulder chest press AAROM hands clasped x5 Scapular retraction x10 cradle position HEP handout + education   PATIENT EDUCATION: Education details: Pt education on PT  impairments, prognosis, and POC. Informed consent. Rationale for interventions, safe/appropriate HEP performance Person educated: Patient Education method: Explanation, Demonstration, Tactile cues, Verbal cues Education comprehension: verbalized understanding, returned demonstration, verbal cues required, tactile cues required, and needs further education    HOME EXERCISE PROGRAM: Access Code: [email or web address] URL: [email or web address] Date: 06/23/2023 Prepared by: Berta Minor  Exercises - Seated Scapular Retraction  - 2-3 x daily - 1 sets - 6-8 reps - Supine Shoulder Press  - 2-3 x daily - 1 sets - 6-8 reps - Supine Shoulder Flexion Extension Full Range AROM  - 1 x daily - 2-3 x weekly - 1 sets - 10 reps  ASSESSMENT:  CLINICAL IMPRESSION: Patient presents to PT reporting continued improvements in her shoulder pain. Updated HEP to include more AROM this session, patient demonstrating understanding of each exercise. Session today continued to focus on AAROM, AROM, and isometric strengthening. She has some pain with flexion/abduction, particularly with eccentric portions. Patient was able to tolerate all prescribed exercises with no adverse effects. Patient continues to benefit from skilled PT services and should be progressed as able to improve functional independence.   EVAL: Patient is a pleasant 58 y.o. woman who was seen today for physical therapy evaluation and treatment for R shoulder SAD, DCE and cuff debridement DOS 06/02/23, denies formal surgical restrictions. Endorses difficulty with ADLs and housework as expected post op, general soreness when using shoulder. On exam she demonstrates impaired GH strength/mobility as expected post op, PROM assessment limited primarily by muscle guarding. Requires cues/education for appropriate HEP performance, able to tolerate with mild  soreness but no significant increases in pain. No adverse events. Recommend skilled PT to address  aforementioned deficits with aim of improving tolerance to daily tasks and progress back to occupational tasks. Pt departs today's session in no acute distress, all voiced questions/concerns addressed appropriately from PT perspective.       OBJECTIVE IMPAIRMENTS: decreased activity tolerance, decreased endurance, decreased mobility, decreased ROM, decreased strength, impaired perceived functional ability, impaired UE functional use, and pain.   ACTIVITY LIMITATIONS: carrying, lifting, bathing, dressing, reach over head, and hygiene/grooming  PARTICIPATION LIMITATIONS: meal prep, cleaning, laundry, community activity, and occupation  PERSONAL FACTORS: Time since onset of injury/illness/exacerbation and 1-2 comorbidities: DM, HTN  are also affecting patient's functional outcome.   REHAB POTENTIAL: Good  CLINICAL DECISION MAKING: Stable/uncomplicated  EVALUATION COMPLEXITY: Low   GOALS:  SHORT TERM GOALS: Target date: 07/13/2023  Pt will demonstrate appropriate understanding and performance of initially prescribed HEP in order to facilitate improved independence with management of symptoms.  Baseline: HEP established  Goal status: INITIAL   2. Pt will report at least 25% improvement in overall pain levels over past week in order to facilitate improved tolerance to typical daily activities.   Baseline: 0-9/10  Goal status: INITIAL  LONG TERM GOALS: Target date: 08/10/2023 Pt will score greater than 28 on PSFS in order to facilitate improved perception of function.   Baseline: 19   Goal status: INITIAL  2.  Pt will demonstrate at least 150 degrees of active R shoulder elevation in order to demonstrate improved tolerance to functional movement patterns such as reaching overhead and into cabinets.  Baseline: see ROM chart above Goal status: INITIAL  3.  Pt will demonstrate at least 4+/5 shoulder flex/abd MMT for improved symmetry of UE strength and improved tolerance to functional  movements.  Baseline: NT on eval Goal status: INITIAL  4. Pt will report at least 50% decrease in overall pain levels in past week in order to facilitate improved tolerance to basic ADLs/mobility.   Baseline: 0-9/10  Goal status: INITIAL     5. Pt will report ability to perform upper and lower body dressing with less than 3 pt increase in pain in order to facilitate improved tolerance to ADLs.  Baseline: inc pain with LBD/UBD  Goal status: INITIAL  6. Pt will be able to move 5# from waist to shoulder height for 5 repetitions in order to indicate improved functional shoulder endurance for work tasks.  Baseline: avoiding lifting tasks  Goal status: INITIAL   PLAN:  PT FREQUENCY: 1-2x/week  PT DURATION: 8 weeks  PLANNED INTERVENTIONS: 97164- PT Re-evaluation, 97110-Therapeutic exercises, 97530- Therapeutic activity, 97112- Neuromuscular re-education, 97535- Self Care, [id number]- Manual therapy, [id number]- Electrical stimulation (unattended), Patient/Family education, Balance training, Stair training, Taping, Dry Needling, Joint mobilization, Spinal mobilization, Scar mobilization, Cryotherapy, and Moist heat  PLAN FOR NEXT SESSION: Review/update HEP PRN. Work on [employer] exercises as appropriate with emphasis on shoulder PROM/AAROM initially, reducing muscle guarding. Periscapular activation and postural endurance within pt tolerance. Symptom modification strategies as indicated/appropriate.    Berta Minor PTA  06/23/2023 2:42 PM

## 2023-06-28 ENCOUNTER — Ambulatory Visit: Payer: Self-pay | Attending: Orthopedic Surgery

## 2023-06-28 DIAGNOSIS — M25511 Pain in right shoulder: Secondary | ICD-10-CM

## 2023-06-28 DIAGNOSIS — M6281 Muscle weakness (generalized): Secondary | ICD-10-CM | POA: Diagnosis present

## 2023-06-28 NOTE — Therapy (Signed)
 OUTPATIENT PHYSICAL THERAPY TREATMENT NOTE   Patient Name: Mackenzie Clark MRN: [id number] DOB:09-Oct-1965, 58 y.o., female Today's Date: 06/28/2023  END OF SESSION:  PT End of Session - 06/28/23 1405     Visit Number 4    Number of Visits 17    Date for PT Re-Evaluation 08/10/23    Authorization Type workers comp    Authorization Time Period Approved 1 evaluation and 12 therapy visits    Authorization - Visit Number 3    Authorization - Number of Visits 12    PT Start Time 1404    PT Stop Time 1442    PT Time Calculation (min) 38 min    Activity Tolerance Patient tolerated treatment well    Behavior During Therapy WFL for tasks assessed/performed             Past Medical History:  Diagnosis Date   Allergy    Arthritis    r shoulder   Diabetes mellitus without complication (HCC)    diet and exercise controlled   Hypertension    Past Surgical History:  Procedure Laterality Date   DILATION AND CURETTAGE OF UTERUS  04/05/2004   16 week stillbirth   IR ANGIOGRAM PELVIS SELECTIVE OR SUPRASELECTIVE  08/31/2019   IR ANGIOGRAM SELECTIVE EACH ADDITIONAL VESSEL  08/31/2019   IR EMBO TUMOR ORGAN ISCHEMIA INFARCT INC GUIDE ROADMAPPING  08/31/2019   IR FLUORO GUIDED NEEDLE PLC ASPIRATION/INJECTION LOC  08/31/2019   IR RADIOLOGIST EVAL & MGMT  07/10/2019   IR RADIOLOGIST EVAL & MGMT  09/18/2019   IR US GUIDE VASC ACCESS RIGHT  08/31/2019   TONSILLECTOMY     and adnoids   Patient Active Problem List   Diagnosis Date Noted   History of hematuria 03/01/2023   Encounter for general adult medical examination w/o abnormal findings 11/01/2022   Aortic atherosclerosis (HCC) 11/01/2022   Anxiety 11/01/2022   Vitamin D deficiency disease 11/01/2022   Irregular menses 11/01/2022   Sprain of right ankle 11/01/2022   Pure hypercholesterolemia 05/06/2022   Overweight with body mass index (BMI) of 29 to 29.9 in adult 01/22/2021   Dyslipidemia associated with type 2 diabetes mellitus  (HCC) 01/22/2021   Fibroids 08/31/2019   Abnormal uterine bleeding due to intramural leiomyoma 08/31/2019   Abnormal uterine bleeding (AUB) 05/21/2019   Uterine leiomyoma 05/21/2019   Body mass index (BMI) of 30.0-30.9 in adult 09/03/2017   Type II diabetes mellitus, uncontrolled 09/03/2017   Obesity 09/03/2017   Hypertensive heart disease 03/05/2013   Surveillance of previously prescribed contraceptive pill 03/05/2013    PCP: Dorothyann Peng, MD  REFERRING PROVIDER: Jones Broom, MD  REFERRING DIAG: S/P RIGHT SHOULDER SAD , DCE , CUFF DEBRIDEMENT  THERAPY DIAG:  Right shoulder pain, unspecified chronicity  Muscle weakness (generalized)  Acute pain of right shoulder  Rationale for Evaluation and Treatment: Rehabilitation  ONSET DATE: R shoulder cuff debridement, SAD, DCE DOS 06/02/23  Next MD appt: Next month per pt report  SUBJECTIVE:  SUBJECTIVE STATEMENT: Patient reports 5/10 pain today, states it was keeping her awake last night and she had a hard time sleeping.  EVAL: Pt states she is having some shoulder soreness, states follow up with surgeon went well. Pt states she is out of work right now, denies formal restrictions. States she has limitations with ADLs as below, limited primarily by soreness. No N/T, no significant swelling, no redness, no fevers/chills, no drainage from portals.  Hand dominance: Left  PERTINENT HISTORY: HTN, DM2  PAIN:  Are you having pain: no resting pain, soreness with movement Location/description: R shoulder Best-worst over past week: 0-9/10  - aggravating factors: upper body dressing, tying shoes, washing back, doing hair, sleeping - Easing factors: brace, ice pack     PRECAUTIONS: post op R shoulder ; per paper referral from physician okay for  PROM/AAROM/AROM, strengthening   RED FLAGS: None   WEIGHT BEARING RESTRICTIONS: No  FALLS:  Has patient fallen in last 6 months? No  LIVING ENVIRONMENT: 2 story home, no issues with stairs, lives with son . 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  DIAGNOSTIC FINDINGS:  DOS 06/02/23 R shoulder SAD, DCE, cuff debridement  PATIENT SURVEYS:  Patient-specific activity scoring scheme (Point to one number):  "0" represents "unable to perform." "10" represents "able to perform at prior level. 0 1 2 3 4 5 6 7 8 9  10 (Date and Score) Activity Initial  Activity Eval     Fasten bra  5    Hair (wash)  0    Wash back 5   Tie shoes 5   Reach into cabinets 4    Total score = sum of the activity scores/number of activities Minimum detectable change (90%CI) for average score = 2 points Minimum detectable change (90%CI) for single activity score = 3 points Score: 19  COGNITION: Overall cognitive status: Within functional limits for tasks assessed     SENSATION: Denies sensory complaints  POSTURE: Mild guarding RUE  UPPER EXTREMITY ROM:  A/PROM Right eval Left eval Right 06/28/23  Shoulder flexion A: 109 deg  162 A: 120  Shoulder abduction A: 128 deg    Shoulder internal rotation      Shoulder external rotation      Elbow flexion     Elbow extension     Wrist flexion     Wrist extension      (Blank rows = not tested) (Key: WFL = within functional limits not formally assessed, * = concordant pain, s = stiffness/stretching sensation, NT = not tested)  Comments: PROM testing limited by muscle guarding  UPPER EXTREMITY MMT:  MMT Right eval Left eval  Shoulder flexion    Shoulder extension     Shoulder abduction    Shoulder extension    Shoulder internal rotation    Shoulder external rotation    Elbow flexion    Elbow extension    Grip strength    (Blank rows = not tested)  (Key: WFL = within functional limits not formally assessed, * = concordant pain, s = stiffness/stretching sensation, NT = not tested)  Comments: deferred given proximity to surgery and pain w/ active mobility  PALPATION/OBSERVATION:  3 portal incisions all well appearing - no redness, swelling,  drainage                                                                                                                             TREATMENT DATE:  [facility] Adult PT Treatment:                                                DATE: 06/28/23 Therapeutic Exercise: Seated scapular retraction 3x10 Seated ER with dowel 2x10 Seated scaption with dowel 2x10 Reclined shoulder flexion with dowel AAROM 2x10 Reclined chest press with dowel AAROM 2x10 Reclined shoulder flexion AROM Rt 2x10 Reclined chest press AROM Rt 2x10 Reclined shoulder abduction AROM Rt x10 Isometrics into wall 5" hold ER/IR/Abd/Flex/Ext x10 ea Finger ladder AAROM flexion/abduction x5 ea - cues to decrease compensation of shoulder elevation Wall slide with towel flexion x10 Rt with gentle stretch at end range Rows YTB x10 Shoulder extension YTB (too painful)   [facility] Adult PT Treatment:                                                DATE: 06/23/23 Therapeutic Exercise: Seated scapular retraction 2x10 Seated ER with dowel 2x10 Seated scaption with dowel 2x10 Supine shoulder flexion with dowel AAROM 2x10 Supine chest press with dowel AAROM 2x10 Supine shoulder flexion AROM Rt 2x10 Supine chest press AROM Rt 2x10 Supine isometrics ER/IR/Abd/flex/ext 5" hold x10 ea Finger ladder AAROM flexion/abduction x5 ea - cues to decrease compensation of shoulder elevation Wall slide in pillowcase flexion x10 Rt   [facility] Adult PT Treatment:                                                 DATE: 06/21/23 Therapeutic Exercise: Seated scapular retraction 2x10 Seated ER with dowel 2x10 Supine shoulder flexion with dowel AAROM 2x10 Supine chest press with dowel AAROM 2x10 Supine shoulder flexion AROM Rt 2x10 Supine chest press AROM Rt 2x10 Supine isometrics ER/IR/Abd/flex/ext 5" hold x10 ea Finger ladder AAROM flexion/abduction x5 ea - cues to decrease compensation of shoulder elevation   PATIENT EDUCATION: Education details: Pt education on PT impairments, prognosis, and POC. Informed consent. Rationale for interventions, safe/appropriate HEP performance Person educated: Patient Education method: Explanation, Demonstration, Tactile cues, Verbal cues Education comprehension: verbalized understanding, returned demonstration, verbal cues required, tactile cues required, and needs further education    HOME EXERCISE PROGRAM: Access Code: [email or web address] URL: [email or web address] Date: 06/23/2023 Prepared by: Berta Minor  Exercises - Seated Scapular Retraction  - 2-3 x daily - 1 sets - 6-8 reps - Supine Shoulder Press  -  2-3 x daily - 1 sets - 6-8 reps - Supine Shoulder Flexion Extension Full Range AROM  - 1 x daily - 2-3 x weekly - 1 sets - 10 reps  ASSESSMENT:  CLINICAL IMPRESSION: Patient presents to PT reporting she had increased shoulder pain overnight and that it is still sore today. Has been compliant with updated HEP. Session today focused on AAROM and AROM with motions performed in reclined position rather than supine for increased challenge. She continues to have pain with eccentric control of flexion/abduction. Her AROM continues to improve, see above chart. Patient was able to tolerate all prescribed exercises with no adverse effects. Patient continues to benefit from skilled PT services and should be progressed as able to improve functional independence.    EVAL: Patient is a pleasant 58 y.o. woman who was seen today for  physical therapy evaluation and treatment for R shoulder SAD, DCE and cuff debridement DOS 06/02/23, denies formal surgical restrictions. Endorses difficulty with ADLs and housework as expected post op, general soreness when using shoulder. On exam she demonstrates impaired GH strength/mobility as expected post op, PROM assessment limited primarily by muscle guarding. Requires cues/education for appropriate HEP performance, able to tolerate with mild soreness but no significant increases in pain. No adverse events. Recommend skilled PT to address aforementioned deficits with aim of improving tolerance to daily tasks and progress back to occupational tasks. Pt departs today's session in no acute distress, all voiced questions/concerns addressed appropriately from PT perspective.       OBJECTIVE IMPAIRMENTS: decreased activity tolerance, decreased endurance, decreased mobility, decreased ROM, decreased strength, impaired perceived functional ability, impaired UE functional use, and pain.   ACTIVITY LIMITATIONS: carrying, lifting, bathing, dressing, reach over head, and hygiene/grooming  PARTICIPATION LIMITATIONS: meal prep, cleaning, laundry, community activity, and occupation  PERSONAL FACTORS: Time since onset of injury/illness/exacerbation and 1-2 comorbidities: DM, HTN  are also affecting patient's functional outcome.   REHAB POTENTIAL: Good  CLINICAL DECISION MAKING: Stable/uncomplicated  EVALUATION COMPLEXITY: Low   GOALS:  SHORT TERM GOALS: Target date: 07/13/2023  Pt will demonstrate appropriate understanding and performance of initially prescribed HEP in order to facilitate improved independence with management of symptoms.  Baseline: HEP established  Goal status: INITIAL   2. Pt will report at least 25% improvement in overall pain levels over past week in order to facilitate improved tolerance to typical daily activities.   Baseline: 0-9/10  Goal status: INITIAL  LONG TERM GOALS:  Target date: 08/10/2023 Pt will score greater than 28 on PSFS in order to facilitate improved perception of function.   Baseline: 19   Goal status: INITIAL  2.  Pt will demonstrate at least 150 degrees of active R shoulder elevation in order to demonstrate improved tolerance to functional movement patterns such as reaching overhead and into cabinets.  Baseline: see ROM chart above Goal status: INITIAL  3.  Pt will demonstrate at least 4+/5 shoulder flex/abd MMT for improved symmetry of UE strength and improved tolerance to functional movements.  Baseline: NT on eval Goal status: INITIAL  4. Pt will report at least 50% decrease in overall pain levels in past week in order to facilitate improved tolerance to basic ADLs/mobility.   Baseline: 0-9/10  Goal status: INITIAL     5. Pt will report ability to perform upper and lower body dressing with less than 3 pt increase in pain in order to facilitate improved tolerance to ADLs.  Baseline: inc pain with LBD/UBD  Goal status: INITIAL  6.  Pt will be able to move 5# from waist to shoulder height for 5 repetitions in order to indicate improved functional shoulder endurance for work tasks.  Baseline: avoiding lifting tasks  Goal status: INITIAL   PLAN:  PT FREQUENCY: 1-2x/week  PT DURATION: 8 weeks  PLANNED INTERVENTIONS: 97164- PT Re-evaluation, 97110-Therapeutic exercises, 97530- Therapeutic activity, 97112- Neuromuscular re-education, 97535- Self Care, 10272- Manual therapy, [id number]- Electrical stimulation (unattended), Patient/Family education, Balance training, Stair training, Taping, Dry Needling, Joint mobilization, Spinal mobilization, Scar mobilization, Cryotherapy, and Moist heat  PLAN FOR NEXT SESSION: Review/update HEP PRN. Work on [employer] exercises as appropriate with emphasis on shoulder PROM/AAROM initially, reducing muscle guarding. Periscapular activation and postural endurance within pt tolerance. Symptom modification  strategies as indicated/appropriate.    Berta Minor PTA  06/28/2023 2:41 PM

## 2023-06-30 ENCOUNTER — Ambulatory Visit

## 2023-06-30 DIAGNOSIS — M25511 Pain in right shoulder: Secondary | ICD-10-CM

## 2023-06-30 DIAGNOSIS — M6281 Muscle weakness (generalized): Secondary | ICD-10-CM

## 2023-06-30 NOTE — Therapy (Signed)
 OUTPATIENT PHYSICAL THERAPY TREATMENT NOTE   Patient Name: Mackenzie Clark MRN: [id number] DOB:1965-05-23, 58 y.o., female Today's Date: 06/30/2023  END OF SESSION:  PT End of Session - 06/30/23 1359     Visit Number 5    Number of Visits 17    Date for PT Re-Evaluation 08/10/23    Authorization Type workers comp    Authorization Time Period Approved 1 evaluation and 12 therapy visits    Authorization - Visit Number 4    Authorization - Number of Visits 12    PT Start Time 1400    PT Stop Time 1438    PT Time Calculation (min) 38 min    Activity Tolerance Patient tolerated treatment well    Behavior During Therapy WFL for tasks assessed/performed            Past Medical History:  Diagnosis Date   Allergy    Arthritis    r shoulder   Diabetes mellitus without complication (HCC)    diet and exercise controlled   Hypertension    Past Surgical History:  Procedure Laterality Date   DILATION AND CURETTAGE OF UTERUS  04/05/2004   16 week stillbirth   IR ANGIOGRAM PELVIS SELECTIVE OR SUPRASELECTIVE  08/31/2019   IR ANGIOGRAM SELECTIVE EACH ADDITIONAL VESSEL  08/31/2019   IR EMBO TUMOR ORGAN ISCHEMIA INFARCT INC GUIDE ROADMAPPING  08/31/2019   IR FLUORO GUIDED NEEDLE PLC ASPIRATION/INJECTION LOC  08/31/2019   IR RADIOLOGIST EVAL & MGMT  07/10/2019   IR RADIOLOGIST EVAL & MGMT  09/18/2019   IR US GUIDE VASC ACCESS RIGHT  08/31/2019   TONSILLECTOMY     and adnoids   Patient Active Problem List   Diagnosis Date Noted   History of hematuria 03/01/2023   Encounter for general adult medical examination w/o abnormal findings 11/01/2022   Aortic atherosclerosis (HCC) 11/01/2022   Anxiety 11/01/2022   Vitamin D deficiency disease 11/01/2022   Irregular menses 11/01/2022   Sprain of right ankle 11/01/2022   Pure hypercholesterolemia 05/06/2022   Overweight with body mass index (BMI) of 29 to 29.9 in adult 01/22/2021   Dyslipidemia associated with type 2 diabetes mellitus  (HCC) 01/22/2021   Fibroids 08/31/2019   Abnormal uterine bleeding due to intramural leiomyoma 08/31/2019   Abnormal uterine bleeding (AUB) 05/21/2019   Uterine leiomyoma 05/21/2019   Body mass index (BMI) of 30.0-30.9 in adult 09/03/2017   Type II diabetes mellitus, uncontrolled 09/03/2017   Obesity 09/03/2017   Hypertensive heart disease 03/05/2013   Surveillance of previously prescribed contraceptive pill 03/05/2013    PCP: Dorothyann Peng, MD  REFERRING PROVIDER: Jones Broom, MD  REFERRING DIAG: S/P RIGHT SHOULDER SAD , DCE , CUFF DEBRIDEMENT  THERAPY DIAG:  Right shoulder pain, unspecified chronicity  Muscle weakness (generalized)  Acute pain of right shoulder  Rationale for Evaluation and Treatment: Rehabilitation  ONSET DATE: R shoulder cuff debridement, SAD, DCE DOS 06/02/23  Next MD appt: Next month per pt report  SUBJECTIVE:  SUBJECTIVE STATEMENT: Patient reports that her pain has continues to be higher this week, especially at night. She has been taking Tylenol and icing which is helping the pain.   EVAL: Pt states she is having some shoulder soreness, states follow up with surgeon went well. Pt states she is out of work right now, denies formal restrictions. States she has limitations with ADLs as below, limited primarily by soreness. No N/T, no significant swelling, no redness, no fevers/chills, no drainage from portals.  Hand dominance: Left  PERTINENT HISTORY: HTN, DM2  PAIN:  Are you having pain: no resting pain, soreness with movement Location/description: R shoulder Best-worst over past week: 0-9/10  - aggravating factors: upper body dressing, tying shoes, washing back, doing hair, sleeping - Easing factors: brace, ice pack     PRECAUTIONS: post op R shoulder ; per  paper referral from physician okay for PROM/AAROM/AROM, strengthening   RED FLAGS: None   WEIGHT BEARING RESTRICTIONS: No  FALLS:  Has patient fallen in last 6 months? No  LIVING ENVIRONMENT: 2 story home, no issues with stairs, lives with son . 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  DIAGNOSTIC FINDINGS:  DOS 06/02/23 R shoulder SAD, DCE, cuff debridement  PATIENT SURVEYS:  Patient-specific activity scoring scheme (Point to one number):  "0" represents "unable to perform." "10" represents "able to perform at prior level. 0 1 2 3 4 5 6 7 8 9  10 (Date and Score) Activity Initial  Activity Eval     Fasten bra  5    Hair (wash)  0    Wash back 5   Tie shoes 5   Reach into cabinets 4    Total score = sum of the activity scores/number of activities Minimum detectable change (90%CI) for average score = 2 points Minimum detectable change (90%CI) for single activity score = 3 points Score: 19  COGNITION: Overall cognitive status: Within functional limits for tasks assessed     SENSATION: Denies sensory complaints  POSTURE: Mild guarding RUE  UPPER EXTREMITY ROM:  A/PROM Right eval Left eval Right 06/28/23  Shoulder flexion A: 109 deg  162 A: 120  Shoulder abduction A: 128 deg    Shoulder internal rotation      Shoulder external rotation      Elbow flexion     Elbow extension     Wrist flexion     Wrist extension      (Blank rows = not tested) (Key: WFL = within functional limits not formally assessed, * = concordant pain, s = stiffness/stretching sensation, NT = not tested)  Comments: PROM testing limited by muscle guarding  UPPER EXTREMITY MMT:  MMT Right eval Left eval  Shoulder  flexion    Shoulder extension    Shoulder abduction    Shoulder extension    Shoulder internal rotation    Shoulder external rotation    Elbow flexion    Elbow extension    Grip strength    (Blank rows = not tested)  (Key: WFL = within functional limits not formally assessed, * = concordant pain, s = stiffness/stretching sensation, NT = not tested)  Comments: deferred given proximity to surgery and pain w/ active mobility  PALPATION/OBSERVATION:  3 portal  incisions all well appearing - no redness, swelling, drainage                                                                                                                             TREATMENT DATE:  [facility] Adult PT Treatment:                                                DATE: 06/30/23 Therapeutic Exercise: Seated scapular retraction 3x10 Seated ER with dowel 2x10 Seated scaption with dowel 2x10 Reclined shoulder flexion with dowel AAROM 2x10 Reclined chest press with dowel AAROM 2x10 Reclined shoulder flexion AROM Rt 2x10 Reclined chest press AROM Rt 2x10 Reclined shoulder abduction AROM Rt 2x10 Isometrics into wall 5" hold ER/IR/Abd/Flex/Ext x10 ea Wall slide with towel flexion x10 Rt with gentle stretch at end range Rows YTB x10 Shoulder extension YTB x10 (small range)   [facility] Adult PT Treatment:                                                DATE: 06/28/23 Therapeutic Exercise: Seated scapular retraction 3x10 Seated ER with dowel 2x10 Seated scaption with dowel 2x10 Reclined shoulder flexion with dowel AAROM 2x10 Reclined chest press with dowel AAROM 2x10 Reclined shoulder flexion AROM Rt 2x10 Reclined chest press AROM Rt 2x10 Reclined shoulder abduction AROM Rt x10 Isometrics into wall 5" hold ER/IR/Abd/Flex/Ext x10 ea Finger ladder AAROM flexion/abduction x5 ea - cues to decrease compensation of shoulder elevation Wall slide with towel flexion x10 Rt with gentle stretch at end range Rows YTB x10 Shoulder  extension YTB (too painful)   [facility] Adult PT Treatment:                                                DATE: 06/23/23 Therapeutic Exercise: Seated scapular retraction 2x10 Seated ER with dowel 2x10 Seated scaption with dowel 2x10 Supine shoulder flexion with dowel AAROM 2x10 Supine chest press with dowel AAROM 2x10 Supine shoulder flexion AROM Rt 2x10 Supine chest press AROM Rt 2x10 Supine isometrics ER/IR/Abd/flex/ext 5" hold x10 ea Finger ladder AAROM flexion/abduction x5 ea - cues to decrease compensation of shoulder elevation Wall slide in pillowcase flexion x10 Rt   PATIENT EDUCATION: Education details: Pt education on PT impairments, prognosis, and POC. Informed consent. Rationale for interventions, safe/appropriate HEP performance Person educated: Patient Education method: Explanation, Demonstration, Tactile cues, Verbal cues Education comprehension: verbalized understanding, returned demonstration, verbal cues required, tactile cues required, and needs further education    HOME EXERCISE PROGRAM: Access Code: [email or web address] URL: [email or web address] Date:  06/23/2023 Prepared by: Berta Minor  Exercises - Seated Scapular Retraction  - 2-3 x daily - 1 sets - 6-8 reps - Supine Shoulder Press  - 2-3 x daily - 1 sets - 6-8 reps - Supine Shoulder Flexion Extension Full Range AROM  - 1 x daily - 2-3 x weekly - 1 sets - 10 reps  ASSESSMENT:  CLINICAL IMPRESSION: Patient presents to PT reporting continued higher levels of pain this week, especially at night, that has been manageable with OTC medication and icing. Continued to focus session on Rt shoulder AAROM and AROM in reclined position as well as isometrics and gentle periscapular strengthening. She has some pain with flexion and abduction AROM at the transition of up to down, but is manageable and she continues to show improved ROM. Patient was able to tolerate all prescribed exercises with no adverse effects.  Patient continues to benefit from skilled PT services and should be progressed as able to improve functional independence.   EVAL: Patient is a pleasant 58 y.o. woman who was seen today for physical therapy evaluation and treatment for R shoulder SAD, DCE and cuff debridement DOS 06/02/23, denies formal surgical restrictions. Endorses difficulty with ADLs and housework as expected post op, general soreness when using shoulder. On exam she demonstrates impaired GH strength/mobility as expected post op, PROM assessment limited primarily by muscle guarding. Requires cues/education for appropriate HEP performance, able to tolerate with mild soreness but no significant increases in pain. No adverse events. Recommend skilled PT to address aforementioned deficits with aim of improving tolerance to daily tasks and progress back to occupational tasks. Pt departs today's session in no acute distress, all voiced questions/concerns addressed appropriately from PT perspective.       OBJECTIVE IMPAIRMENTS: decreased activity tolerance, decreased endurance, decreased mobility, decreased ROM, decreased strength, impaired perceived functional ability, impaired UE functional use, and pain.   ACTIVITY LIMITATIONS: carrying, lifting, bathing, dressing, reach over head, and hygiene/grooming  PARTICIPATION LIMITATIONS: meal prep, cleaning, laundry, community activity, and occupation  PERSONAL FACTORS: Time since onset of injury/illness/exacerbation and 1-2 comorbidities: DM, HTN  are also affecting patient's functional outcome.   REHAB POTENTIAL: Good  CLINICAL DECISION MAKING: Stable/uncomplicated  EVALUATION COMPLEXITY: Low   GOALS:  SHORT TERM GOALS: Target date: 07/13/2023  Pt will demonstrate appropriate understanding and performance of initially prescribed HEP in order to facilitate improved independence with management of symptoms.  Baseline: HEP established  Goal status: INITIAL   2. Pt will report at  least 25% improvement in overall pain levels over past week in order to facilitate improved tolerance to typical daily activities.   Baseline: 0-9/10  Goal status: INITIAL  LONG TERM GOALS: Target date: 08/10/2023 Pt will score greater than 28 on PSFS in order to facilitate improved perception of function.   Baseline: 19   Goal status: INITIAL  2.  Pt will demonstrate at least 150 degrees of active R shoulder elevation in order to demonstrate improved tolerance to functional movement patterns such as reaching overhead and into cabinets.  Baseline: see ROM chart above Goal status: INITIAL  3.  Pt will demonstrate at least 4+/5 shoulder flex/abd MMT for improved symmetry of UE strength and improved tolerance to functional movements.  Baseline: NT on eval Goal status: INITIAL  4. Pt will report at least 50% decrease in overall pain levels in past week in order to facilitate improved tolerance to basic ADLs/mobility.   Baseline: 0-9/10  Goal status: INITIAL     5. Pt will  report ability to perform upper and lower body dressing with less than 3 pt increase in pain in order to facilitate improved tolerance to ADLs.  Baseline: inc pain with LBD/UBD  Goal status: INITIAL  6. Pt will be able to move 5# from waist to shoulder height for 5 repetitions in order to indicate improved functional shoulder endurance for work tasks.  Baseline: avoiding lifting tasks  Goal status: INITIAL   PLAN:  PT FREQUENCY: 1-2x/week  PT DURATION: 8 weeks  PLANNED INTERVENTIONS: 97164- PT Re-evaluation, 97110-Therapeutic exercises, 97530- Therapeutic activity, 97112- Neuromuscular re-education, 97535- Self Care, 16109- Manual therapy, [id number]- Electrical stimulation (unattended), Patient/Family education, Balance training, Stair training, Taping, Dry Needling, Joint mobilization, Spinal mobilization, Scar mobilization, Cryotherapy, and Moist heat  PLAN FOR NEXT SESSION: Review/update HEP PRN. Work on [employer]  exercises as appropriate with emphasis on shoulder PROM/AAROM initially, reducing muscle guarding. Periscapular activation and postural endurance within pt tolerance. Symptom modification strategies as indicated/appropriate.    Berta Minor PTA  06/30/2023 2:37 PM

## 2023-07-01 NOTE — Therapy (Signed)
 OUTPATIENT PHYSICAL THERAPY TREATMENT NOTE   Patient Name: Mackenzie Clark MRN: [id number] DOB:[birth year], 58 y.o., female Today's Date: 07/04/2023  END OF SESSION:  PT End of Session - 07/04/23 1406     Visit Number 6    Number of Visits 17    Date for PT Re-Evaluation 08/10/23    Authorization Type workers comp    Authorization Time Period Approved 1 evaluation and 12 therapy visits    Authorization - Visit Number 5    Authorization - Number of Visits 12    PT Start Time 1406   late check in   PT Stop Time 1446    PT Time Calculation (min) 40 min    Activity Tolerance Patient tolerated treatment well             Past Medical History:  Diagnosis Date   Allergy    Arthritis    r shoulder   Diabetes mellitus without complication (HCC)    diet and exercise controlled   Hypertension    Past Surgical History:  Procedure Laterality Date   DILATION AND CURETTAGE OF UTERUS  04/05/2004   16 week stillbirth   IR ANGIOGRAM PELVIS SELECTIVE OR SUPRASELECTIVE  08/31/2019   IR ANGIOGRAM SELECTIVE EACH ADDITIONAL VESSEL  08/31/2019   IR EMBO TUMOR ORGAN ISCHEMIA INFARCT INC GUIDE ROADMAPPING  08/31/2019   IR FLUORO GUIDED NEEDLE PLC ASPIRATION/INJECTION LOC  08/31/2019   IR RADIOLOGIST EVAL & MGMT  07/10/2019   IR RADIOLOGIST EVAL & MGMT  09/18/2019   IR US GUIDE VASC ACCESS RIGHT  08/31/2019   TONSILLECTOMY     and adnoids   Patient Active Problem List   Diagnosis Date Noted   History of hematuria 03/01/2023   Encounter for general adult medical examination w/o abnormal findings 11/01/2022   Aortic atherosclerosis (HCC) 11/01/2022   Anxiety 11/01/2022   Vitamin D deficiency disease 11/01/2022   Irregular menses 11/01/2022   Sprain of right ankle 11/01/2022   Pure hypercholesterolemia 05/06/2022   Overweight with body mass index (BMI) of 29 to 29.9 in adult 01/22/2021   Dyslipidemia associated with type 2 diabetes mellitus (HCC) 01/22/2021   Fibroids 08/31/2019    Abnormal uterine bleeding due to intramural leiomyoma 08/31/2019   Abnormal uterine bleeding (AUB) 05/21/2019   Uterine leiomyoma 05/21/2019   Body mass index (BMI) of 30.0-30.9 in adult 09/03/2017   Type II diabetes mellitus, uncontrolled 09/03/2017   Obesity 09/03/2017   Hypertensive heart disease 03/05/2013   Surveillance of previously prescribed contraceptive pill 03/05/2013    PCP: Dorothyann Peng, MD  REFERRING PROVIDER: Jones Broom, MD  REFERRING DIAG: S/P RIGHT SHOULDER SAD , DCE , CUFF DEBRIDEMENT  THERAPY DIAG:  Right shoulder pain, unspecified chronicity  Muscle weakness (generalized)  Rationale for Evaluation and Treatment: Rehabilitation  ONSET DATE: R shoulder cuff debridement, SAD, DCE DOS 06/02/23  Next MD appt: Next month per pt report  SUBJECTIVE:  SUBJECTIVE STATEMENT: 07/04/2023 states pain continues to bother her, about 4/10 top of shoulder. Seems to be improving modestly, most of her pain is at night. Also bothers her with movement but tends to resolve with cessation of movement. States she'll get a little bit of soreness the morning after PT sessions. No other new updates, HEP going well.    EVAL: Pt states she is having some shoulder soreness, states follow up with surgeon went well. Pt states she is out of work right now, denies formal restrictions. States she has limitations with ADLs as below, limited primarily by soreness. No N/T, no significant swelling, no redness, no fevers/chills, no drainage from portals.  Hand dominance: Left  PERTINENT HISTORY: HTN, DM2  PAIN:  Are you having pain: 4/10 top of R shoulder   Per eval -  Location/description: R shoulder Best-worst over past week: 0-9/10  - aggravating factors: upper body dressing, tying shoes, washing back,  doing hair, sleeping - Easing factors: brace, ice pack     PRECAUTIONS: post op R shoulder ; per paper referral from physician okay for PROM/AAROM/AROM, strengthening   RED FLAGS: None   WEIGHT BEARING RESTRICTIONS: No  FALLS:  Has patient fallen in last 6 months? No  LIVING ENVIRONMENT: 2 story home, no issues with stairs, lives with son . 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  DIAGNOSTIC FINDINGS:  DOS 06/02/23 R shoulder SAD, DCE, cuff debridement  PATIENT SURVEYS:  Patient-specific activity scoring scheme (Point to one number):  "0" represents "unable to perform." "10" represents "able to perform at prior level. 0 1 2 3 4 5 6 7 8 9  10 (Date and Score) Activity Initial  Activity Eval     Fasten bra  5    Hair (wash)  0    Wash back 5   Tie shoes 5   Reach into cabinets 4    Total score = sum of the activity scores/number of activities Minimum detectable change (90%CI) for average score = 2 points Minimum detectable change (90%CI) for single activity score = 3 points Score: 19  COGNITION: Overall cognitive status: Within functional limits for tasks assessed     SENSATION: Denies sensory complaints  POSTURE: Mild guarding RUE  UPPER EXTREMITY ROM:  A/PROM Right eval Left eval Right 06/28/23  Shoulder flexion A: 109 deg  162 A: 120  Shoulder abduction A: 128 deg    Shoulder internal rotation      Shoulder external rotation      Elbow flexion     Elbow extension     Wrist flexion     Wrist extension      (Blank rows = not tested) (Key: WFL = within functional limits not formally assessed, * = concordant pain, s = stiffness/stretching sensation, NT = not tested)  Comments: PROM  testing limited by muscle guarding  UPPER EXTREMITY MMT:  MMT Right eval Left eval  Shoulder flexion    Shoulder extension    Shoulder abduction    Shoulder extension    Shoulder internal rotation    Shoulder external rotation    Elbow flexion    Elbow extension    Grip strength    (Blank rows = not tested)  (Key: WFL =  within functional limits not formally assessed, * = concordant pain, s = stiffness/stretching sensation, NT = not tested)  Comments: deferred given proximity to surgery and pain w/ active mobility  PALPATION/OBSERVATION:  3 portal incisions all well appearing - no redness, swelling, drainage                                                                                                                             TREATMENT DATE:  [facility] Adult PT Treatment:                                                DATE: 07/04/23 Therapeutic Exercise: Seated scap retraction x15 Yellow band row 2x10 cues for comfortable ROM/setup Reclined shoulder flex AAROM hands clasped 2x12 cues for pacing Reclined serratus punch 2x8, 1#x8 Sidelying ER AROM to neutral 2x12 cues for positioning and pacing  Swiss ball fwd flexion AAROM at table 2x10 cues for comfortable ROM  Swiss ball abduction AAROM at table x10 Finger ladder x5 (up to 23) cues to mitigate shrug    [facility] Adult PT Treatment:                                                DATE: 06/30/23 Therapeutic Exercise: Seated scapular retraction 3x10 Seated ER with dowel 2x10 Seated scaption with dowel 2x10 Reclined shoulder flexion with dowel AAROM 2x10 Reclined chest press with dowel AAROM 2x10 Reclined shoulder flexion AROM Rt 2x10 Reclined chest press AROM Rt 2x10 Reclined shoulder abduction AROM Rt 2x10 Isometrics into wall 5" hold ER/IR/Abd/Flex/Ext x10 ea Wall slide with towel flexion x10 Rt with gentle stretch at end range Rows YTB x10 Shoulder extension YTB x10 (small range)   [facility] Adult PT Treatment:                                                 DATE: 06/28/23 Therapeutic Exercise: Seated scapular retraction 3x10 Seated ER with dowel 2x10 Seated scaption with dowel 2x10 Reclined shoulder flexion with dowel AAROM 2x10 Reclined chest press with dowel AAROM 2x10 Reclined shoulder flexion AROM Rt 2x10 Reclined chest press AROM Rt 2x10 Reclined shoulder abduction AROM Rt x10 Isometrics into wall 5" hold ER/IR/Abd/Flex/Ext x10 ea Finger ladder AAROM flexion/abduction x5 ea - cues to decrease compensation of shoulder elevation Wall slide with towel flexion x10 Rt with gentle stretch at end range Rows YTB x10 Shoulder extension YTB (too painful)   [facility] Adult PT Treatment:  DATE: 06/23/23 Therapeutic Exercise: Seated scapular retraction 2x10 Seated ER with dowel 2x10 Seated scaption with dowel 2x10 Supine shoulder flexion with dowel AAROM 2x10 Supine chest press with dowel AAROM 2x10 Supine shoulder flexion AROM Rt 2x10 Supine chest press AROM Rt 2x10 Supine isometrics ER/IR/Abd/flex/ext 5" hold x10 ea Finger ladder AAROM flexion/abduction x5 ea - cues to decrease compensation of shoulder elevation Wall slide in pillowcase flexion x10 Rt   PATIENT EDUCATION: Education details: rationale for interventions, HEP  Person educated: Patient Education method: Explanation, Demonstration, Tactile cues, Verbal cues Education comprehension: verbalized understanding, returned demonstration, verbal cues required, tactile cues required, and needs further education     HOME EXERCISE PROGRAM: Access Code: [email or web address] URL: [email or web address] Date: 06/23/2023 Prepared by: Berta Minor  Exercises - Seated Scapular Retraction  - 2-3 x daily - 1 sets - 6-8 reps - Supine Shoulder Press  - 2-3 x daily - 1 sets - 6-8 reps - Supine Shoulder Flexion Extension Full Range AROM  - 1 x daily - 2-3 x weekly - 1 sets - 10 reps  ASSESSMENT:  CLINICAL  IMPRESSION: 07/04/2023 Pt arrives w/o pain, no issues after last session. Today continuing to work on building volume with shoulder mobility training. Also able to progress volume for lightly resisted periscapular work and addition of sidelying ER unresisted. No adverse events, some muscular fatigue as expected. Recommend continuing along current POC in order to address relevant deficits and improve functional tolerance. Pt departs today's session in no acute distress, all voiced questions/concerns addressed appropriately from PT perspective.      EVAL: Patient is a pleasant 58 y.o. woman who was seen today for physical therapy evaluation and treatment for R shoulder SAD, DCE and cuff debridement DOS 06/02/23, denies formal surgical restrictions. Endorses difficulty with ADLs and housework as expected post op, general soreness when using shoulder. On exam she demonstrates impaired GH strength/mobility as expected post op, PROM assessment limited primarily by muscle guarding. Requires cues/education for appropriate HEP performance, able to tolerate with mild soreness but no significant increases in pain. No adverse events. Recommend skilled PT to address aforementioned deficits with aim of improving tolerance to daily tasks and progress back to occupational tasks. Pt departs today's session in no acute distress, all voiced questions/concerns addressed appropriately from PT perspective.       OBJECTIVE IMPAIRMENTS: decreased activity tolerance, decreased endurance, decreased mobility, decreased ROM, decreased strength, impaired perceived functional ability, impaired UE functional use, and pain.   ACTIVITY LIMITATIONS: carrying, lifting, bathing, dressing, reach over head, and hygiene/grooming  PARTICIPATION LIMITATIONS: meal prep, cleaning, laundry, community activity, and occupation  PERSONAL FACTORS: Time since onset of injury/illness/exacerbation and 1-2 comorbidities: DM, HTN  are also affecting  patient's functional outcome.   REHAB POTENTIAL: Good  CLINICAL DECISION MAKING: Stable/uncomplicated  EVALUATION COMPLEXITY: Low   GOALS:  SHORT TERM GOALS: Target date: 07/13/2023  Pt will demonstrate appropriate understanding and performance of initially prescribed HEP in order to facilitate improved independence with management of symptoms.  Baseline: HEP established  Goal status: INITIAL   2. Pt will report at least 25% improvement in overall pain levels over past week in order to facilitate improved tolerance to typical daily activities.   Baseline: 0-9/10  Goal status: INITIAL  LONG TERM GOALS: Target date: 08/10/2023 Pt will score greater than 28 on PSFS in order to facilitate improved perception of function.   Baseline: 19   Goal status: INITIAL  2.  Pt will demonstrate at least 150 degrees of active  R shoulder elevation in order to demonstrate improved tolerance to functional movement patterns such as reaching overhead and into cabinets.  Baseline: see ROM chart above Goal status: INITIAL  3.  Pt will demonstrate at least 4+/5 shoulder flex/abd MMT for improved symmetry of UE strength and improved tolerance to functional movements.  Baseline: NT on eval Goal status: INITIAL  4. Pt will report at least 50% decrease in overall pain levels in past week in order to facilitate improved tolerance to basic ADLs/mobility.   Baseline: 0-9/10  Goal status: INITIAL     5. Pt will report ability to perform upper and lower body dressing with less than 3 pt increase in pain in order to facilitate improved tolerance to ADLs.  Baseline: inc pain with LBD/UBD  Goal status: INITIAL  6. Pt will be able to move 5# from waist to shoulder height for 5 repetitions in order to indicate improved functional shoulder endurance for work tasks.  Baseline: avoiding lifting tasks  Goal status: INITIAL   PLAN:  PT FREQUENCY: 1-2x/week  PT DURATION: 8 weeks  PLANNED INTERVENTIONS: 97164- PT  Re-evaluation, 97110-Therapeutic exercises, 97530- Therapeutic activity, 97112- Neuromuscular re-education, 97535- Self Care, 16109- Manual therapy, [id number]- Electrical stimulation (unattended), Patient/Family education, Balance training, Stair training, Taping, Dry Needling, Joint mobilization, Spinal mobilization, Scar mobilization, Cryotherapy, and Moist heat  PLAN FOR NEXT SESSION: Review/update HEP PRN. Work on [employer] exercises as appropriate with emphasis on shoulder PROM/AAROM initially, reducing muscle guarding. Periscapular activation and postural endurance within pt tolerance. Symptom modification strategies as indicated/appropriate.    Ashley Murrain PT, DPT 07/04/2023 3:35 PM

## 2023-07-04 ENCOUNTER — Ambulatory Visit: Admitting: Physical Therapy

## 2023-07-04 ENCOUNTER — Encounter: Payer: Self-pay | Admitting: Physical Therapy

## 2023-07-04 DIAGNOSIS — M6281 Muscle weakness (generalized): Secondary | ICD-10-CM

## 2023-07-04 DIAGNOSIS — M25511 Pain in right shoulder: Secondary | ICD-10-CM | POA: Diagnosis not present

## 2023-07-05 NOTE — Therapy (Signed)
 OUTPATIENT PHYSICAL THERAPY TREATMENT NOTE   Patient Name: Mackenzie Clark MRN: [id number] DOB:[birth year], 58 y.o., female Today's Date: 07/06/2023  END OF SESSION:  PT End of Session - 07/06/23 1404     Visit Number 7    Number of Visits 17    Date for PT Re-Evaluation 08/10/23    Authorization Type workers comp    Authorization Time Period Approved 1 evaluation and 12 therapy visits    Authorization - Visit Number 6    Authorization - Number of Visits 12    PT Start Time 1404    PT Stop Time 1443    PT Time Calculation (min) 39 min    Activity Tolerance Patient tolerated treatment well              Past Medical History:  Diagnosis Date   Allergy    Arthritis    r shoulder   Diabetes mellitus without complication (HCC)    diet and exercise controlled   Hypertension    Past Surgical History:  Procedure Laterality Date   DILATION AND CURETTAGE OF UTERUS  04/05/2004   16 week stillbirth   IR ANGIOGRAM PELVIS SELECTIVE OR SUPRASELECTIVE  08/31/2019   IR ANGIOGRAM SELECTIVE EACH ADDITIONAL VESSEL  08/31/2019   IR EMBO TUMOR ORGAN ISCHEMIA INFARCT INC GUIDE ROADMAPPING  08/31/2019   IR FLUORO GUIDED NEEDLE PLC ASPIRATION/INJECTION LOC  08/31/2019   IR RADIOLOGIST EVAL & MGMT  07/10/2019   IR RADIOLOGIST EVAL & MGMT  09/18/2019   IR US GUIDE VASC ACCESS RIGHT  08/31/2019   TONSILLECTOMY     and adnoids   Patient Active Problem List   Diagnosis Date Noted   History of hematuria 03/01/2023   Encounter for general adult medical examination w/o abnormal findings 11/01/2022   Aortic atherosclerosis (HCC) 11/01/2022   Anxiety 11/01/2022   Vitamin D deficiency disease 11/01/2022   Irregular menses 11/01/2022   Sprain of right ankle 11/01/2022   Pure hypercholesterolemia 05/06/2022   Overweight with body mass index (BMI) of 29 to 29.9 in adult 01/22/2021   Dyslipidemia associated with type 2 diabetes mellitus (HCC) 01/22/2021   Fibroids 08/31/2019   Abnormal uterine  bleeding due to intramural leiomyoma 08/31/2019   Abnormal uterine bleeding (AUB) 05/21/2019   Uterine leiomyoma 05/21/2019   Body mass index (BMI) of 30.0-30.9 in adult 09/03/2017   Type II diabetes mellitus, uncontrolled 09/03/2017   Obesity 09/03/2017   Hypertensive heart disease 03/05/2013   Surveillance of previously prescribed contraceptive pill 03/05/2013    PCP: Dorothyann Peng, MD  REFERRING PROVIDER: Jones Broom, MD  REFERRING DIAG: S/P RIGHT SHOULDER SAD , DCE , CUFF DEBRIDEMENT  THERAPY DIAG:  Right shoulder pain, unspecified chronicity  Muscle weakness (generalized)  Acute pain of right shoulder  Rationale for Evaluation and Treatment: Rehabilitation  ONSET DATE: R shoulder cuff debridement, SAD, DCE DOS 06/02/23  Next MD appt: Next month per pt report  SUBJECTIVE:  SUBJECTIVE STATEMENT: 07/06/2023 reports a little bit of aching in lateral shoulder, 3/10. Otherwise feeling pretty good. Did okay with exercises with yesterday.    EVAL: Pt states she is having some shoulder soreness, states follow up with surgeon went well. Pt states she is out of work right now, denies formal restrictions. States she has limitations with ADLs as below, limited primarily by soreness. No N/T, no significant swelling, no redness, no fevers/chills, no drainage from portals.  Hand dominance: Left  PERTINENT HISTORY: HTN, DM2  PAIN:  Are you having pain: 3/10 lateral R shoulder  Per eval -  Location/description: R shoulder Best-worst over past week: 0-9/10  - aggravating factors: upper body dressing, tying shoes, washing back, doing hair, sleeping - Easing factors: brace, ice pack     PRECAUTIONS: post op R shoulder ; per paper referral from physician okay for PROM/AAROM/AROM, strengthening   RED  FLAGS: None   WEIGHT BEARING RESTRICTIONS: No  FALLS:  Has patient fallen in last 6 months? No  LIVING ENVIRONMENT: 2 story home, no issues with stairs, lives with son . 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  DIAGNOSTIC FINDINGS:  DOS 06/02/23 R shoulder SAD, DCE, cuff debridement  PATIENT SURVEYS:  Patient-specific activity scoring scheme (Point to one number):  "0" represents "unable to perform." "10" represents "able to perform at prior level. 0 1 2 3 4 5 6 7 8 9  10 (Date and Score) Activity Initial  Activity Eval     Fasten bra  5    Hair (wash)  0    Wash back 5   Tie shoes 5   Reach into cabinets 4    Total score = sum of the activity scores/number of activities Minimum detectable change (90%CI) for average score = 2 points Minimum detectable change (90%CI) for single activity score = 3 points Score: 19  COGNITION: Overall cognitive status: Within functional limits for tasks assessed     SENSATION: Denies sensory complaints  POSTURE: Mild guarding RUE  UPPER EXTREMITY ROM:  A/PROM Right eval Left eval Right 06/28/23 Right 07/06/23  Shoulder flexion A: 109 deg  162 A: 120 AA: 146  A: 143 deg   Shoulder abduction A: 128 deg     Shoulder internal rotation       Shoulder external rotation       Elbow flexion      Elbow extension      Wrist flexion      Wrist extension       (Blank rows = not tested) (Key: WFL = within functional limits not formally assessed, * = concordant pain, s = stiffness/stretching sensation, NT = not tested)  Comments: PROM testing limited by muscle guarding  UPPER EXTREMITY MMT:  MMT Right eval Left eval  Shoulder flexion    Shoulder extension     Shoulder abduction    Shoulder extension    Shoulder internal rotation    Shoulder external rotation    Elbow flexion    Elbow extension    Grip strength    (Blank rows = not tested)  (Key: WFL = within functional limits not formally assessed, * = concordant pain, s = stiffness/stretching sensation, NT = not tested)  Comments: deferred  given proximity to surgery and pain w/ active mobility  PALPATION/OBSERVATION:  3 portal incisions all well appearing - no redness, swelling, drainage                                                                                                                             TREATMENT DATE:  [facility] Adult PT Treatment:                                                DATE: 07/06/23 Therapeutic Exercise: Reclined shoulder flexion w/ dowel 2x12 Reclined serratus punch 1# 2x8 cues for reduced elbow compensations Shoulder ER iso x6 w 3 sec hold cues for reduced compensations YTB row x10, red band row x8  Bicep curl 1# x12, 2# x8 HEP update + education/handout  Therapeutic Activity: Reclined shoulder flexion AROM x12 for reaching tolerance UE ranger flexion 2x8 for reaching tolerance, cues for setup and posture UE ranger abduction 2x8 for reaching tolerance    [facility] Adult PT Treatment:                                                DATE: 07/04/23 Therapeutic Exercise: Seated scap retraction x15 Yellow band row 2x10 cues for comfortable ROM/setup Reclined shoulder flex AAROM hands clasped 2x12 cues for pacing Reclined serratus punch 2x8, 1#x8 Sidelying ER AROM to neutral 2x12 cues for positioning and pacing  Swiss ball fwd flexion AAROM at table 2x10 cues for comfortable ROM  Swiss ball abduction AAROM at table x10 Finger ladder x5 (up to 23) cues to mitigate shrug    [facility] Adult PT Treatment:                                                DATE: 06/30/23 Therapeutic Exercise: Seated scapular retraction 3x10 Seated ER with dowel 2x10 Seated scaption  with dowel 2x10 Reclined shoulder flexion with dowel AAROM 2x10 Reclined chest press with dowel AAROM 2x10 Reclined shoulder flexion AROM Rt 2x10 Reclined chest press AROM Rt 2x10 Reclined shoulder abduction AROM Rt 2x10 Isometrics into wall 5" hold ER/IR/Abd/Flex/Ext x10 ea Wall slide with towel flexion x10 Rt with gentle stretch at end range Rows YTB x10 Shoulder extension YTB x10 (small range)    PATIENT EDUCATION: Education details: rationale for interventions, HEP  Person educated: Patient Education method: Explanation, Demonstration, Tactile cues, Verbal cues Education comprehension: verbalized understanding, returned demonstration, verbal cues required, tactile cues required, and needs further education     HOME EXERCISE PROGRAM: Access Code: [email or web address] URL: [email or web address] Date: 07/06/2023 Prepared by: Fransisco Hertz  Exercises - Seated Scapular Retraction  - 2-3 x daily - 1 sets - 6-8 reps - Supine Shoulder Press  - 2-3 x daily - 1 sets - 6-8 reps - Supine Shoulder Flexion Extension Full Range AROM  - 2-3 x daily - 1 sets - 10 reps - Standing Isometric Shoulder External Rotation with Doorway and Towel Roll  - 2-3 x daily - 1 sets - 6 reps  ASSESSMENT:  CLINICAL IMPRESSION: 07/06/2023 Pt arrives w/ 3/10 pain in lateral shoulder. Today she demonstrates good improvement in shoulder AROM/AAROM although remains with painful arc and end range pain. We are able to progress volume for assisted reaching tasks, modifications back to isometrics for ER training given some discomfort after last session. Tolerates today's session well without any increases in pain, reports reduced effort compared to last session. Recommend continuing along current POC in order to address relevant deficits and improve functional tolerance. Pt departs today's session in no acute distress, all voiced questions/concerns addressed appropriately from PT perspective.      EVAL: Patient is a  pleasant 58 y.o. woman who was seen today for physical therapy evaluation and treatment for R shoulder SAD, DCE and cuff debridement DOS 06/02/23, denies formal surgical restrictions. Endorses difficulty with ADLs and housework as expected post op, general soreness when using shoulder. On exam she demonstrates impaired GH strength/mobility as expected post op, PROM assessment limited primarily by muscle guarding. Requires cues/education for appropriate HEP performance, able to tolerate with mild soreness but no significant increases in pain. No adverse events. Recommend skilled PT to address aforementioned deficits with aim of improving tolerance to daily tasks and progress back to occupational tasks. Pt departs today's session in no acute distress, all voiced questions/concerns addressed appropriately from PT perspective.       OBJECTIVE IMPAIRMENTS: decreased activity tolerance, decreased endurance, decreased mobility, decreased ROM, decreased strength, impaired perceived functional ability, impaired UE functional use, and pain.   ACTIVITY LIMITATIONS: carrying, lifting, bathing, dressing, reach over head, and hygiene/grooming  PARTICIPATION LIMITATIONS: meal prep, cleaning, laundry, community activity, and occupation  PERSONAL FACTORS: Time since onset of injury/illness/exacerbation and 1-2 comorbidities: DM, HTN  are also affecting patient's functional outcome.   REHAB POTENTIAL: Good  CLINICAL DECISION MAKING: Stable/uncomplicated  EVALUATION COMPLEXITY: Low   GOALS:  SHORT TERM GOALS: Target date: 07/13/2023  Pt will demonstrate appropriate understanding and performance of initially prescribed HEP in order to facilitate improved independence with management of symptoms.  Baseline: HEP established  Goal status: INITIAL   2. Pt will report at least 25% improvement in overall pain levels over past week in order to facilitate improved tolerance to typical daily activities.   Baseline:  0-9/10  Goal status: INITIAL  LONG TERM GOALS: Target date: 08/10/2023 Pt will score greater than 28 on PSFS in order to facilitate improved perception of function.   Baseline: 19   Goal status: INITIAL  2.  Pt will demonstrate at least 150 degrees of active R shoulder elevation in order to demonstrate improved tolerance to functional movement patterns such as reaching overhead and into cabinets.  Baseline: see ROM chart above Goal status: INITIAL  3.  Pt will demonstrate at least 4+/5 shoulder flex/abd MMT for improved symmetry of UE strength and improved tolerance to functional movements.  Baseline: NT on eval Goal status: INITIAL  4. Pt will report at least 50% decrease in overall pain levels in past week in order to facilitate improved tolerance to basic ADLs/mobility.   Baseline: 0-9/10  Goal status: INITIAL     5. Pt will report ability to perform upper and lower body dressing with less than 3 pt increase in pain in order to facilitate improved tolerance to ADLs.  Baseline: inc pain with LBD/UBD  Goal status: INITIAL  6. Pt will be able to move 5# from waist to shoulder height for 5 repetitions in order to indicate improved functional shoulder endurance for work tasks.  Baseline: avoiding lifting tasks  Goal status: INITIAL   PLAN:  PT FREQUENCY: 1-2x/week  PT DURATION: 8 weeks  PLANNED INTERVENTIONS: 97164- PT Re-evaluation, 97110-Therapeutic exercises, 97530- Therapeutic activity, 97112- Neuromuscular re-education, 97535- Self Care, [id number]- Manual therapy, [id number]- Electrical stimulation (unattended), Patient/Family education, Balance training, Stair training, Taping, Dry Needling, Joint mobilization, Spinal mobilization, Scar mobilization, Cryotherapy, and Moist heat  PLAN FOR NEXT SESSION: Review/update HEP PRN. Work on [employer] exercises as appropriate with emphasis on shoulder PROM/AAROM initially, reducing muscle guarding. Periscapular activation and postural endurance  within pt tolerance. Symptom modification strategies as indicated/appropriate.    Ashley Murrain PT, DPT 07/06/2023 3:54 PM

## 2023-07-06 ENCOUNTER — Encounter: Payer: Self-pay | Admitting: Physical Therapy

## 2023-07-06 ENCOUNTER — Ambulatory Visit: Payer: Self-pay | Attending: Orthopedic Surgery | Admitting: Physical Therapy

## 2023-07-06 DIAGNOSIS — M25511 Pain in right shoulder: Secondary | ICD-10-CM | POA: Insufficient documentation

## 2023-07-06 DIAGNOSIS — M6281 Muscle weakness (generalized): Secondary | ICD-10-CM | POA: Diagnosis present

## 2023-07-10 ENCOUNTER — Other Ambulatory Visit (HOSPITAL_COMMUNITY): Payer: Self-pay

## 2023-07-11 ENCOUNTER — Ambulatory Visit: Payer: Commercial Managed Care - PPO | Admitting: Internal Medicine

## 2023-07-11 ENCOUNTER — Encounter: Payer: Self-pay | Admitting: Internal Medicine

## 2023-07-11 VITALS — BP 120/80 | HR 75 | Temp 98.7°F | Ht 67.0 in | Wt 175.0 lb

## 2023-07-11 DIAGNOSIS — E785 Hyperlipidemia, unspecified: Secondary | ICD-10-CM | POA: Diagnosis not present

## 2023-07-11 DIAGNOSIS — E1169 Type 2 diabetes mellitus with other specified complication: Secondary | ICD-10-CM

## 2023-07-11 DIAGNOSIS — D649 Anemia, unspecified: Secondary | ICD-10-CM | POA: Diagnosis not present

## 2023-07-11 DIAGNOSIS — I700 Atherosclerosis of aorta: Secondary | ICD-10-CM

## 2023-07-11 DIAGNOSIS — I119 Hypertensive heart disease without heart failure: Secondary | ICD-10-CM

## 2023-07-11 DIAGNOSIS — Z9889 Other specified postprocedural states: Secondary | ICD-10-CM | POA: Diagnosis not present

## 2023-07-11 NOTE — Therapy (Signed)
 OUTPATIENT PHYSICAL THERAPY TREATMENT NOTE   Patient Name: Mackenzie Clark MRN: [id number] DOB:May 03, 1965, 58 y.o., female Today's Date: 07/12/2023  END OF SESSION:  PT End of Session - 07/12/23 1449     Visit Number 8    Number of Visits 17    Date for PT Re-Evaluation 08/10/23    Authorization Type workers comp    Authorization Time Period Approved 1 evaluation and 12 therapy visits    Authorization - Visit Number 7    Authorization - Number of Visits 12    PT Start Time 1449    PT Stop Time 1530    PT Time Calculation (min) 41 min    Activity Tolerance Patient tolerated treatment well               Past Medical History:  Diagnosis Date   Allergy    Arthritis    r shoulder   Diabetes mellitus without complication (HCC)    diet and exercise controlled   Hypertension    Past Surgical History:  Procedure Laterality Date   DILATION AND CURETTAGE OF UTERUS  04/05/2004   16 week stillbirth   IR ANGIOGRAM PELVIS SELECTIVE OR SUPRASELECTIVE  08/31/2019   IR ANGIOGRAM SELECTIVE EACH ADDITIONAL VESSEL  08/31/2019   IR EMBO TUMOR ORGAN ISCHEMIA INFARCT INC GUIDE ROADMAPPING  08/31/2019   IR FLUORO GUIDED NEEDLE PLC ASPIRATION/INJECTION LOC  08/31/2019   IR RADIOLOGIST EVAL & MGMT  07/10/2019   IR RADIOLOGIST EVAL & MGMT  09/18/2019   IR US GUIDE VASC ACCESS RIGHT  08/31/2019   right shoulder arthroscopy Right 06/02/2023   TONSILLECTOMY     and adnoids   Patient Active Problem List   Diagnosis Date Noted   History of hematuria 03/01/2023   Encounter for general adult medical examination w/o abnormal findings 11/01/2022   Aortic atherosclerosis (HCC) 11/01/2022   Anxiety 11/01/2022   Vitamin D deficiency disease 11/01/2022   Irregular menses 11/01/2022   Sprain of right ankle 11/01/2022   Pure hypercholesterolemia 05/06/2022   Overweight with body mass index (BMI) of 29 to 29.9 in adult 01/22/2021   Dyslipidemia associated with type 2 diabetes mellitus (HCC)  01/22/2021   Fibroids 08/31/2019   Abnormal uterine bleeding due to intramural leiomyoma 08/31/2019   Abnormal uterine bleeding (AUB) 05/21/2019   Uterine leiomyoma 05/21/2019   Body mass index (BMI) of 30.0-30.9 in adult 09/03/2017   Type II diabetes mellitus, uncontrolled 09/03/2017   Obesity 09/03/2017   Hypertensive heart disease 03/05/2013   Surveillance of previously prescribed contraceptive pill 03/05/2013    PCP: Dorothyann Peng, MD  REFERRING PROVIDER: Jones Broom, MD  REFERRING DIAG: S/P RIGHT SHOULDER SAD , DCE , CUFF DEBRIDEMENT  THERAPY DIAG:  Right shoulder pain, unspecified chronicity  Muscle weakness (generalized)  Rationale for Evaluation and Treatment: Rehabilitation  ONSET DATE: R shoulder cuff debridement, SAD, DCE DOS 06/02/23  Next MD appt: Next month per pt report  SUBJECTIVE:  SUBJECTIVE STATEMENT: 07/12/2023 Pt states she did have a bit of extra pain after last session, peaked over the weekend. Also had some RUE numbness in forearm/fingers while typing on Sunday. No other new updates  EVAL: Pt states she is having some shoulder soreness, states follow up with surgeon went well. Pt states she is out of work right now, denies formal restrictions. States she has limitations with ADLs as below, limited primarily by soreness. No N/T, no significant swelling, no redness, no fevers/chills, no drainage from portals.  Hand dominance: Left  PERTINENT HISTORY: HTN, DM2  PAIN:  Are you having pain: 4/10 Worst in past week: 7/10   Per eval -  Location/description: R shoulder Best-worst over past week: 0-9/10  - aggravating factors: upper body dressing, tying shoes, washing back, doing hair, sleeping - Easing factors: brace, ice pack     PRECAUTIONS: post op R shoulder ; per  paper referral from physician okay for PROM/AAROM/AROM, strengthening   RED FLAGS: None   WEIGHT BEARING RESTRICTIONS: No  FALLS:  Has patient fallen in last 6 months? No  LIVING ENVIRONMENT: 2 story home, no issues with stairs, lives with son . 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  DIAGNOSTIC FINDINGS:  DOS 06/02/23 R shoulder SAD, DCE, cuff debridement  PATIENT SURVEYS:  Patient-specific activity scoring scheme (Point to one number):  "0" represents "unable to perform." "10" represents "able to perform at prior level. 0 1 2 3 4 5 6 7 8 9  10 (Date and Score) Activity Initial  Activity Eval     Fasten bra  5    Hair (wash)  0    Wash back 5   Tie shoes 5   Reach into cabinets 4    Total score = sum of the activity scores/number of activities Minimum detectable change (90%CI) for average score = 2 points Minimum detectable change (90%CI) for single activity score = 3 points Score: 19  COGNITION: Overall cognitive status: Within functional limits for tasks assessed     SENSATION: Denies sensory complaints  POSTURE: Mild guarding RUE  UPPER EXTREMITY ROM:  A/PROM Right eval Left eval Right 06/28/23 Right 07/06/23  Shoulder flexion A: 109 deg  162 A: 120 AA: 146  A: 143 deg   Shoulder abduction A: 128 deg     Shoulder internal rotation       Shoulder external rotation       Elbow flexion      Elbow extension      Wrist flexion      Wrist extension       (Blank rows = not tested) (Key: WFL = within functional limits not formally assessed, * = concordant pain, s = stiffness/stretching sensation, NT = not tested)  Comments: PROM testing limited by muscle guarding  UPPER EXTREMITY  MMT:  MMT Right eval Left eval  Shoulder flexion    Shoulder extension    Shoulder abduction    Shoulder extension    Shoulder internal rotation    Shoulder external rotation    Elbow flexion    Elbow extension    Grip strength    (Blank rows = not tested)  (Key: WFL = within functional limits not formally assessed, * =  concordant pain, s = stiffness/stretching sensation, NT = not tested)  Comments: deferred given proximity to surgery and pain w/ active mobility  PALPATION/OBSERVATION:  3 portal incisions all well appearing - no redness, swelling, drainage                                                                                                                             TREATMENT DATE:  [facility] Adult PT Treatment:                                                DATE: 07/12/23 Therapeutic Exercise: Supine flexion AAROM hands clasped 2x15 Prone scap retraction 2x8 cues for setup Supine chest press w/ dowel AAROM x10 Supine dowel serratus punch AAROM x8  Shoulder flex isometric submax x5 cues for setup, positioning Shoulder ER isometric submax x5 cues for setup and appropriate force output    [facility] Adult PT Treatment:                                                DATE: 07/06/23 Therapeutic Exercise: Reclined shoulder flexion w/ dowel 2x12 Reclined serratus punch 1# 2x8 cues for reduced elbow compensations Shoulder ER iso x6 w 3 sec hold cues for reduced compensations YTB row x10, red band row x8  Bicep curl 1# x12, 2# x8 HEP update + education/handout  Therapeutic Activity: Reclined shoulder flexion AROM x12 for reaching tolerance UE ranger flexion 2x8 for reaching tolerance, cues for setup and posture UE ranger abduction 2x8 for reaching tolerance    [facility] Adult PT Treatment:                                                DATE: 07/04/23 Therapeutic Exercise: Seated scap retraction x15 Yellow band row 2x10 cues for comfortable ROM/setup Reclined shoulder flex AAROM  hands clasped 2x12 cues for pacing Reclined serratus punch 2x8, 1#x8 Sidelying ER AROM to neutral 2x12 cues for positioning and pacing  Swiss ball fwd flexion AAROM at table 2x10 cues for comfortable ROM  Swiss ball abduction AAROM at table x10 Finger ladder x5 (up to 23) cues to mitigate shrug    PATIENT EDUCATION: Education details: rationale for interventions, HEP  Person educated: Patient Education method: Explanation, Demonstration, Tactile cues, Verbal cues Education comprehension: verbalized understanding, returned demonstration, verbal cues required, tactile cues required, and needs further education     HOME EXERCISE PROGRAM: Access Code: [email or web address] URL: [email or web address] Date: 07/06/2023 Prepared by: Fransisco Hertz  Exercises - Seated Scapular Retraction  - 2-3 x daily - 1 sets -  6-8 reps - Supine Shoulder Press  - 2-3 x daily - 1 sets - 6-8 reps - Supine Shoulder Flexion Extension Full Range AROM  - 2-3 x daily - 1 sets - 10 reps - Standing Isometric Shoulder External Rotation with Doorway and Towel Roll  - 2-3 x daily - 1 sets - 6 reps  ASSESSMENT:  CLINICAL IMPRESSION: 07/12/2023 Pt arrives w/ report of increased pain after last session that peaked over the weekend. Given this, today we make modifications for reduced demand w/ shoulder mobility training, modifications for periscapular work. She continues to endorse good tolerance overall within session but does note that she typically does not feel any increase in pain until afterwards. She does endorse some RUE numbness, anterior forearm into digits 3-5, that occurs after prone scap retraction. States this also occurred over the weekend, encouraged close monitoring. No adverse events, denies any increase in pain on departure. Recommend continuing along current POC in order to address relevant deficits and improve functional tolerance. Pt departs today's session in no acute distress, all voiced  questions/concerns addressed appropriately from PT perspective.     EVAL: Patient is a pleasant 58 y.o. woman who was seen today for physical therapy evaluation and treatment for R shoulder SAD, DCE and cuff debridement DOS 06/02/23, denies formal surgical restrictions. Endorses difficulty with ADLs and housework as expected post op, general soreness when using shoulder. On exam she demonstrates impaired GH strength/mobility as expected post op, PROM assessment limited primarily by muscle guarding. Requires cues/education for appropriate HEP performance, able to tolerate with mild soreness but no significant increases in pain. No adverse events. Recommend skilled PT to address aforementioned deficits with aim of improving tolerance to daily tasks and progress back to occupational tasks. Pt departs today's session in no acute distress, all voiced questions/concerns addressed appropriately from PT perspective.       OBJECTIVE IMPAIRMENTS: decreased activity tolerance, decreased endurance, decreased mobility, decreased ROM, decreased strength, impaired perceived functional ability, impaired UE functional use, and pain.   ACTIVITY LIMITATIONS: carrying, lifting, bathing, dressing, reach over head, and hygiene/grooming  PARTICIPATION LIMITATIONS: meal prep, cleaning, laundry, community activity, and occupation  PERSONAL FACTORS: Time since onset of injury/illness/exacerbation and 1-2 comorbidities: DM, HTN  are also affecting patient's functional outcome.   REHAB POTENTIAL: Good  CLINICAL DECISION MAKING: Stable/uncomplicated  EVALUATION COMPLEXITY: Low   GOALS:  SHORT TERM GOALS: Target date: 07/13/2023  Pt will demonstrate appropriate understanding and performance of initially prescribed HEP in order to facilitate improved independence with management of symptoms.  Baseline: HEP established  07/12/23: reports good HEP adherence Goal status: MET  2. Pt will report at least 25% improvement in  overall pain levels over past week in order to facilitate improved tolerance to typical daily activities.   Baseline: 0-9/10 07/12/23: 0-7/10 Goal status: NEARLY MET   LONG TERM GOALS: Target date: 08/10/2023 Pt will score greater than 28 on PSFS in order to facilitate improved perception of function.   Baseline: 19   Goal status: INITIAL  2.  Pt will demonstrate at least 150 degrees of active R shoulder elevation in order to demonstrate improved tolerance to functional movement patterns such as reaching overhead and into cabinets.  Baseline: see ROM chart above Goal status: INITIAL  3.  Pt will demonstrate at least 4+/5 shoulder flex/abd MMT for improved symmetry of UE strength and improved tolerance to functional movements.  Baseline: NT on eval Goal status: INITIAL  4. Pt will report at least 50% decrease  in overall pain levels in past week in order to facilitate improved tolerance to basic ADLs/mobility.   Baseline: 0-9/10  Goal status: INITIAL     5. Pt will report ability to perform upper and lower body dressing with less than 3 pt increase in pain in order to facilitate improved tolerance to ADLs.  Baseline: inc pain with LBD/UBD  Goal status: INITIAL  6. Pt will be able to move 5# from waist to shoulder height for 5 repetitions in order to indicate improved functional shoulder endurance for work tasks.  Baseline: avoiding lifting tasks  Goal status: INITIAL   PLAN:  PT FREQUENCY: 1-2x/week  PT DURATION: 8 weeks  PLANNED INTERVENTIONS: 97164- PT Re-evaluation, 97110-Therapeutic exercises, 97530- Therapeutic activity, 97112- Neuromuscular re-education, 97535- Self Care, 86578- Manual therapy, [id number]- Electrical stimulation (unattended), Patient/Family education, Balance training, Stair training, Taping, Dry Needling, Joint mobilization, Spinal mobilization, Scar mobilization, Cryotherapy, and Moist heat  PLAN FOR NEXT SESSION: Review/update HEP PRN. Work on [employer]  exercises as appropriate with emphasis on shoulder PROM/AAROM initially, reducing muscle guarding. Periscapular activation and postural endurance within pt tolerance. Symptom modification strategies as indicated/appropriate.    Ashley Murrain PT, DPT 07/12/2023 3:40 PM

## 2023-07-11 NOTE — Progress Notes (Unsigned)
 I,Jameka J Llittleton, CMA,acting as a Neurosurgeon for [name], MD.,have documented all relevant documentation on the behalf of [name], MD,as directed by  [name], MD while in the presence of [name], MD.  Subjective:  Patient ID: Mackenzie Clark , female    DOB: 27-Dec-1965 , 58 y.o.   MRN: [id number]  Chief Complaint  Patient presents with   Hypertension   Diabetes    Pt presents today for diabetes & bpc. She reports compliance with medications. Denies headache, chest pain & sob. She is concerned about her blood sugars.     HPI Discussed the use of AI scribe software for clinical note transcription with the patient, who gave verbal consent to proceed.  History of Present Illness The patient presents for follow-up regarding shoulder surgery and diabetes management.  She is following up after undergoing right shoulder arthroscopic debridement surgery on June 02, 2023, at [facility]. The surgery was necessitated by a fall and subsequent pain that began around Christmas. She had previously received a shot in the summer, which was ineffective, leading to the decision for surgery. She is currently attending physical therapy and performing exercises at home. She is unsure when she will return to work and has a follow-up appointment with her doctor on Friday.  Regarding diabetes management, she is not regularly checking her blood sugars. She experienced a low blood sugar reading of 47 during a pre-appointment for her shoulder surgery, attributed to not eating. She did not feel unwell at the time and was given graham crackers and peanut butter to address the low sugar. She is currently taking Mounjaro for her diabetes and has noticed a decrease in appetite, leading to eating only once a day. This may be contributing to low blood sugar levels and potential malnourishment.  She is taking amlodipine 10 mg and olmesartan with HCT for blood pressure management. She reports a  recent finding of low potassium levels and is considering adjusting her medication. She is also taking Wellbutrin (bupropion) and uses a nasal spray as needed.       Hypertension This is a chronic problem. The current episode started more than 1 year ago. The problem has been gradually improving since onset. The problem is controlled. Pertinent negatives include no blurred vision or chest pain.  Diabetes She presents for her follow-up diabetic visit. She has type 2 diabetes mellitus. Her disease course has been stable. There are no hypoglycemic associated symptoms. Pertinent negatives for diabetes include no blurred vision, no chest pain, no polydipsia, no polyphagia and no polyuria. There are no hypoglycemic complications. Risk factors for coronary artery disease include diabetes mellitus, hypertension and obesity. She is compliant with treatment most of the time. She participates in exercise intermittently. An ACE inhibitor/angiotensin II receptor blocker is being taken. Eye exam is current.     Past Medical History:  Diagnosis Date   Allergy    Arthritis    r shoulder   Diabetes mellitus without complication (HCC)    diet and exercise controlled   Hypertension      Family History  Problem Relation Age of Onset   Hypertension Mother    Hyperlipidemia Mother    Hypertension Father    Diabetes Father    Heart disease Father        heart attack age 52   Hypertension Brother    Cancer Maternal Grandmother 81       Breast   Obesity Other    Colon  cancer Neg Hx    Colon polyps Neg Hx    Esophageal cancer Neg Hx    Rectal cancer Neg Hx    Stomach cancer Neg Hx      Current Outpatient Medications:    amLODipine (NORVASC) 10 MG tablet, Take 1 tablet (10 mg total) by mouth daily., Disp: 90 tablet, Rfl: 2   Bioflavonoid Products (VITAMIN C) CHEW, Chew 1 tablet by mouth 3 (three) times a week., Disp: , Rfl:    buPROPion (WELLBUTRIN XL) 150 MG 24 hr tablet, Take 1 tablet (150 mg  total) by mouth every morning., Disp: 90 tablet, Rfl: 1   diclofenac Sodium (VOLTAREN ARTHRITIS PAIN) 1 % GEL, Apply 2 g topically 4 (four) times daily. (Patient taking differently: Apply 2 g topically 4 (four) times daily as needed (pain).), Disp: 100 g, Rfl: 0   fluticasone (FLONASE) 50 MCG/ACT nasal spray, Place 2 sprays into both nostrils daily. (Patient taking differently: Place 2 sprays into both nostrils daily as needed for allergies.), Disp: 54 g, Rfl: 2   glucose blood test strip, 1 each by Other route 2 (two) times daily. Use as instructed, Disp: , Rfl:    olmesartan-hydrochlorothiazide (BENICAR HCT) 40-25 MG tablet, Take 1 tablet by mouth daily., Disp: 90 tablet, Rfl: 2   rosuvastatin (CRESTOR) 5 MG tablet, Take 1 tablet (5 mg total) by mouth daily., Disp: 90 tablet, Rfl: 3   tirzepatide (MOUNJARO) 15 MG/0.5ML Pen, Inject 15 mg into the skin once a week., Disp: 2 mL, Rfl: 1   valACYclovir (VALTREX) 1000 MG tablet, Take 1 tablet (1,000 mg total) by mouth 2 (two) times daily as needed., Disp: 30 tablet, Rfl: 0   Allergies  Allergen Reactions   Shellfish Allergy Swelling     Review of Systems  Constitutional: Negative.   Eyes: Negative.  Negative for blurred vision.  Respiratory: Negative.    Cardiovascular: Negative.  Negative for chest pain.  Gastrointestinal: Negative.   Endocrine: Negative for polydipsia, polyphagia and polyuria.  Musculoskeletal:  Positive for arthralgias.       She is recovering from right shoulder surgery. She is currently in PT. She is not yet cleared to return to work.   Skin: Negative.   Psychiatric/Behavioral: Negative.       Today's Vitals   07/11/23 0828  BP: 120/80  Pulse: 75  Temp: 98.7 F (37.1 C)  TempSrc: Oral  Weight: 175 lb (79.4 kg)  Height: 5\' 7"  (1.702 m)  PainSc: 4   PainLoc: Shoulder   Body mass index is 27.41 kg/m.  Wt Readings from Last 3 Encounters:  07/11/23 175 lb (79.4 kg)  06/02/23 176 lb (79.8 kg)  05/27/23 176 lb  (79.8 kg)    The 10-year ASCVD risk score (Arnett DK, et al., 2019) is: 6.1%   Values used to calculate the score:     Age: 12 years     Sex: Female     Is Non-Hispanic African American: Yes     Diabetic: Yes     Tobacco smoker: No     Systolic Blood Pressure: 120 mmHg     Is BP treated: Yes     HDL Cholesterol: 77 mg/dL     Total Cholesterol: 181 mg/dL  Objective:  Physical Exam Vitals and nursing note reviewed.  Constitutional:      Appearance: Normal appearance.  HENT:     Head: Normocephalic and atraumatic.  Eyes:     Extraocular Movements: Extraocular movements intact.  Cardiovascular:  Rate and Rhythm: Normal rate and regular rhythm.     Heart sounds: Normal heart sounds.  Pulmonary:     Effort: Pulmonary effort is normal.     Breath sounds: Normal breath sounds.  Musculoskeletal:     Cervical back: Normal range of motion.  Skin:    General: Skin is warm.  Neurological:     General: No focal deficit present.     Mental Status: She is alert.  Psychiatric:        Mood and Affect: Mood normal.        Behavior: Behavior normal.         Assessment And Plan:  Dyslipidemia associated with type 2 diabetes mellitus (HCC) -     CMP14+EGFR -     Lipid panel  Hypertensive heart disease without heart failure -     CMP14+EGFR  Aortic atherosclerosis (HCC)  Postoperative anemia -     CBC  General Health Maintenance Up to date on shingles and Pneumovax 23 vaccines. Prefers to delay Prevnar due to recent arm pain. - Administer Prevnar vaccine at a later date when she is ready. Assessment and Plan Assessment & Plan Right Shoulder Pain Post-Surgery Post-arthroscopic debridement recovery with ongoing physical therapy. - Continue physical therapy. - Encourage home exercises for shoulder rehabilitation.      Return for controlled DM check-4 months.  Patient was given opportunity to ask questions. Patient verbalized understanding of the plan and was able to  repeat key elements of the plan. All questions were answered to their satisfaction.    I, [name], MD, have reviewed all documentation for this visit. The documentation on 07/11/23 for the exam, diagnosis, procedures, and orders are all accurate and complete.   IF YOU HAVE BEEN REFERRED TO A SPECIALIST, IT MAY TAKE 1-2 WEEKS TO SCHEDULE/PROCESS THE REFERRAL. IF YOU HAVE NOT HEARD FROM US/SPECIALIST IN TWO WEEKS, PLEASE GIVE [place] A CALL AT [phone].

## 2023-07-11 NOTE — Patient Instructions (Signed)

## 2023-07-11 NOTE — Assessment & Plan Note (Addendum)
 Asymptomatic hypoglycemia likely due to fasting. Emphasized regular meals to prevent hypoglycemia, especially with Mounjaro use. - Provide a glucose meter for home monitoring. - Encourage regular meals and protein intake. - Consider decreasing Mounjaro dose if glucose levels consistently fall below 70. -Today, she wants to continue with current Mounjaro dose, 15mg . Her last A1c February 2024 was 5.3. She definitely has room to decrease the dose.

## 2023-07-11 NOTE — Assessment & Plan Note (Signed)
 Blood pressure managed with amlodipine and olmesartan with HCT. Low potassium possibly dietary. Discussed diuretic adjustment if necessary. - Recheck potassium levels. - Consider removing the diuretic component from olmesartan if potassium remains low.

## 2023-07-12 ENCOUNTER — Ambulatory Visit: Payer: Self-pay | Admitting: Physical Therapy

## 2023-07-12 ENCOUNTER — Encounter: Payer: Self-pay | Admitting: Physical Therapy

## 2023-07-12 DIAGNOSIS — M25511 Pain in right shoulder: Secondary | ICD-10-CM

## 2023-07-12 DIAGNOSIS — M6281 Muscle weakness (generalized): Secondary | ICD-10-CM

## 2023-07-12 DIAGNOSIS — Z9889 Other specified postprocedural states: Secondary | ICD-10-CM | POA: Insufficient documentation

## 2023-07-12 LAB — CMP14+EGFR
ALT: 13 IU/L (ref 0–32)
AST: 13 IU/L (ref 0–40)
Albumin: 4.3 g/dL (ref 3.8–4.9)
Alkaline Phosphatase: 82 IU/L (ref 44–121)
BUN/Creatinine Ratio: 10 (ref 9–23)
BUN: 9 mg/dL (ref 6–24)
Bilirubin Total: 0.5 mg/dL (ref 0.0–1.2)
CO2: 26 mmol/L (ref 20–29)
Calcium: 9.9 mg/dL (ref 8.7–10.2)
Chloride: 100 mmol/L (ref 96–106)
Creatinine, Ser: 0.87 mg/dL (ref 0.57–1.00)
Globulin, Total: 2.2 g/dL (ref 1.5–4.5)
Glucose: 82 mg/dL (ref 70–99)
Potassium: 3.3 mmol/L — ABNORMAL LOW (ref 3.5–5.2)
Sodium: 141 mmol/L (ref 134–144)
Total Protein: 6.5 g/dL (ref 6.0–8.5)
eGFR: 79 mL/min/{1.73_m2} (ref >59)

## 2023-07-12 LAB — CBC
Hematocrit: 36.9 % (ref 34.0–46.6)
Hemoglobin: 12.3 g/dL (ref 11.1–15.9)
MCH: 29.4 pg (ref 26.6–33.0)
MCHC: 33.3 g/dL (ref 31.5–35.7)
MCV: 88 fL (ref 79–97)
Platelets: 268 10*3/uL (ref 150–450)
RBC: 4.18 x10E6/uL (ref 3.77–5.28)
RDW: 13.3 % (ref 11.7–15.4)
WBC: 3.9 10*3/uL (ref 3.4–10.8)

## 2023-07-12 LAB — LIPID PANEL
Chol/HDL Ratio: 2.3 ratio (ref 0.0–4.4)
Cholesterol, Total: 181 mg/dL (ref 100–199)
HDL: 80 mg/dL (ref >39)
LDL Chol Calc (NIH): 91 mg/dL (ref 0–99)
Triglycerides: 49 mg/dL (ref 0–149)
VLDL Cholesterol Cal: 10 mg/dL (ref 5–40)

## 2023-07-12 NOTE — Assessment & Plan Note (Signed)
 Post-arthroscopic debridement recovery with ongoing physical therapy. - Continue physical therapy. - Encourage home exercises for shoulder rehabilitation.

## 2023-07-12 NOTE — Assessment & Plan Note (Signed)
 Chronic, LDL goal < 70.  Started on rosuvastatin 5mg  by Cardiology.   Will recheck lipid panel.  If LDL is above goal, will likely need to increase dose to 10mg  M-Saturday.

## 2023-07-13 ENCOUNTER — Other Ambulatory Visit: Payer: Self-pay | Admitting: Internal Medicine

## 2023-07-13 ENCOUNTER — Other Ambulatory Visit (HOSPITAL_COMMUNITY): Payer: Self-pay

## 2023-07-13 MED ORDER — MOUNJARO 15 MG/0.5ML ~~LOC~~ SOAJ
15.0000 mg | SUBCUTANEOUS | 1 refills | Status: DC
  Filled 2023-07-13: qty 2, 28d supply, fill #0
  Filled 2023-08-21: qty 2, 28d supply, fill #1

## 2023-07-13 NOTE — Therapy (Signed)
 OUTPATIENT PHYSICAL THERAPY TREATMENT NOTE   Patient Name: Mackenzie Clark MRN: [id number] DOB:[birth year], 58 y.o., female Today's Date: 07/13/2023  END OF SESSION:      Past Medical History:  Diagnosis Date   Allergy    Arthritis    r shoulder   Diabetes mellitus without complication (HCC)    diet and exercise controlled   Hypertension    Past Surgical History:  Procedure Laterality Date   DILATION AND CURETTAGE OF UTERUS  04/05/2004   16 week stillbirth   IR ANGIOGRAM PELVIS SELECTIVE OR SUPRASELECTIVE  08/31/2019   IR ANGIOGRAM SELECTIVE EACH ADDITIONAL VESSEL  08/31/2019   IR EMBO TUMOR ORGAN ISCHEMIA INFARCT INC GUIDE ROADMAPPING  08/31/2019   IR FLUORO GUIDED NEEDLE PLC ASPIRATION/INJECTION LOC  08/31/2019   IR RADIOLOGIST EVAL & MGMT  07/10/2019   IR RADIOLOGIST EVAL & MGMT  09/18/2019   IR US GUIDE VASC ACCESS RIGHT  08/31/2019   right shoulder arthroscopy Right 06/02/2023   TONSILLECTOMY     and adnoids   Patient Active Problem List   Diagnosis Date Noted   Status post shoulder surgery 07/12/2023   History of hematuria 03/01/2023   Encounter for general adult medical examination w/o abnormal findings 11/01/2022   Aortic atherosclerosis (HCC) 11/01/2022   Anxiety 11/01/2022   Vitamin D deficiency disease 11/01/2022   Irregular menses 11/01/2022   Sprain of right ankle 11/01/2022   Pure hypercholesterolemia 05/06/2022   Overweight with body mass index (BMI) of 29 to 29.9 in adult 01/22/2021   Dyslipidemia associated with type 2 diabetes mellitus (HCC) 01/22/2021   Fibroids 08/31/2019   Abnormal uterine bleeding due to intramural leiomyoma 08/31/2019   Abnormal uterine bleeding (AUB) 05/21/2019   Uterine leiomyoma 05/21/2019   Body mass index (BMI) of 30.0-30.9 in adult 09/03/2017   Type II diabetes mellitus, uncontrolled 09/03/2017   Obesity 09/03/2017   Hypertensive heart disease 03/05/2013   Surveillance of previously prescribed contraceptive pill  03/05/2013    PCP: Dorothyann Peng, MD  REFERRING PROVIDER: Jones Broom, MD  REFERRING DIAG: S/P RIGHT SHOULDER SAD , DCE , CUFF DEBRIDEMENT  THERAPY DIAG:  No diagnosis found.  Rationale for Evaluation and Treatment: Rehabilitation  ONSET DATE: R shoulder cuff debridement, SAD, DCE DOS 06/02/23  Next MD appt: Next month per pt report  SUBJECTIVE:                                                                                                                                                                                      SUBJECTIVE STATEMENT: 07/13/2023 ***  *** Pt states she did have a bit of extra pain after last session, peaked  over the weekend. Also had some RUE numbness in forearm/fingers while typing on Sunday. No other new updates  EVAL: Pt states she is having some shoulder soreness, states follow up with surgeon went well. Pt states she is out of work right now, denies formal restrictions. States she has limitations with ADLs as below, limited primarily by soreness. No N/T, no significant swelling, no redness, no fevers/chills, no drainage from portals.  Hand dominance: Left  PERTINENT HISTORY: HTN, DM2  PAIN:  Are you having pain: 4/10 ***  Worst in past week: 7/10   Per eval -  Location/description: R shoulder Best-worst over past week: 0-9/10  - aggravating factors: upper body dressing, tying shoes, washing back, doing hair, sleeping - Easing factors: brace, ice pack     PRECAUTIONS: post op R shoulder ; per paper referral from physician okay for PROM/AAROM/AROM, strengthening   RED FLAGS: None   WEIGHT BEARING RESTRICTIONS: No  FALLS:  Has patient fallen in last 6 months? No  LIVING ENVIRONMENT: 2 story home, no issues with stairs, lives with son . 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  DIAGNOSTIC FINDINGS:  DOS 06/02/23 R shoulder SAD, DCE, cuff debridement  PATIENT SURVEYS:  Patient-specific activity scoring scheme (Point to one number):  "0" represents "unable to perform." "10" represents "able to perform at prior level. 0 1 2 3 4 5 6 7 8 9  10 (Date and Score) Activity Initial  Activity Eval     Fasten bra  5    Hair (wash)  0    Wash back 5   Tie shoes 5   Reach into cabinets 4    Total score = sum of the activity scores/number of activities Minimum detectable change (90%CI) for average score = 2 points Minimum detectable change (90%CI) for single activity score = 3 points Score: 19  COGNITION: Overall cognitive status: Within functional limits for tasks assessed     SENSATION: Denies sensory complaints  POSTURE: Mild guarding RUE  UPPER EXTREMITY ROM:  A/PROM Right eval Left eval Right 06/28/23 Right 07/06/23  Shoulder flexion A: 109 deg  162 A: 120 AA: 146  A: 143 deg   Shoulder abduction A: 128 deg     Shoulder internal rotation       Shoulder external rotation       Elbow flexion      Elbow extension      Wrist flexion      Wrist extension       (Blank rows = not tested) (Key: WFL = within functional limits not formally assessed, * = concordant pain, s = stiffness/stretching sensation, NT = not tested)  Comments: PROM testing limited by muscle guarding  UPPER EXTREMITY MMT:  MMT Right eval Left eval  Shoulder flexion    Shoulder extension    Shoulder abduction    Shoulder extension    Shoulder internal rotation    Shoulder external rotation    Elbow flexion    Elbow extension    Grip strength    (Blank rows = not tested)  (Key: WFL = within functional limits not formally assessed, * = concordant pain, s = stiffness/stretching sensation, NT = not tested)  Comments: deferred given proximity to  surgery and  pain w/ active mobility  PALPATION/OBSERVATION:  3 portal incisions all well appearing - no redness, swelling, drainage                                                                                                                             TREATMENT DATE:  [facility] Adult PT Treatment:                                                DATE: 07/14/23 Therapeutic Exercise: *** Manual Therapy: *** Neuromuscular re-ed: *** Therapeutic Activity: *** Modalities: *** Self Care: ***    [facility] Adult PT Treatment:                                                DATE: 07/12/23 Therapeutic Exercise: Supine flexion AAROM hands clasped 2x15 Prone scap retraction 2x8 cues for setup Supine chest press w/ dowel AAROM x10 Supine dowel serratus punch AAROM x8  Shoulder flex isometric submax x5 cues for setup, positioning Shoulder ER isometric submax x5 cues for setup and appropriate force output    [facility] Adult PT Treatment:                                                DATE: 07/06/23 Therapeutic Exercise: Reclined shoulder flexion w/ dowel 2x12 Reclined serratus punch 1# 2x8 cues for reduced elbow compensations Shoulder ER iso x6 w 3 sec hold cues for reduced compensations YTB row x10, red band row x8  Bicep curl 1# x12, 2# x8 HEP update + education/handout  Therapeutic Activity: Reclined shoulder flexion AROM x12 for reaching tolerance UE ranger flexion 2x8 for reaching tolerance, cues for setup and posture UE ranger abduction 2x8 for reaching tolerance    [facility] Adult PT Treatment:                                                DATE: 07/04/23 Therapeutic Exercise: Seated scap retraction x15 Yellow band row 2x10 cues for comfortable ROM/setup Reclined shoulder flex AAROM hands clasped 2x12 cues for pacing Reclined serratus punch 2x8, 1#x8 Sidelying ER AROM to neutral 2x12 cues for positioning and pacing  Swiss ball fwd flexion AAROM at table 2x10 cues for comfortable ROM  Swiss ball abduction  AAROM at table x10 Finger ladder x5 (up to 23) cues to mitigate shrug    PATIENT EDUCATION: Education details: rationale for interventions, HEP  Person educated:  Patient Education method: Explanation, Demonstration, Tactile cues, Verbal cues Education comprehension: verbalized understanding, returned demonstration, verbal cues required, tactile cues required, and needs further education     HOME EXERCISE PROGRAM: Access Code: [email or web address] URL: [email or web address] Date: 07/06/2023 Prepared by: Fransisco Hertz  Exercises - Seated Scapular Retraction  - 2-3 x daily - 1 sets - 6-8 reps - Supine Shoulder Press  - 2-3 x daily - 1 sets - 6-8 reps - Supine Shoulder Flexion Extension Full Range AROM  - 2-3 x daily - 1 sets - 10 reps - Standing Isometric Shoulder External Rotation with Doorway and Towel Roll  - 2-3 x daily - 1 sets - 6 reps  ASSESSMENT:  CLINICAL IMPRESSION: 07/13/2023 ***  *** Pt arrives w/ report of increased pain after last session that peaked over the weekend. Given this, today we make modifications for reduced demand w/ shoulder mobility training, modifications for periscapular work. She continues to endorse good tolerance overall within session but does note that she typically does not feel any increase in pain until afterwards. She does endorse some RUE numbness, anterior forearm into digits 3-5, that occurs after prone scap retraction. States this also occurred over the weekend, encouraged close monitoring. No adverse events, denies any increase in pain on departure. Recommend continuing along current POC in order to address relevant deficits and improve functional tolerance. Pt departs today's session in no acute distress, all voiced questions/concerns addressed appropriately from PT perspective.     EVAL: Patient is a pleasant 58 y.o. woman who was seen today for physical therapy evaluation and treatment for R shoulder SAD, DCE and cuff debridement DOS 06/02/23,  denies formal surgical restrictions. Endorses difficulty with ADLs and housework as expected post op, general soreness when using shoulder. On exam she demonstrates impaired GH strength/mobility as expected post op, PROM assessment limited primarily by muscle guarding. Requires cues/education for appropriate HEP performance, able to tolerate with mild soreness but no significant increases in pain. No adverse events. Recommend skilled PT to address aforementioned deficits with aim of improving tolerance to daily tasks and progress back to occupational tasks. Pt departs today's session in no acute distress, all voiced questions/concerns addressed appropriately from PT perspective.       OBJECTIVE IMPAIRMENTS: decreased activity tolerance, decreased endurance, decreased mobility, decreased ROM, decreased strength, impaired perceived functional ability, impaired UE functional use, and pain.   ACTIVITY LIMITATIONS: carrying, lifting, bathing, dressing, reach over head, and hygiene/grooming  PARTICIPATION LIMITATIONS: meal prep, cleaning, laundry, community activity, and occupation  PERSONAL FACTORS: Time since onset of injury/illness/exacerbation and 1-2 comorbidities: DM, HTN  are also affecting patient's functional outcome.   REHAB POTENTIAL: Good  CLINICAL DECISION MAKING: Stable/uncomplicated  EVALUATION COMPLEXITY: Low   GOALS:  SHORT TERM GOALS: Target date: 07/13/2023  Pt will demonstrate appropriate understanding and performance of initially prescribed HEP in order to facilitate improved independence with management of symptoms.  Baseline: HEP established  07/12/23: reports good HEP adherence Goal status: MET  2. Pt will report at least 25% improvement in overall pain levels over past week in order to facilitate improved tolerance to typical daily activities.   Baseline: 0-9/10 07/12/23: 0-7/10 Goal status: NEARLY MET   LONG TERM GOALS: Target date: 08/10/2023 Pt will score greater than  28 on PSFS in order to facilitate improved perception of function.   Baseline: 19   Goal status: INITIAL  2.  Pt will demonstrate at least 150 degrees of active R shoulder elevation in order to demonstrate improved tolerance  to functional movement patterns such as reaching overhead and into cabinets.  Baseline: see ROM chart above Goal status: INITIAL  3.  Pt will demonstrate at least 4+/5 shoulder flex/abd MMT for improved symmetry of UE strength and improved tolerance to functional movements.  Baseline: NT on eval Goal status: INITIAL  4. Pt will report at least 50% decrease in overall pain levels in past week in order to facilitate improved tolerance to basic ADLs/mobility.   Baseline: 0-9/10  Goal status: INITIAL     5. Pt will report ability to perform upper and lower body dressing with less than 3 pt increase in pain in order to facilitate improved tolerance to ADLs.  Baseline: inc pain with LBD/UBD  Goal status: INITIAL  6. Pt will be able to move 5# from waist to shoulder height for 5 repetitions in order to indicate improved functional shoulder endurance for work tasks.  Baseline: avoiding lifting tasks  Goal status: INITIAL   PLAN:  PT FREQUENCY: 1-2x/week  PT DURATION: 8 weeks  PLANNED INTERVENTIONS: 97164- PT Re-evaluation, 97110-Therapeutic exercises, 97530- Therapeutic activity, 97112- Neuromuscular re-education, 97535- Self Care, [id number]- Manual therapy, [id number]- Electrical stimulation (unattended), Patient/Family education, Balance training, Stair training, Taping, Dry Needling, Joint mobilization, Spinal mobilization, Scar mobilization, Cryotherapy, and Moist heat  PLAN FOR NEXT SESSION: Review/update HEP PRN. Work on [employer] exercises as appropriate with emphasis on shoulder PROM/AAROM initially, reducing muscle guarding. Periscapular activation and postural endurance within pt tolerance. Symptom modification strategies as indicated/appropriate.    Ashley Murrain PT, DPT 07/13/2023 12:03 PM

## 2023-07-14 ENCOUNTER — Encounter: Payer: Self-pay | Admitting: Physical Therapy

## 2023-07-14 ENCOUNTER — Ambulatory Visit: Payer: Self-pay | Admitting: Physical Therapy

## 2023-07-14 DIAGNOSIS — M25511 Pain in right shoulder: Secondary | ICD-10-CM | POA: Diagnosis not present

## 2023-07-14 DIAGNOSIS — M6281 Muscle weakness (generalized): Secondary | ICD-10-CM

## 2023-07-19 ENCOUNTER — Ambulatory Visit

## 2023-07-21 ENCOUNTER — Ambulatory Visit: Attending: Orthopedic Surgery

## 2023-07-21 DIAGNOSIS — M6281 Muscle weakness (generalized): Secondary | ICD-10-CM | POA: Diagnosis present

## 2023-07-21 DIAGNOSIS — M25511 Pain in right shoulder: Secondary | ICD-10-CM | POA: Diagnosis present

## 2023-07-21 NOTE — Therapy (Signed)
 OUTPATIENT PHYSICAL THERAPY TREATMENT NOTE   Patient Name: Mackenzie Clark MRN: [id number] DOB:[birth year], 58 y.o., female Today's Date: 07/21/2023  END OF SESSION:    PT End of Session - 07/21/23 1416     Visit Number 10    Number of Visits 17    Date for PT Re-Evaluation 08/10/23    Authorization Type workers comp    Authorization Time Period Approved 1 evaluation and 12 therapy visits    Authorization - Visit Number 9    Authorization - Number of Visits 12    PT Start Time 1402    PT Stop Time 1440    PT Time Calculation (min) 38 min                Past Medical History:  Diagnosis Date   Allergy    Arthritis    r shoulder   Diabetes mellitus without complication (HCC)    diet and exercise controlled   Hypertension    Past Surgical History:  Procedure Laterality Date   DILATION AND CURETTAGE OF UTERUS  04/05/2004   16 week stillbirth   IR ANGIOGRAM PELVIS SELECTIVE OR SUPRASELECTIVE  08/31/2019   IR ANGIOGRAM SELECTIVE EACH ADDITIONAL VESSEL  08/31/2019   IR EMBO TUMOR ORGAN ISCHEMIA INFARCT INC GUIDE ROADMAPPING  08/31/2019   IR FLUORO GUIDED NEEDLE PLC ASPIRATION/INJECTION LOC  08/31/2019   IR RADIOLOGIST EVAL & MGMT  07/10/2019   IR RADIOLOGIST EVAL & MGMT  09/18/2019   IR US  GUIDE VASC ACCESS RIGHT  08/31/2019   right shoulder arthroscopy Right 06/02/2023   TONSILLECTOMY     and adnoids   Patient Active Problem List   Diagnosis Date Noted   Status post shoulder surgery 07/12/2023   History of hematuria 03/01/2023   Encounter for general adult medical examination w/o abnormal findings 11/01/2022   Aortic atherosclerosis (HCC) 11/01/2022   Anxiety 11/01/2022   Vitamin D  deficiency disease 11/01/2022   Irregular menses 11/01/2022   Sprain of right ankle 11/01/2022   Pure hypercholesterolemia 05/06/2022   Overweight with body mass index (BMI) of 29 to 29.9 in adult 01/22/2021   Dyslipidemia associated with type 2 diabetes mellitus (HCC)  01/22/2021   Fibroids 08/31/2019   Abnormal uterine bleeding due to intramural leiomyoma 08/31/2019   Abnormal uterine bleeding (AUB) 05/21/2019   Uterine leiomyoma 05/21/2019   Body mass index (BMI) of 30.0-30.9 in adult 09/03/2017   Type II diabetes mellitus, uncontrolled 09/03/2017   Obesity 09/03/2017   Hypertensive heart disease 03/05/2013   Surveillance of previously prescribed contraceptive pill 03/05/2013    PCP: Cleave Curling, MD  REFERRING PROVIDER: Sammye Cristal, MD  REFERRING DIAG: S/P RIGHT SHOULDER SAD , DCE , CUFF DEBRIDEMENT  THERAPY DIAG:  Right shoulder pain, unspecified chronicity  Muscle weakness (generalized)  Rationale for Evaluation and Treatment: Rehabilitation  ONSET DATE: R shoulder cuff debridement, SAD, DCE DOS 06/02/23  Next MD appt: Next month per pt report  SUBJECTIVE:  SUBJECTIVE STATEMENT: 07/21/2023 Patient plans to return to work on 06/03/23. She feels nervous about how her shoulder respond to equipment adjustments throughout the day. She otherwise reports feeling confident with performance of exercises. She denies any recent numbness in her hand.    EVAL: Pt states she is having some shoulder soreness, states follow up with surgeon went well. Pt states she is out of work right now, denies formal restrictions. States she has limitations with ADLs as below, limited primarily by soreness. No N/T, no significant swelling, no redness, no fevers/chills, no drainage from portals.  Hand dominance: Left  PERTINENT HISTORY: HTN, DM2  PAIN:  Are you having pain: 4/10   Worst since last visit: 5/10   Per eval -  Location/description: R shoulder Best-worst over past week: 0-9/10  - aggravating factors: upper body dressing, tying shoes, washing back, doing hair,  sleeping - Easing factors: brace, ice pack     PRECAUTIONS: post op R shoulder ; per paper referral from physician okay for PROM/AAROM/AROM, strengthening   RED FLAGS: None   WEIGHT BEARING RESTRICTIONS: No  FALLS:  Has patient fallen in last 6 months? No  LIVING ENVIRONMENT: 2 story home, no issues with stairs, lives with son . 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  DIAGNOSTIC FINDINGS:  DOS 06/02/23 R shoulder SAD, DCE, cuff debridement  PATIENT SURVEYS:  Patient-specific activity scoring scheme (Point to one number):  "0" represents "unable to perform." "10" represents "able to perform at prior level. 0 1 2 3 4 5 6 7 8 9  10 (Date and Score) Activity Initial  Activity Eval     Fasten bra  5    Hair (wash)  0    Wash back 5   Tie shoes 5   Reach into cabinets 4    Total score = sum of the activity scores/number of activities Minimum detectable change (90%CI) for average score = 2 points Minimum detectable change (90%CI) for single activity score = 3 points Score: 19  COGNITION: Overall cognitive status: Within functional limits for tasks assessed     SENSATION: Denies sensory complaints  POSTURE: Mild guarding RUE  UPPER EXTREMITY ROM:  A/PROM Right eval Left eval Right 06/28/23 Right 07/06/23 Right 07/14/23  Shoulder flexion A: 109 deg  162 A: 120 AA: 146  A: 143 deg  A: 150 deg painful at beginning of arc  Shoulder abduction A: 128 deg    A: 152 painful at beginning of arc  Shoulder internal rotation        Shoulder external rotation        Elbow flexion       Elbow extension       Wrist flexion       Wrist extension        (Blank rows = not tested) (Key: WFL = within  functional limits not formally assessed, * = concordant pain, s = stiffness/stretching sensation, NT = not tested)  Comments: PROM testing limited by muscle guarding  UPPER EXTREMITY MMT:  MMT Right eval Left eval  Shoulder flexion    Shoulder extension    Shoulder abduction    Shoulder extension    Shoulder internal rotation    Shoulder external rotation  Elbow flexion    Elbow extension    Grip strength    (Blank rows = not tested)  (Key: WFL = within functional limits not formally assessed, * = concordant pain, s = stiffness/stretching sensation, NT = not tested)  Comments: deferred given proximity to surgery and pain w/ active mobility  PALPATION/OBSERVATION:  3 portal incisions all well appearing - no redness, swelling, drainage                                                                                                                             TREATMENT DATE:  [facility] Adult PT Treatment:                                                DATE: 07/14/23  Neuromuscular re-ed: Standing flex iso  x15 submax into ball against wall, 5 sec hold   Standing abd iso  x15 submax into ball against wall, 5 sec hold  Standing shoulder ext iso x15 submax into ball against wall, 5 sec hold  PNF flexion/extension with light resistance from clinician  Increased nerve pain to distally to ulnar side of hand  PNF D2 flexion/extension with light resistance from clinician  Increased nerve pain from ulnar to radial side of hand Ulnar nerve glide, no change with 1 set of 10  Seated shoulder shrugs, no change in symptoms   Therapeutic Activity: UE ranger AAROM for functional reaching, flex 2x8  UE ranger scaption 2x8  Wall Slides with pillowcase x 12     PATIENT EDUCATION: Education details: rationale for interventions, HEP  Person educated: Patient Education method: Explanation, Demonstration, Tactile cues, Verbal cues Education comprehension: verbalized understanding, returned  demonstration, verbal cues required, tactile cues required, and needs further education     HOME EXERCISE PROGRAM: Access Code: [email or web address] URL: [email or web address] Date: 07/14/2023 Prepared by: Mayme Spearman  Exercises - Seated Scapular Retraction  - 2-3 x daily - 1 sets - 6-8 reps - Supine Shoulder Press  - 2-3 x daily - 1 sets - 6-8 reps - Supine Shoulder Flexion Extension Full Range AROM  - 2-3 x daily - 1 sets - 10 reps - Isometric Shoulder Flexion at Wall  - 2-3 x daily - 1 sets - 8 reps - Isometric Shoulder Abduction at Wall  - 2-3 x daily - 1 sets - 8 reps  ASSESSMENT:  CLINICAL IMPRESSION: 07/21/2023 Pt has fair tolerance of today's treatment. She denies worsening of shoulder pain throughout session, but does note peripheralization of UE neural symptoms with resisted flexion/extension. Her neural symptoms are persisting at end of session. Patient plans to contineu with HEP at home. Plan to further assess structures/movements causing neural symptoms and address at next visit.    EVAL: Patient is a pleasant 58 y.o. woman who was seen today for physical therapy evaluation and  treatment for R shoulder SAD, DCE and cuff debridement DOS 06/02/23, denies formal surgical restrictions. Endorses difficulty with ADLs and housework as expected post op, general soreness when using shoulder. On exam she demonstrates impaired GH strength/mobility as expected post op, PROM assessment limited primarily by muscle guarding. Requires cues/education for appropriate HEP performance, able to tolerate with mild soreness but no significant increases in pain. No adverse events. Recommend skilled PT to address aforementioned deficits with aim of improving tolerance to daily tasks and progress back to occupational tasks. Pt departs today's session in no acute distress, all voiced questions/concerns addressed appropriately from PT perspective.       OBJECTIVE IMPAIRMENTS: decreased activity tolerance,  decreased endurance, decreased mobility, decreased ROM, decreased strength, impaired perceived functional ability, impaired UE functional use, and pain.   ACTIVITY LIMITATIONS: carrying, lifting, bathing, dressing, reach over head, and hygiene/grooming  PARTICIPATION LIMITATIONS: meal prep, cleaning, laundry, community activity, and occupation  PERSONAL FACTORS: Time since onset of injury/illness/exacerbation and 1-2 comorbidities: DM, HTN  are also affecting patient's functional outcome.   REHAB POTENTIAL: Good  CLINICAL DECISION MAKING: Stable/uncomplicated  EVALUATION COMPLEXITY: Low   GOALS:  SHORT TERM GOALS: Target date: 07/13/2023  Pt will demonstrate appropriate understanding and performance of initially prescribed HEP in order to facilitate improved independence with management of symptoms.  Baseline: HEP established  07/12/23: reports good HEP adherence Goal status: MET  2. Pt will report at least 25% improvement in overall pain levels over past week in order to facilitate improved tolerance to typical daily activities.   Baseline: 0-9/10 07/12/23: 0-7/10 Goal status: NEARLY MET   LONG TERM GOALS: Target date: 08/10/2023 Pt will score greater than 28 on PSFS in order to facilitate improved perception of function.   Baseline: 19   Goal status: INITIAL  2.  Pt will demonstrate at least 150 degrees of active R shoulder elevation in order to demonstrate improved tolerance to functional movement patterns such as reaching overhead and into cabinets.  Baseline: see ROM chart above Goal status: INITIAL  3.  Pt will demonstrate at least 4+/5 shoulder flex/abd MMT for improved symmetry of UE strength and improved tolerance to functional movements.  Baseline: NT on eval Goal status: INITIAL  4. Pt will report at least 50% decrease in overall pain levels in past week in order to facilitate improved tolerance to basic ADLs/mobility.   Baseline: 0-9/10  Goal status: INITIAL     5.  Pt will report ability to perform upper and lower body dressing with less than 3 pt increase in pain in order to facilitate improved tolerance to ADLs.  Baseline: inc pain with LBD/UBD  Goal status: INITIAL  6. Pt will be able to move 5# from waist to shoulder height for 5 repetitions in order to indicate improved functional shoulder endurance for work tasks.  Baseline: avoiding lifting tasks  Goal status: INITIAL   PLAN:  PT FREQUENCY: 1-2x/week  PT DURATION: 8 weeks  PLANNED INTERVENTIONS: 97164- PT Re-evaluation, 97110-Therapeutic exercises, 97530- Therapeutic activity, 97112- Neuromuscular re-education, 97535- Self Care, 16109- Manual therapy, [id number]- Electrical stimulation (unattended), Patient/Family education, Balance training, Stair training, Taping, Dry Needling, Joint mobilization, Spinal mobilization, Scar mobilization, Cryotherapy, and Moist heat  PLAN FOR NEXT SESSION: Review/update HEP PRN. Work on [employer] exercises as appropriate with emphasis on shoulder PROM/AAROM initially, reducing muscle guarding. Periscapular activation and postural endurance within pt tolerance. Symptom modification strategies as indicated/appropriate.    Arlester Bence, PT, DPT  07/22/2023 3:08 PM

## 2023-07-26 ENCOUNTER — Ambulatory Visit: Attending: Orthopedic Surgery

## 2023-07-26 DIAGNOSIS — M6281 Muscle weakness (generalized): Secondary | ICD-10-CM | POA: Diagnosis present

## 2023-07-26 DIAGNOSIS — M25511 Pain in right shoulder: Secondary | ICD-10-CM | POA: Insufficient documentation

## 2023-07-26 NOTE — Therapy (Signed)
 OUTPATIENT PHYSICAL THERAPY TREATMENT NOTE   Patient Name: Mackenzie Clark MRN: [id number] DOB:1965-11-21, 58 y.o., female Today's Date: 07/26/2023  END OF SESSION:  PT End of Session - 07/26/23 1405     Visit Number 11    Number of Visits 17    Date for PT Re-Evaluation 08/10/23    Authorization Type workers comp    Authorization Time Period Approved 1 evaluation and 12 therapy visits    Authorization - Visit Number 10    Authorization - Number of Visits 12    PT Start Time 1401    PT Stop Time 1440    PT Time Calculation (min) 39 min    Activity Tolerance Patient tolerated treatment well    Behavior During Therapy WFL for tasks assessed/performed              Past Medical History:  Diagnosis Date   Allergy    Arthritis    r shoulder   Diabetes mellitus without complication (HCC)    diet and exercise controlled   Hypertension    Past Surgical History:  Procedure Laterality Date   DILATION AND CURETTAGE OF UTERUS  04/05/2004   16 week stillbirth   IR ANGIOGRAM PELVIS SELECTIVE OR SUPRASELECTIVE  08/31/2019   IR ANGIOGRAM SELECTIVE EACH ADDITIONAL VESSEL  08/31/2019   IR EMBO TUMOR ORGAN ISCHEMIA INFARCT INC GUIDE ROADMAPPING  08/31/2019   IR FLUORO GUIDED NEEDLE PLC ASPIRATION/INJECTION LOC  08/31/2019   IR RADIOLOGIST EVAL & MGMT  07/10/2019   IR RADIOLOGIST EVAL & MGMT  09/18/2019   IR US  GUIDE VASC ACCESS RIGHT  08/31/2019   right shoulder arthroscopy Right 06/02/2023   TONSILLECTOMY     and adnoids   Patient Active Problem List   Diagnosis Date Noted   Status post shoulder surgery 07/12/2023   History of hematuria 03/01/2023   Encounter for general adult medical examination w/o abnormal findings 11/01/2022   Aortic atherosclerosis (HCC) 11/01/2022   Anxiety 11/01/2022   Vitamin D  deficiency disease 11/01/2022   Irregular menses 11/01/2022   Sprain of right ankle 11/01/2022   Pure hypercholesterolemia 05/06/2022   Overweight with body mass index  (BMI) of 29 to 29.9 in adult 01/22/2021   Dyslipidemia associated with type 2 diabetes mellitus (HCC) 01/22/2021   Fibroids 08/31/2019   Abnormal uterine bleeding due to intramural leiomyoma 08/31/2019   Abnormal uterine bleeding (AUB) 05/21/2019   Uterine leiomyoma 05/21/2019   Body mass index (BMI) of 30.0-30.9 in adult 09/03/2017   Type II diabetes mellitus, uncontrolled 09/03/2017   Obesity 09/03/2017   Hypertensive heart disease 03/05/2013   Surveillance of previously prescribed contraceptive pill 03/05/2013    PCP: Cleave Curling, MD  REFERRING PROVIDER: Sammye Cristal, MD  REFERRING DIAG: S/P RIGHT SHOULDER SAD , DCE , CUFF DEBRIDEMENT  THERAPY DIAG:  Muscle weakness (generalized)  Right shoulder pain, unspecified chronicity  Rationale for Evaluation and Treatment: Rehabilitation  ONSET DATE: R shoulder cuff debridement, SAD, DCE DOS 06/02/23  Next MD appt: Next month per pt report  SUBJECTIVE:  SUBJECTIVE STATEMENT: 07/26/2023 Patient reports that numbness subsided shortly after last visit. She plans to return to work on 06/03/23.   EVAL: Pt states she is having some shoulder soreness, states follow up with surgeon went well. Pt states she is out of work right now, denies formal restrictions. States she has limitations with ADLs as below, limited primarily by soreness. No N/T, no significant swelling, no redness, no fevers/chills, no drainage from portals.  Hand dominance: Left  PERTINENT HISTORY: HTN, DM2  PAIN:  Are you having pain: 4/10   Worst since last visit: 5/10   Per eval -  Location/description: R shoulder Best-worst over past week: 0-9/10  - aggravating factors: upper body dressing, tying shoes, washing back, doing hair, sleeping - Easing factors: brace, ice pack      PRECAUTIONS: post op R shoulder ; per paper referral from physician okay for PROM/AAROM/AROM, strengthening   RED FLAGS: None   WEIGHT BEARING RESTRICTIONS: No  FALLS:  Has patient fallen in last 6 months? No  LIVING ENVIRONMENT: 2 story home, no issues with stairs, lives with son . 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  DIAGNOSTIC FINDINGS:  DOS 06/02/23 R shoulder SAD, DCE, cuff debridement  PATIENT SURVEYS:  Patient-specific activity scoring scheme (Point to one number):  "0" represents "unable to perform." "10" represents "able to perform at prior level. 0 1 2 3 4 5 6 7 8 9  10 (Date and Score) Activity Initial  Activity Eval     Fasten bra  5    Hair (wash)  0    Wash back 5   Tie shoes 5   Reach into cabinets 4    Total score = sum of the activity scores/number of activities Minimum detectable change (90%CI) for average score = 2 points Minimum detectable change (90%CI) for single activity score = 3 points Score: 19  COGNITION: Overall cognitive status: Within functional limits for tasks assessed     SENSATION: Denies sensory complaints  POSTURE: Mild guarding RUE  UPPER EXTREMITY ROM:  A/PROM Right eval Left eval Right 06/28/23 Right 07/06/23 Right 07/14/23  Shoulder flexion A: 109 deg  162 A: 120 AA: 146  A: 143 deg  A: 150 deg painful at beginning of arc  Shoulder abduction A: 128 deg    A: 152 painful at beginning of arc  Shoulder internal rotation        Shoulder external rotation        Elbow flexion       Elbow extension       Wrist flexion       Wrist extension        (Blank rows = not tested) (Key: WFL = within functional limits not formally assessed, * =  concordant pain, s = stiffness/stretching sensation, NT = not tested)  Comments: PROM testing limited by muscle guarding  UPPER EXTREMITY MMT:  MMT Right eval Left eval  Shoulder flexion    Shoulder extension    Shoulder abduction    Shoulder extension    Shoulder internal rotation    Shoulder external rotation    Elbow flexion    Elbow extension    Grip strength    (Blank rows = not tested)  (  Key: WFL = within functional limits not formally assessed, * = concordant pain, s = stiffness/stretching sensation, NT = not tested)  Comments: deferred given proximity to surgery and pain w/ active mobility  PALPATION/OBSERVATION:  3 portal incisions all well appearing - no redness, swelling, drainage                                                                                                                             TREATMENT DATE:    [facility] Adult PT Treatment:                                                DATE: 07/26/2023   Neuromuscular re-ed: Supine serratus punches, 2 x 10  Seated shoulder shrugs, 2 x 10  Standing flex iso  x15 submax into ball against wall, 5 sec hold   Standing abd iso  x15 submax into ball against wall, 5 sec hold  Standing shoulder ext iso x15 submax into ball against wall, 5 sec hold  D1 extension with YTB, 2 x 10   Therapeutic Activity: UE ranger AAROM for functional reaching, flex 2x10 UE ranger scaption 2x10 Wall Slides with pillowcase 2 x 10     PATIENT EDUCATION: Education details: rationale for interventions, HEP  Person educated: Patient Education method: Explanation, Demonstration, Tactile cues, Verbal cues Education comprehension: verbalized understanding, returned demonstration, verbal cues required, tactile cues required, and needs further education     HOME EXERCISE PROGRAM: Access Code: [email or web address] URL: [email or web address] Date: 07/14/2023 Prepared by: Mayme Spearman  Exercises - Seated Scapular Retraction  -  2-3 x daily - 1 sets - 6-8 reps - Supine Shoulder Press  - 2-3 x daily - 1 sets - 6-8 reps - Supine Shoulder Flexion Extension Full Range AROM  - 2-3 x daily - 1 sets - 10 reps - Isometric Shoulder Flexion at Wall  - 2-3 x daily - 1 sets - 8 reps - Isometric Shoulder Abduction at Wall  - 2-3 x daily - 1 sets - 8 reps  ASSESSMENT:  CLINICAL IMPRESSION: 07/26/2023 Leighanna had good tolerance of today's treatment session, which focused on shoulder strengthening. Patient had some difficulty with isometric and concentric strengthening activities, with exacerbation of radicular symptoms. We will continue to progress per POC as tolerated, in order to reach established rehab goals.    EVAL: Patient is a pleasant 58 y.o. woman who was seen today for physical therapy evaluation and treatment for R shoulder SAD, DCE and cuff debridement DOS 06/02/23, denies formal surgical restrictions. Endorses difficulty with ADLs and housework as expected post op, general soreness when using shoulder. On exam she demonstrates impaired GH strength/mobility as expected post op, PROM assessment limited primarily by muscle guarding. Requires cues/education for appropriate HEP performance, able to tolerate with mild soreness but no significant increases in pain.  No adverse events. Recommend skilled PT to address aforementioned deficits with aim of improving tolerance to daily tasks and progress back to occupational tasks. Pt departs today's session in no acute distress, all voiced questions/concerns addressed appropriately from PT perspective.       OBJECTIVE IMPAIRMENTS: decreased activity tolerance, decreased endurance, decreased mobility, decreased ROM, decreased strength, impaired perceived functional ability, impaired UE functional use, and pain.   ACTIVITY LIMITATIONS: carrying, lifting, bathing, dressing, reach over head, and hygiene/grooming  PARTICIPATION LIMITATIONS: meal prep, cleaning, laundry, community activity, and  occupation  PERSONAL FACTORS: Time since onset of injury/illness/exacerbation and 1-2 comorbidities: DM, HTN  are also affecting patient's functional outcome.   REHAB POTENTIAL: Good  CLINICAL DECISION MAKING: Stable/uncomplicated  EVALUATION COMPLEXITY: Low   GOALS:  SHORT TERM GOALS: Target date: 07/13/2023  Pt will demonstrate appropriate understanding and performance of initially prescribed HEP in order to facilitate improved independence with management of symptoms.  Baseline: HEP established  07/12/23: reports good HEP adherence Goal status: MET  2. Pt will report at least 25% improvement in overall pain levels over past week in order to facilitate improved tolerance to typical daily activities.   Baseline: 0-9/10 07/12/23: 0-7/10 Goal status: NEARLY MET   LONG TERM GOALS: Target date: 08/10/2023 Pt will score greater than 28 on PSFS in order to facilitate improved perception of function.   Baseline: 19   Goal status: INITIAL  2.  Pt will demonstrate at least 150 degrees of active R shoulder elevation in order to demonstrate improved tolerance to functional movement patterns such as reaching overhead and into cabinets.  Baseline: see ROM chart above Goal status: INITIAL  3.  Pt will demonstrate at least 4+/5 shoulder flex/abd MMT for improved symmetry of UE strength and improved tolerance to functional movements.  Baseline: NT on eval Goal status: INITIAL  4. Pt will report at least 50% decrease in overall pain levels in past week in order to facilitate improved tolerance to basic ADLs/mobility.   Baseline: 0-9/10  Goal status: INITIAL     5. Pt will report ability to perform upper and lower body dressing with less than 3 pt increase in pain in order to facilitate improved tolerance to ADLs.  Baseline: inc pain with LBD/UBD  Goal status: INITIAL  6. Pt will be able to move 5# from waist to shoulder height for 5 repetitions in order to indicate improved functional shoulder  endurance for work tasks.  Baseline: avoiding lifting tasks  Goal status: INITIAL   PLAN:  PT FREQUENCY: 1-2x/week  PT DURATION: 8 weeks  PLANNED INTERVENTIONS: 97164- PT Re-evaluation, 97110-Therapeutic exercises, 97530- Therapeutic activity, 97112- Neuromuscular re-education, 97535- Self Care, [id number]- Manual therapy, [id number]- Electrical stimulation (unattended), Patient/Family education, Balance training, Stair training, Taping, Dry Needling, Joint mobilization, Spinal mobilization, Scar mobilization, Cryotherapy, and Moist heat  PLAN FOR NEXT SESSION: Review/update HEP PRN. Work on [employer] exercises as appropriate with emphasis on shoulder PROM/AAROM initially, reducing muscle guarding. Periscapular activation and postural endurance within pt tolerance. Symptom modification strategies as indicated/appropriate.    Arlester Bence, PT, DPT  07/26/2023 3:24 PM

## 2023-07-28 ENCOUNTER — Encounter: Payer: Self-pay | Admitting: Physical Therapy

## 2023-07-28 ENCOUNTER — Ambulatory Visit: Payer: Self-pay | Attending: Orthopedic Surgery | Admitting: Physical Therapy

## 2023-07-28 DIAGNOSIS — M6281 Muscle weakness (generalized): Secondary | ICD-10-CM | POA: Insufficient documentation

## 2023-07-28 DIAGNOSIS — M25511 Pain in right shoulder: Secondary | ICD-10-CM | POA: Diagnosis present

## 2023-07-28 NOTE — Therapy (Signed)
 OUTPATIENT PHYSICAL THERAPY TREATMENT NOTE   Patient Name: Kaysia Willard MRN: [id number] DOB:09/10/1965, 58 y.o., female Today's Date: 07/28/2023  END OF SESSION:  PT End of Session - 07/28/23 1402     Visit Number 12    Number of Visits 17    Date for PT Re-Evaluation 08/10/23    Authorization Type workers comp    Authorization - Visit Number 11    Authorization - Number of Visits 12    PT Start Time 1402    PT Stop Time 1440    PT Time Calculation (min) 38 min    Activity Tolerance Patient tolerated treatment well               Past Medical History:  Diagnosis Date   Allergy    Arthritis    r shoulder   Diabetes mellitus without complication (HCC)    diet and exercise controlled   Hypertension    Past Surgical History:  Procedure Laterality Date   DILATION AND CURETTAGE OF UTERUS  04/05/2004   16 week stillbirth   IR ANGIOGRAM PELVIS SELECTIVE OR SUPRASELECTIVE  08/31/2019   IR ANGIOGRAM SELECTIVE EACH ADDITIONAL VESSEL  08/31/2019   IR EMBO TUMOR ORGAN ISCHEMIA INFARCT INC GUIDE ROADMAPPING  08/31/2019   IR FLUORO GUIDED NEEDLE PLC ASPIRATION/INJECTION LOC  08/31/2019   IR RADIOLOGIST EVAL & MGMT  07/10/2019   IR RADIOLOGIST EVAL & MGMT  09/18/2019   IR US  GUIDE VASC ACCESS RIGHT  08/31/2019   right shoulder arthroscopy Right 06/02/2023   TONSILLECTOMY     and adnoids   Patient Active Problem List   Diagnosis Date Noted   Status post shoulder surgery 07/12/2023   History of hematuria 03/01/2023   Encounter for general adult medical examination w/o abnormal findings 11/01/2022   Aortic atherosclerosis (HCC) 11/01/2022   Anxiety 11/01/2022   Vitamin D  deficiency disease 11/01/2022   Irregular menses 11/01/2022   Sprain of right ankle 11/01/2022   Pure hypercholesterolemia 05/06/2022   Overweight with body mass index (BMI) of 29 to 29.9 in adult 01/22/2021   Dyslipidemia associated with type 2 diabetes mellitus (HCC) 01/22/2021   Fibroids  08/31/2019   Abnormal uterine bleeding due to intramural leiomyoma 08/31/2019   Abnormal uterine bleeding (AUB) 05/21/2019   Uterine leiomyoma 05/21/2019   Body mass index (BMI) of 30.0-30.9 in adult 09/03/2017   Type II diabetes mellitus, uncontrolled 09/03/2017   Obesity 09/03/2017   Hypertensive heart disease 03/05/2013   Surveillance of previously prescribed contraceptive pill 03/05/2013    PCP: Cleave Curling, MD  REFERRING PROVIDER: Sammye Cristal, MD  REFERRING DIAG: S/P RIGHT SHOULDER SAD , DCE , CUFF DEBRIDEMENT  THERAPY DIAG:  Right shoulder pain, unspecified chronicity  Muscle weakness (generalized)  Rationale for Evaluation and Treatment: Rehabilitation  ONSET DATE: R shoulder cuff debridement, SAD, DCE DOS 06/02/23  Next MD appt: Next month per pt report  SUBJECTIVE:  SUBJECTIVE STATEMENT: 07/28/2023 posterior/lateral soreness after last session that is still pretty persistent today. Had some transient numbness that resolved after about an hour. 4/10 posterolateral shoulder at present. No other new updates   EVAL: Pt states she is having some shoulder soreness, states follow up with surgeon went well. Pt states she is out of work right now, denies formal restrictions. States she has limitations with ADLs as below, limited primarily by soreness. No N/T, no significant swelling, no redness, no fevers/chills, no drainage from portals.  Hand dominance: Left  PERTINENT HISTORY: HTN, DM2  PAIN:  Are you having pain: 4/10   Worst since last visit: 5/10   Per eval -  Location/description: R shoulder Best-worst over past week: 0-9/10  - aggravating factors: upper body dressing, tying shoes, washing back, doing hair, sleeping - Easing factors: brace, ice pack     PRECAUTIONS: post op R  shoulder ; per paper referral from physician okay for PROM/AAROM/AROM, strengthening   RED FLAGS: None   WEIGHT BEARING RESTRICTIONS: No  FALLS:  Has patient fallen in last 6 months? No  LIVING ENVIRONMENT: 2 story home, no issues with stairs, lives with son . 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  DIAGNOSTIC FINDINGS:  DOS 06/02/23 R shoulder SAD, DCE, cuff debridement  PATIENT SURVEYS:  Patient-specific activity scoring scheme (Point to one number):  "0" represents "unable to perform." "10" represents "able to perform at prior level. 0 1 2 3 4 5 6 7 8 9  10 (Date and Score) Activity Initial  Activity Eval     Fasten bra  5    Hair (wash)  0    Wash back 5   Tie shoes 5   Reach into cabinets 4    Total score = sum of the activity scores/number of activities Minimum detectable change (90%CI) for average score = 2 points Minimum detectable change (90%CI) for single activity score = 3 points Score: 19  COGNITION: Overall cognitive status: Within functional limits for tasks assessed     SENSATION: Denies sensory complaints  POSTURE: Mild guarding RUE  UPPER EXTREMITY ROM:  A/PROM Right eval Left eval Right 06/28/23 Right 07/06/23 Right 07/14/23  Shoulder flexion A: 109 deg  162 A: 120 AA: 146  A: 143 deg  A: 150 deg painful at beginning of arc  Shoulder abduction A: 128 deg    A: 152 painful at beginning of arc  Shoulder internal rotation        Shoulder external rotation        Elbow flexion       Elbow extension       Wrist flexion       Wrist extension        (Blank rows = not tested) (Key: WFL = within functional limits not formally assessed, * = concordant pain, s =  stiffness/stretching sensation, NT = not tested)  Comments: PROM testing limited by muscle guarding  UPPER EXTREMITY MMT:  MMT Right eval Left eval  Shoulder flexion    Shoulder extension    Shoulder abduction    Shoulder extension    Shoulder internal rotation    Shoulder external rotation    Elbow flexion    Elbow extension  Grip strength    (Blank rows = not tested)  (Key: WFL = within functional limits not formally assessed, * = concordant pain, s = stiffness/stretching sensation, NT = not tested)  Comments: deferred given proximity to surgery and pain w/ active mobility  PALPATION/OBSERVATION:  3 portal incisions all well appearing - no redness, swelling, drainage                                                                                                                             TREATMENT DATE:   [facility] Adult PT Treatment:                                                DATE: 07/28/23 Therapeutic Exercise: Propped supine double ER x10 unresisted, x10 w/ red band  Supine horizontal abd unresisted, short lever x10 Supine red band horizontal abd x8 Manually resisted scapular retraction x5 (mild provocation of numbness) Shoulder shrugs x12 emphasis on scapular depression  Neuromuscular re-ed: Ulnar nerve test + glide prescription (2x8) ; extensive time with education, monitoring, rationale for interventions, relevant anatomy/physiology   Therapeutic Activity: UE ranger scaption 2x10 cues for setup and comfortable ROM UE ranger lateral reach 2x10 cues for setup and comfortable ROM     [facility] Adult PT Treatment:                                                DATE: 07/28/2023   Neuromuscular re-ed: Supine serratus punches, 2 x 10  Seated shoulder shrugs, 2 x 10  Standing flex iso  x15 submax into ball against wall, 5 sec hold   Standing abd iso  x15 submax into ball against wall, 5 sec hold  Standing shoulder ext iso x15 submax into ball against wall, 5 sec hold   D1 extension with YTB, 2 x 10   Therapeutic Activity: UE ranger AAROM for functional reaching, flex 2x10 UE ranger scaption 2x10 Wall Slides with pillowcase 2 x 10     PATIENT EDUCATION: Education details: rationale for interventions, HEP  Person educated: Patient Education method: Explanation, Demonstration, Tactile cues, Verbal cues Education comprehension: verbalized understanding, returned demonstration, verbal cues required, tactile cues required, and needs further education     HOME EXERCISE PROGRAM: Access Code: [email or web address] URL: [email or web address] Date: 07/14/2023 Prepared by: Mayme Spearman  Exercises - Seated Scapular Retraction  - 2-3 x daily - 1 sets - 6-8 reps - Supine Shoulder Press  - 2-3 x daily - 1 sets - 6-8 reps - Supine Shoulder Flexion Extension Full Range AROM  - 2-3 x daily - 1 sets - 10 reps - Isometric Shoulder Flexion at Wall  - 2-3 x daily - 1 sets -  8 reps - Isometric Shoulder Abduction at Wall  - 2-3 x daily - 1 sets - 8 reps  ASSESSMENT:  CLINICAL IMPRESSION: 07/28/2023 Pt arrives w/ report of 4/10 pain, transient numbness after last session. In discussion w/ pt sounds like majority of provocative movements involve compression of subacromial space - given this, today we focus on strength/mobility in positions that allow for less compression. She tolerates this quite well and is actually able to progress resistance for shoulder strengthening without increase in pain or provocation of numbness, although with trial of scapular retraction she does get mild numbness in last three digits. She has positive ulnar nerve test, trial of ulnar nerve glides transiently improves symptoms but returns after a few moments. No adverse events, pt endorses mild numbness but improved pain on departure. Recommend continuing along current POC in order to address relevant deficits and improve functional tolerance. Pt departs today's session in no acute distress, all  voiced questions/concerns addressed appropriately from PT perspective.      EVAL: Patient is a pleasant 58 y.o. woman who was seen today for physical therapy evaluation and treatment for R shoulder SAD, DCE and cuff debridement DOS 06/02/23, denies formal surgical restrictions. Endorses difficulty with ADLs and housework as expected post op, general soreness when using shoulder. On exam she demonstrates impaired GH strength/mobility as expected post op, PROM assessment limited primarily by muscle guarding. Requires cues/education for appropriate HEP performance, able to tolerate with mild soreness but no significant increases in pain. No adverse events. Recommend skilled PT to address aforementioned deficits with aim of improving tolerance to daily tasks and progress back to occupational tasks. Pt departs today's session in no acute distress, all voiced questions/concerns addressed appropriately from PT perspective.       OBJECTIVE IMPAIRMENTS: decreased activity tolerance, decreased endurance, decreased mobility, decreased ROM, decreased strength, impaired perceived functional ability, impaired UE functional use, and pain.   ACTIVITY LIMITATIONS: carrying, lifting, bathing, dressing, reach over head, and hygiene/grooming  PARTICIPATION LIMITATIONS: meal prep, cleaning, laundry, community activity, and occupation  PERSONAL FACTORS: Time since onset of injury/illness/exacerbation and 1-2 comorbidities: DM, HTN  are also affecting patient's functional outcome.   REHAB POTENTIAL: Good  CLINICAL DECISION MAKING: Stable/uncomplicated  EVALUATION COMPLEXITY: Low   GOALS:  SHORT TERM GOALS: Target date: 07/13/2023  Pt will demonstrate appropriate understanding and performance of initially prescribed HEP in order to facilitate improved independence with management of symptoms.  Baseline: HEP established  07/12/23: reports good HEP adherence Goal status: MET  2. Pt will report at least 25%  improvement in overall pain levels over past week in order to facilitate improved tolerance to typical daily activities.   Baseline: 0-9/10 07/12/23: 0-7/10 Goal status: NEARLY MET   LONG TERM GOALS: Target date: 08/10/2023 Pt will score greater than 28 on PSFS in order to facilitate improved perception of function.   Baseline: 19   Goal status: INITIAL  2.  Pt will demonstrate at least 150 degrees of active R shoulder elevation in order to demonstrate improved tolerance to functional movement patterns such as reaching overhead and into cabinets.  Baseline: see ROM chart above Goal status: INITIAL  3.  Pt will demonstrate at least 4+/5 shoulder flex/abd MMT for improved symmetry of UE strength and improved tolerance to functional movements.  Baseline: NT on eval Goal status: INITIAL  4. Pt will report at least 50% decrease in overall pain levels in past week in order to facilitate improved tolerance to basic ADLs/mobility.   Baseline:  0-9/10  Goal status: INITIAL     5. Pt will report ability to perform upper and lower body dressing with less than 3 pt increase in pain in order to facilitate improved tolerance to ADLs.  Baseline: inc pain with LBD/UBD  Goal status: INITIAL  6. Pt will be able to move 5# from waist to shoulder height for 5 repetitions in order to indicate improved functional shoulder endurance for work tasks.  Baseline: avoiding lifting tasks  Goal status: INITIAL   PLAN:  PT FREQUENCY: 1-2x/week  PT DURATION: 8 weeks  PLANNED INTERVENTIONS: 97164- PT Re-evaluation, 97110-Therapeutic exercises, 97530- Therapeutic activity, 97112- Neuromuscular re-education, 97535- Self Care, [id number]- Manual therapy, [id number]- Electrical stimulation (unattended), Patient/Family education, Balance training, Stair training, Taping, Dry Needling, Joint mobilization, Spinal mobilization, Scar mobilization, Cryotherapy, and Moist heat  PLAN FOR NEXT SESSION: Review/update HEP PRN. Work on  [employer] exercises as appropriate with emphasis on shoulder PROM/AAROM initially, reducing muscle guarding. Periscapular activation and postural endurance within pt tolerance. Symptom modification strategies as indicated/appropriate.    Lovett Ruck PT, DPT 07/28/2023 2:42 PM

## 2023-08-02 ENCOUNTER — Ambulatory Visit

## 2023-08-02 DIAGNOSIS — M25511 Pain in right shoulder: Secondary | ICD-10-CM

## 2023-08-02 DIAGNOSIS — M6281 Muscle weakness (generalized): Secondary | ICD-10-CM

## 2023-08-02 NOTE — Therapy (Addendum)
 OUTPATIENT PHYSICAL THERAPY TREATMENT NOTE/RECERT   Patient Name: Mackenzie Clark MRN: [id number] DOB:[birth year], 58 y.o., female Today's Date: 08/02/2023  END OF SESSION:  PT End of Session - 08/02/23 1414     Visit Number 13    Number of Visits 17    Date for PT Re-Evaluation 08/10/23    Authorization Type workers comp    Authorization Time Period Approved 1 evaluation and 12 therapy visits    Authorization - Visit Number 12    Authorization - Number of Visits 12    PT Start Time 1405    PT Stop Time 1444    PT Time Calculation (min) 39 min    Activity Tolerance Patient tolerated treatment well    Behavior During Therapy WFL for tasks assessed/performed                Past Medical History:  Diagnosis Date   Allergy    Arthritis    r shoulder   Diabetes mellitus without complication (HCC)    diet and exercise controlled   Hypertension    Past Surgical History:  Procedure Laterality Date   DILATION AND CURETTAGE OF UTERUS  04/05/2004   16 week stillbirth   IR ANGIOGRAM PELVIS SELECTIVE OR SUPRASELECTIVE  08/31/2019   IR ANGIOGRAM SELECTIVE EACH ADDITIONAL VESSEL  08/31/2019   IR EMBO TUMOR ORGAN ISCHEMIA INFARCT INC GUIDE ROADMAPPING  08/31/2019   IR FLUORO GUIDED NEEDLE PLC ASPIRATION/INJECTION LOC  08/31/2019   IR RADIOLOGIST EVAL & MGMT  07/10/2019   IR RADIOLOGIST EVAL & MGMT  09/18/2019   IR US  GUIDE VASC ACCESS RIGHT  08/31/2019   right shoulder arthroscopy Right 06/02/2023   TONSILLECTOMY     and adnoids   Patient Active Problem List   Diagnosis Date Noted   Status post shoulder surgery 07/12/2023   History of hematuria 03/01/2023   Encounter for general adult medical examination w/o abnormal findings 11/01/2022   Aortic atherosclerosis (HCC) 11/01/2022   Anxiety 11/01/2022   Vitamin D  deficiency disease 11/01/2022   Irregular menses 11/01/2022   Sprain of right ankle 11/01/2022   Pure hypercholesterolemia 05/06/2022   Overweight with body  mass index (BMI) of 29 to 29.9 in adult 01/22/2021   Dyslipidemia associated with type 2 diabetes mellitus (HCC) 01/22/2021   Fibroids 08/31/2019   Abnormal uterine bleeding due to intramural leiomyoma 08/31/2019   Abnormal uterine bleeding (AUB) 05/21/2019   Uterine leiomyoma 05/21/2019   Body mass index (BMI) of 30.0-30.9 in adult 09/03/2017   Type II diabetes mellitus, uncontrolled 09/03/2017   Obesity 09/03/2017   Hypertensive heart disease 03/05/2013   Surveillance of previously prescribed contraceptive pill 03/05/2013    PCP: Cleave Curling, MD  REFERRING PROVIDER: Sammye Cristal, MD  REFERRING DIAG: S/P RIGHT SHOULDER SAD , DCE , CUFF DEBRIDEMENT  THERAPY DIAG:  Right shoulder pain, unspecified chronicity  Muscle weakness (generalized)  Acute pain of right shoulder  Rationale for Evaluation and Treatment: Rehabilitation  ONSET DATE: R shoulder cuff debridement, SAD, DCE DOS 06/02/23  Next MD appt: Next month per pt report  SUBJECTIVE:  SUBJECTIVE STATEMENT: 08/02/2023 Patient reports that she feels she is making improvement with PT. She has less pain overall, but continues to have R UE radicular symptoms, especially in ulnar side of hand. She did return to work yesterday, and feels that it went okay, as she only had a couple of patients. However, she had some pain after adjusting the machine, and feels low confidence with returning to a full day of patients.   EVAL: Pt states she is having some shoulder soreness, states follow up with surgeon went well. Pt states she is out of work right now, denies formal restrictions. States she has limitations with ADLs as below, limited primarily by soreness. No N/T, no significant swelling, no redness, no fevers/chills, no drainage from portals.  Hand  dominance: Left  PERTINENT HISTORY: HTN, DM2  PAIN:  Are you having pain: 4/10   Worst since last visit: 5/10   Per eval -  Location/description: R shoulder Best-worst over past week: 0-9/10  - aggravating factors: upper body dressing, tying shoes, washing back, doing hair, sleeping - Easing factors: brace, ice pack     PRECAUTIONS: post op R shoulder ; per paper referral from physician okay for PROM/AAROM/AROM, strengthening   RED FLAGS: None   WEIGHT BEARING RESTRICTIONS: No  FALLS:  Has patient fallen in last 6 months? No  LIVING ENVIRONMENT: 2 story home, no issues with stairs, lives with son . 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  DIAGNOSTIC FINDINGS:  DOS 06/02/23 R shoulder SAD, DCE, cuff debridement  PATIENT SURVEYS:  Patient-specific activity scoring scheme (Point to one number):  "0" represents "unable to perform." "10" represents "able to perform at prior level. 0 1 2 3 4 5 6 7 8 9  10 (Date and Score) Activity Initial  Activity Eval   08/02/23  Fasten bra  5 8   Hair (wash)  0 8  Wash back 5 8  Tie shoes 5 9  Reach into cabinets 4 7  Total 19 40  Avg  3.8 8   Total score = sum of the activity scores/number of activities Minimum detectable change (90%CI) for average score = 2 points Minimum detectable change (90%CI) for single activity score = 3 points Score: 19  COGNITION: Overall cognitive status: Within functional limits for tasks assessed     SENSATION: Denies sensory complaints  POSTURE: Mild guarding RUE  UPPER EXTREMITY ROM:  A/PROM Right eval Left eval Right 06/28/23 Right 07/06/23 Right 07/14/23 Right 08/02/23  Shoulder flexion A: 109 deg  162 A: 120 AA:  146  A: 143 deg  A: 150 deg painful at beginning of arc A: 145 deg   Shoulder abduction A: 128 deg    A: 152 painful at beginning of arc A: 130 deg  Shoulder internal rotation         Shoulder external rotation         Elbow flexion        Elbow extension        Wrist flexion        Wrist extension         (Blank rows = not tested) (Key: WFL = within functional limits not formally assessed, * = concordant pain, s = stiffness/stretching  sensation, NT = not tested)  Comments: PROM testing limited by muscle guarding  UPPER EXTREMITY MMT:  MMT Right 08/02/23 Left 08/02/23  Shoulder flexion 4 5  Shoulder extension    Shoulder abduction 4 4+  Shoulder extension    Shoulder internal rotation 4 5  Shoulder external rotation 4+ 5  Elbow flexion    Elbow extension    Grip strength 40# 65#  (Blank rows = not tested)  (Key: WFL = within functional limits not formally assessed, * = concordant pain, s = stiffness/stretching sensation, NT = not tested)  Comments: deferred given proximity to surgery and pain w/ active mobility  PALPATION/OBSERVATION:  3 portal incisions all well appearing - no redness, swelling, drainage                                                                                                                             TREATMENT DATE:    [facility] Adult PT Treatment:                                                DATE: 08/02/2023  Therapeutic Exercise: Propped supine double ER 2x8 BIL RTB Supine red band horizontal abd 2x8 Shoulder shrugs x12 emphasis on scapular depression UE ranger scaption 2x10 cues for setup and comfortable ROM UE ranger lateral reach 2x10 cues for setup and comfortable ROM  Neuromuscular re-ed: Ulnar nerve test + glide 2x8  Therapeutic Activity:  Reassessment of objective measures and subjective assessment regarding progress towards established goals and plan for extended POC to further address work-related rehab goals.     [facility] Adult PT  Treatment:                                                DATE: 07/28/2023 Therapeutic Exercise: Propped supine double ER x10 unresisted, x10 w/ red band  Supine horizontal abd unresisted, short lever x10 Supine red band horizontal abd x8 Manually resisted scapular retraction x5 (mild provocation of numbness) Shoulder shrugs x12 emphasis on scapular depression  Neuromuscular re-ed: Ulnar nerve test + glide prescription (2x8) ; extensive time with education, monitoring, rationale for interventions, relevant anatomy/physiology   Therapeutic Activity: UE ranger scaption 2x10 cues for setup and comfortable ROM UE ranger lateral reach 2x10 cues for setup and comfortable ROM       PATIENT EDUCATION: Education details: rationale for interventions, HEP  Person educated: Patient Education method: Explanation, Demonstration, Tactile cues, Verbal cues Education comprehension: verbalized understanding, returned demonstration, verbal cues required, tactile cues required, and needs further education     HOME EXERCISE PROGRAM: Access Code: [email or web address] URL: [email or web address] Date: 07/14/2023 Prepared by: Mayme Spearman  Exercises - Seated Scapular Retraction  -  2-3 x daily - 1 sets - 6-8 reps - Supine Shoulder Press  - 2-3 x daily - 1 sets - 6-8 reps - Supine Shoulder Flexion Extension Full Range AROM  - 2-3 x daily - 1 sets - 10 reps - Isometric Shoulder Flexion at Wall  - 2-3 x daily - 1 sets - 8 reps - Isometric Shoulder Abduction at Wall  - 2-3 x daily - 1 sets - 8 reps  ASSESSMENT:  CLINICAL IMPRESSION: 08/02/2023 Jennea has attended 12 treatment sessions following evaluation, d/t R shoulder SAD, DCE, and cuff debridement on 06/02/2023. At this time, patient is 8 weeks post op and has ongoing radicular symptoms. She has remaining R shoulder ROM, strength and grip strength deficits. She does have improved tolerance of ADLs/IADLs and at least 50% of work-related activities. She  requires ongoing skilled PT intervention at this time, with frequency of 2x/week, for at least 4 more weeks in order to improve tolerance of occupational duties. Plan is to progress functional UQ strength, mobility, including high repetitions to improve muscular endurance.     OBJECTIVE IMPAIRMENTS: decreased activity tolerance, decreased endurance, decreased mobility, decreased ROM, decreased strength, impaired perceived functional ability, impaired UE functional use, and pain.   ACTIVITY LIMITATIONS: carrying, lifting, bathing, dressing, reach over head, and hygiene/grooming  PARTICIPATION LIMITATIONS: meal prep, cleaning, laundry, community activity, and occupation  PERSONAL FACTORS: Time since onset of injury/illness/exacerbation and 1-2 comorbidities: DM, HTN  are also affecting patient's functional outcome.   REHAB POTENTIAL: Good  CLINICAL DECISION MAKING: Stable/uncomplicated  EVALUATION COMPLEXITY: Low   GOALS:  SHORT TERM GOALS: Target date: 07/13/2023  Pt will demonstrate appropriate understanding and performance of initially prescribed HEP in order to facilitate improved independence with management of symptoms.  Baseline: HEP established  07/12/23: reports good HEP adherence Goal status: MET  2. Pt will report at least 25% improvement in overall pain levels over past week in order to facilitate improved tolerance to typical daily activities.   Baseline: 0-9/10 07/12/23: 0-7/10 08/02/23: 0-6/10 Goal status: NEARLY MET   LONG TERM GOALS: Target date: 08/30/2023, last updated 08/02/2023   Pt will score greater than 28 on PSFS in order to facilitate improved perception of function.   Baseline: 19   08/02/23: 40  Goal status: MET  2.  Pt will demonstrate at least 150 degrees of active R shoulder elevation in order to demonstrate improved tolerance to functional movement patterns such as reaching overhead and into cabinets.  Baseline: see ROM chart above Goal status:  MET  3.  Pt will demonstrate at least 4+/5 shoulder flex/abd MMT for improved symmetry of UE strength and improved tolerance to functional movements.  Baseline: NT on eval 08/02/23: see objective measures  Goal status: PROGRESSING  4. Pt will report at least 50% decrease in overall pain levels in past week in order to facilitate improved tolerance to basic ADLs/mobility.   Baseline: 0-9/10  08/02/23: 0-6/10  Goal status: ONGOING   5. Pt will report ability to perform upper and lower body dressing with less than 3 pt increase in pain in order to facilitate improved tolerance to ADLs.  Baseline: inc pain with LBD/UBD  Goal status: MET  6. 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  Baseline: avoiding lifting tasks 08/02/23: Goal updated from 5- 20 repetitions; unable to tolerate lifting to shoulder height at this time  Goal  status: ONGOING  7. Patient will be able to perform resisted D1/D2 Extension patterns in order to improve strength for adjusting equipment at work with no more than 3/10 pain.   Baseline: unable to tolerate d/t pain and radicular symptoms  Goal Status: INITIAL as of 08/02/23    PLAN:  PT FREQUENCY: 2x/week  PT DURATION: 4 weeks  PLANNED INTERVENTIONS: 97164- PT Re-evaluation, 97110-Therapeutic exercises, 97530- Therapeutic activity, 97112- Neuromuscular re-education, 97535- Self Care, [id number]- Manual therapy, [id number]- Electrical stimulation (unattended), Patient/Family education, Balance training, Stair training, Taping, Dry Needling, Joint mobilization, Spinal mobilization, Scar mobilization, Cryotherapy, and Moist heat  PLAN FOR NEXT SESSION:  progress functional UQ strength, mobility, including high repetitions to improve muscular endurance.    Arlester Bence, PT, DPT  08/02/2023 6:28 PM

## 2023-08-03 NOTE — Therapy (Signed)
 OUTPATIENT PHYSICAL THERAPY TREATMENT NOTE   Patient Name: Mackenzie Clark MRN: [id number] DOB:October 06, 1965, 58 y.o., female Today's Date: 08/04/2023  END OF SESSION:  PT End of Session - 08/04/23 1401     Visit Number 14    Number of Visits 17    Date for PT Re-Evaluation 08/10/23    Authorization Type workers comp    Authorization Time Period 4/15 12 visits through 5/21 (see media tab)    Authorization - Visit Number 5    Authorization - Number of Visits 12    PT Start Time 1401    PT Stop Time 1440    PT Time Calculation (min) 39 min                 Past Medical History:  Diagnosis Date   Allergy    Arthritis    r shoulder   Diabetes mellitus without complication (HCC)    diet and exercise controlled   Hypertension    Past Surgical History:  Procedure Laterality Date   DILATION AND CURETTAGE OF UTERUS  04/05/2004   16 week stillbirth   IR ANGIOGRAM PELVIS SELECTIVE OR SUPRASELECTIVE  08/31/2019   IR ANGIOGRAM SELECTIVE EACH ADDITIONAL VESSEL  08/31/2019   IR EMBO TUMOR ORGAN ISCHEMIA INFARCT INC GUIDE ROADMAPPING  08/31/2019   IR FLUORO GUIDED NEEDLE PLC ASPIRATION/INJECTION LOC  08/31/2019   IR RADIOLOGIST EVAL & MGMT  07/10/2019   IR RADIOLOGIST EVAL & MGMT  09/18/2019   IR US  GUIDE VASC ACCESS RIGHT  08/31/2019   right shoulder arthroscopy Right 06/02/2023   TONSILLECTOMY     and adnoids   Patient Active Problem List   Diagnosis Date Noted   Status post shoulder surgery 07/12/2023   History of hematuria 03/01/2023   Encounter for general adult medical examination w/o abnormal findings 11/01/2022   Aortic atherosclerosis (HCC) 11/01/2022   Anxiety 11/01/2022   Vitamin D  deficiency disease 11/01/2022   Irregular menses 11/01/2022   Sprain of right ankle 11/01/2022   Pure hypercholesterolemia 05/06/2022   Overweight with body mass index (BMI) of 29 to 29.9 in adult 01/22/2021   Dyslipidemia associated with type 2 diabetes mellitus (HCC) 01/22/2021    Fibroids 08/31/2019   Abnormal uterine bleeding due to intramural leiomyoma 08/31/2019   Abnormal uterine bleeding (AUB) 05/21/2019   Uterine leiomyoma 05/21/2019   Body mass index (BMI) of 30.0-30.9 in adult 09/03/2017   Type II diabetes mellitus, uncontrolled 09/03/2017   Obesity 09/03/2017   Hypertensive heart disease 03/05/2013   Surveillance of previously prescribed contraceptive pill 03/05/2013    PCP: Cleave Curling, MD  REFERRING PROVIDER: Sammye Cristal, MD  REFERRING DIAG: S/P RIGHT SHOULDER SAD , DCE , CUFF DEBRIDEMENT  THERAPY DIAG:  Right shoulder pain, unspecified chronicity  Muscle weakness (generalized)  Rationale for Evaluation and Treatment: Rehabilitation  ONSET DATE: R shoulder cuff debridement, SAD, DCE DOS 06/02/23  Next MD appt: Next month per pt report  SUBJECTIVE:  SUBJECTIVE STATEMENT: 08/04/2023 Pt states she continues to get some mild numbness for a few minutes after PT sessions. Feels mobility is improving. No pain at present but notes she has been using diclofenac  before work.    EVAL: Pt states she is having some shoulder soreness, states follow up with surgeon went well. Pt states she is out of work right now, denies formal restrictions. States she has limitations with ADLs as below, limited primarily by soreness. No N/T, no significant swelling, no redness, no fevers/chills, no drainage from portals.  Hand dominance: Left  PERTINENT HISTORY: HTN, DM2  PAIN:  Are you having pain: 0/10   Worst since last visit: 5/10   Per eval -  Location/description: R shoulder Best-worst over past week: 0-9/10  - aggravating factors: upper body dressing, tying shoes, washing back, doing hair, sleeping - Easing factors: brace, ice pack     PRECAUTIONS: post op R shoulder ;  per paper referral from physician okay for PROM/AAROM/AROM, strengthening   RED FLAGS: None   WEIGHT BEARING RESTRICTIONS: No  FALLS:  Has patient fallen in last 6 months? No  LIVING ENVIRONMENT: 2 story home, no issues with stairs, lives with son . 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  DIAGNOSTIC FINDINGS:  DOS 06/02/23 R shoulder SAD, DCE, cuff debridement  PATIENT SURVEYS:  Patient-specific activity scoring scheme (Point to one number):  "0" represents "unable to perform." "10" represents "able to perform at prior level. 0 1 2 3 4 5 6 7 8 9  10 (Date and Score) Activity Initial  Activity Eval   08/02/23  Fasten bra  5 8   Hair (wash)  0 8  Wash back 5 8  Tie shoes 5 9  Reach into cabinets 4 7  Total 19 40  Avg  3.8 8   Total score = sum of the activity scores/number of activities Minimum detectable change (90%CI) for average score = 2 points Minimum detectable change (90%CI) for single activity score = 3 points Score: 19  COGNITION: Overall cognitive status: Within functional limits for tasks assessed     SENSATION: Denies sensory complaints  POSTURE: Mild guarding RUE  UPPER EXTREMITY ROM:  A/PROM Right eval Left eval Right 06/28/23 Right 07/06/23 Right 07/14/23 Right 08/02/23  Shoulder flexion A: 109 deg  162 A: 120 AA: 146  A: 143 deg  A: 150 deg painful at beginning of arc A: 145 deg   Shoulder abduction A: 128 deg    A: 152 painful at beginning of arc A: 130 deg  Shoulder internal rotation         Shoulder external rotation         Elbow flexion        Elbow extension        Wrist flexion        Wrist extension         (Blank rows = not tested) (Key: WFL = within  functional limits not formally assessed, * = concordant pain, s = stiffness/stretching sensation, NT = not tested)  Comments: PROM testing limited by muscle guarding  UPPER EXTREMITY MMT:  MMT Right 08/02/23 Left 08/02/23  Shoulder flexion 4 5  Shoulder extension    Shoulder abduction 4  4+  Shoulder extension    Shoulder internal rotation 4 5  Shoulder external rotation 4+ 5  Elbow flexion    Elbow extension    Grip strength 40# 65#  (Blank rows = not tested)  (Key: WFL = within functional limits not formally assessed, * = concordant pain, s = stiffness/stretching sensation, NT = not tested)  Comments: deferred given proximity to surgery and pain w/ active mobility  PALPATION/OBSERVATION:  3 portal incisions all well appearing - no redness, swelling, drainage                                                                                                                             TREATMENT DATE:    [facility] Adult PT Treatment:                                                DATE: 08/04/23 Therapeutic Exercise: Red band bicep curl 2x8 BIL cues for elbow positioning Red band tricep push down x8 RUE only, contralat arm stabilizing  Wall slide x8 YTB shoulder flexion AROM (band at wrists) 2x8 cues for posture and ROM <90 HEP update + education/handout  Therapeutic Activity: UE ranger flexion x10 @34 , x10 @ 38 UE ranger scaption x10 @34 , x10 @ 38 UE ranger lateral reach @ 34 x12    [facility] Adult PT Treatment:                                                DATE: 08/02/2023  Therapeutic Exercise: Propped supine double ER 2x8 BIL RTB Supine red band horizontal abd 2x8 Shoulder shrugs x12 emphasis on scapular depression UE ranger scaption 2x10 cues for setup and comfortable ROM UE ranger lateral reach 2x10 cues for setup and comfortable ROM  Neuromuscular re-ed: Ulnar nerve test + glide 2x8  Therapeutic Activity:  Reassessment of objective measures and subjective assessment  regarding progress towards established goals and plan for extended POC to further address work-related rehab goals.     [facility] Adult PT Treatment:                                                DATE: 07/28/2023 Therapeutic Exercise: Propped supine double ER x10 unresisted, x10 w/ red band  Supine horizontal abd unresisted, short lever x10 Supine red band horizontal abd x8 Manually resisted scapular retraction x5 (mild provocation of numbness) Shoulder shrugs x12 emphasis on scapular depression  Neuromuscular re-ed: Ulnar nerve test + glide prescription (2x8) ; extensive time with education, monitoring, rationale for interventions, relevant anatomy/physiology   Therapeutic  Activity: UE ranger scaption 2x10 cues for setup and comfortable ROM UE ranger lateral reach 2x10 cues for setup and comfortable ROM   PATIENT EDUCATION: Education details: rationale for interventions, HEP  Person educated: Patient Education method: Explanation, Demonstration, Tactile cues, Verbal cues Education comprehension: verbalized understanding, returned demonstration, verbal cues required, tactile cues required, and needs further education     HOME EXERCISE PROGRAM: Access Code: [email or web address] URL: [email or web address] Date: 08/04/2023 Prepared by: Mayme Spearman  Exercises - Shoulder Flexion Wall Slide with Towel  - 2-3 x daily - 2 sets - 10 reps - Standing Single Arm Elbow Flexion with Resistance  - 2-3 x daily - 1 sets - 8 reps - Standing Shoulder Row with Anchored Resistance  - 34 x weekly - 2 sets - 8 reps - Isometric Shoulder Flexion at Wall  - 3-4 x weekly - 1 sets - 8 reps - Isometric Shoulder Abduction at Wall  - 2-3 x weekly - 1 sets - 8 reps  ASSESSMENT:  CLINICAL IMPRESSION: 08/04/2023 Pt arrives w/o resting pain, continues to report some fluctuating numbness but overall steady improvement. Today she is able to progress quite well with bicep/tricep resistance training, reintroduction  of resisted scapular strengthening, and GH mobility work. These are accomplished w/ fatigue as expected but no increase in pain, no provocation of numbness. No adverse events. Recommend continuing along current POC in order to address relevant deficits and improve functional tolerance. Pt departs today's session in no acute distress, all voiced questions/concerns addressed appropriately from PT perspective.      OBJECTIVE IMPAIRMENTS: decreased activity tolerance, decreased endurance, decreased mobility, decreased ROM, decreased strength, impaired perceived functional ability, impaired UE functional use, and pain.   ACTIVITY LIMITATIONS: carrying, lifting, bathing, dressing, reach over head, and hygiene/grooming  PARTICIPATION LIMITATIONS: meal prep, cleaning, laundry, community activity, and occupation  PERSONAL FACTORS: Time since onset of injury/illness/exacerbation and 1-2 comorbidities: DM, HTN  are also affecting patient's functional outcome.   REHAB POTENTIAL: Good  CLINICAL DECISION MAKING: Stable/uncomplicated  EVALUATION COMPLEXITY: Low   GOALS:  SHORT TERM GOALS: Target date: 07/13/2023  Pt will demonstrate appropriate understanding and performance of initially prescribed HEP in order to facilitate improved independence with management of symptoms.  Baseline: HEP established  07/12/23: reports good HEP adherence Goal status: MET  2. Pt will report at least 25% improvement in overall pain levels over past week in order to facilitate improved tolerance to typical daily activities.   Baseline: 0-9/10 07/12/23: 0-7/10 08/02/23: 0-6/10 Goal status: NEARLY MET   LONG TERM GOALS: Target date: 08/30/2023, last updated 08/02/2023   Pt will score greater than 28 on PSFS in order to facilitate improved perception of function.   Baseline: 19   08/02/23: 40  Goal status: MET  2.  Pt will demonstrate at least 150 degrees of active R shoulder elevation in order to demonstrate improved  tolerance to functional movement patterns such as reaching overhead and into cabinets.  Baseline: see ROM chart above Goal status: MET  3.  Pt will demonstrate at least 4+/5 shoulder flex/abd MMT for improved symmetry of UE strength and improved tolerance to functional movements.  Baseline: NT on eval 08/02/23: see objective measures  Goal status: PROGRESSING  4. Pt will report at least 50% decrease in overall pain levels in past week in order to facilitate improved tolerance to basic ADLs/mobility.   Baseline: 0-9/10  08/02/23: 0-6/10  Goal status: ONGOING   5. Pt will report ability to perform upper and lower body  dressing with less than 3 pt increase in pain in order to facilitate improved tolerance to ADLs.  Baseline: inc pain with LBD/UBD  Goal status: MET  6. 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  Baseline: avoiding lifting tasks 08/02/23: Goal updated from 5- 20 repetitions; unable to tolerate lifting to shoulder height at this time  Goal status: ONGOING  7. Patient will be able to perform resisted D1/D2 Extension patterns in order to improve strength for adjusting equipment at work with no more than 3/10 pain.   Baseline: unable to tolerate d/t pain and radicular symptoms  Goal Status: INITIAL as of 08/02/23    PLAN:  PT FREQUENCY: 2x/week  PT DURATION: 4 weeks  PLANNED INTERVENTIONS: 97164- PT Re-evaluation, 97110-Therapeutic exercises, 97530- Therapeutic activity, 97112- Neuromuscular re-education, 97535- Self Care, [id number]- Manual therapy, [id number]- Electrical stimulation (unattended), Patient/Family education, Balance training, Stair training, Taping, Dry Needling, Joint mobilization, Spinal mobilization, Scar mobilization, Cryotherapy, and Moist heat  PLAN FOR NEXT SESSION:  progress functional UQ strength, mobility, including high repetitions to  improve muscular endurance.    Lovett Ruck PT, DPT 08/04/2023 2:42 PM

## 2023-08-04 ENCOUNTER — Encounter: Payer: Self-pay | Admitting: Physical Therapy

## 2023-08-04 ENCOUNTER — Ambulatory Visit: Payer: Self-pay | Attending: Orthopedic Surgery | Admitting: Physical Therapy

## 2023-08-04 DIAGNOSIS — M25511 Pain in right shoulder: Secondary | ICD-10-CM | POA: Insufficient documentation

## 2023-08-04 DIAGNOSIS — M6281 Muscle weakness (generalized): Secondary | ICD-10-CM | POA: Insufficient documentation

## 2023-08-10 ENCOUNTER — Ambulatory Visit: Attending: Orthopedic Surgery

## 2023-08-10 DIAGNOSIS — M6281 Muscle weakness (generalized): Secondary | ICD-10-CM | POA: Insufficient documentation

## 2023-08-10 DIAGNOSIS — M25511 Pain in right shoulder: Secondary | ICD-10-CM | POA: Insufficient documentation

## 2023-08-10 NOTE — Therapy (Signed)
 OUTPATIENT PHYSICAL THERAPY TREATMENT NOTE   Patient Name: Mackenzie Clark MRN: [id number] DOB:February 18, 1966, 58 y.o., female Today's Date: 08/10/2023  END OF SESSION:        Past Medical History:  Diagnosis Date   Allergy    Arthritis    r shoulder   Diabetes mellitus without complication (HCC)    diet and exercise controlled   Hypertension    Past Surgical History:  Procedure Laterality Date   DILATION AND CURETTAGE OF UTERUS  04/05/2004   16 week stillbirth   IR ANGIOGRAM PELVIS SELECTIVE OR SUPRASELECTIVE  08/31/2019   IR ANGIOGRAM SELECTIVE EACH ADDITIONAL VESSEL  08/31/2019   IR EMBO TUMOR ORGAN ISCHEMIA INFARCT INC GUIDE ROADMAPPING  08/31/2019   IR FLUORO GUIDED NEEDLE PLC ASPIRATION/INJECTION LOC  08/31/2019   IR RADIOLOGIST EVAL & MGMT  07/10/2019   IR RADIOLOGIST EVAL & MGMT  09/18/2019   IR US  GUIDE VASC ACCESS RIGHT  08/31/2019   right shoulder arthroscopy Right 06/02/2023   TONSILLECTOMY     and adnoids   Patient Active Problem List   Diagnosis Date Noted   Status post shoulder surgery 07/12/2023   History of hematuria 03/01/2023   Encounter for general adult medical examination w/o abnormal findings 11/01/2022   Aortic atherosclerosis (HCC) 11/01/2022   Anxiety 11/01/2022   Vitamin D  deficiency disease 11/01/2022   Irregular menses 11/01/2022   Sprain of right ankle 11/01/2022   Pure hypercholesterolemia 05/06/2022   Overweight with body mass index (BMI) of 29 to 29.9 in adult 01/22/2021   Dyslipidemia associated with type 2 diabetes mellitus (HCC) 01/22/2021   Fibroids 08/31/2019   Abnormal uterine bleeding due to intramural leiomyoma 08/31/2019   Abnormal uterine bleeding (AUB) 05/21/2019   Uterine leiomyoma 05/21/2019   Body mass index (BMI) of 30.0-30.9 in adult 09/03/2017   Type II diabetes mellitus, uncontrolled 09/03/2017   Obesity 09/03/2017   Hypertensive heart disease 03/05/2013   Surveillance of previously prescribed contraceptive  pill 03/05/2013    PCP: Cleave Curling, MD  REFERRING PROVIDER: Sammye Cristal, MD  REFERRING DIAG: S/P RIGHT SHOULDER SAD , DCE , CUFF DEBRIDEMENT  THERAPY DIAG:  No diagnosis found.  Rationale for Evaluation and Treatment: Rehabilitation  ONSET DATE: R shoulder cuff debridement, SAD, DCE DOS 06/02/23  Next MD appt: Next month per pt report  SUBJECTIVE:                                                                                                                                                                                      SUBJECTIVE STATEMENT: 08/10/2023 Pt states she continues to get some mild numbness for a few minutes after PT  sessions. Feels mobility is improving. No pain at present but notes she has been using diclofenac  before work.    EVAL: Pt states she is having some shoulder soreness, states follow up with surgeon went well. Pt states she is out of work right now, denies formal restrictions. States she has limitations with ADLs as below, limited primarily by soreness. No N/T, no significant swelling, no redness, no fevers/chills, no drainage from portals.  Hand dominance: Left  PERTINENT HISTORY: HTN, DM2  PAIN:  Are you having pain: 0/10   Worst since last visit: 5/10   Per eval -  Location/description: R shoulder Best-worst over past week: 0-9/10  - aggravating factors: upper body dressing, tying shoes, washing back, doing hair, sleeping - Easing factors: brace, ice pack     PRECAUTIONS: post op R shoulder ; per paper referral from physician okay for PROM/AAROM/AROM, strengthening   RED FLAGS: None   WEIGHT BEARING RESTRICTIONS: No  FALLS:  Has patient fallen in last 6 months? No  LIVING ENVIRONMENT: 2 story home, no issues with stairs, lives with son . 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  DIAGNOSTIC FINDINGS:  DOS 06/02/23 R shoulder SAD, DCE, cuff debridement  PATIENT SURVEYS:  Patient-specific activity scoring scheme (Point to one number):  "0" represents "unable to perform." "10" represents "able to perform at prior level. 0 1 2 3 4 5 6 7 8 9  10 (Date and Score) Activity Initial  Activity Eval   08/02/23  Fasten bra  5 8   Hair (wash)  0 8  Wash back 5 8  Tie shoes 5 9  Reach into cabinets 4 7  Total 19 40  Avg  3.8 8   Total score = sum of the activity scores/number of activities Minimum detectable change (90%CI) for average score = 2 points Minimum detectable change (90%CI) for single activity score = 3 points Score: 19  COGNITION: Overall cognitive status: Within functional limits for tasks assessed     SENSATION: Denies sensory complaints  POSTURE: Mild guarding RUE  UPPER EXTREMITY ROM:  A/PROM Right eval Left eval Right 06/28/23 Right 07/06/23 Right 07/14/23 Right 08/02/23  Shoulder flexion A: 109 deg  162 A: 120 AA: 146  A: 143 deg  A: 150 deg painful at beginning of arc A: 145 deg   Shoulder abduction A: 128 deg    A: 152 painful at beginning of arc A: 130 deg  Shoulder internal rotation         Shoulder external rotation         Elbow flexion        Elbow extension        Wrist flexion        Wrist extension         (Blank rows = not tested) (Key: WFL = within functional limits not formally assessed, * = concordant pain, s = stiffness/stretching sensation, NT = not tested)  Comments: PROM testing limited by muscle guarding  UPPER EXTREMITY MMT:  MMT Right 08/02/23 Left 08/02/23  Shoulder flexion 4 5  Shoulder extension    Shoulder abduction 4 4+  Shoulder extension    Shoulder internal rotation 4 5  Shoulder external rotation 4+ 5  Elbow flexion    Elbow extension    Grip strength 40# 65#   (Blank rows = not tested)  (Key: WFL = within functional limits not formally assessed, * = concordant pain, s = stiffness/stretching sensation, NT = not tested)  Comments: deferred given proximity to surgery and pain w/ active mobility  PALPATION/OBSERVATION:  3 portal incisions all well appearing - no redness, swelling, drainage                                                                                                                             TREATMENT DATE:    [facility] Adult PT Treatment:                                                DATE: 08/10/2023  Therapeutic Exercise: Red band bicep curl 2x8 BIL cues for elbow positioning Red band tricep push down 2x8 RUE only, contralat arm stabilizing  Wall slide x8 YTB shoulder flexion AROM (band at wrists) 2x10 cues for posture and ROM <90 HEP update + education/handout  Therapeutic Activity: UE ranger flexion 2 x10 @ 38 UE ranger scaption 2 x10 @ 38 UE ranger lateral reach 2 x 10  [facility] Adult PT Treatment:                                                DATE: 08/04/23 Therapeutic Exercise: Red band bicep curl 2x8 BIL cues for elbow positioning Red band tricep push down x8 RUE only, contralat arm stabilizing  Wall slide x8 YTB shoulder flexion AROM (band at wrists) 2x8 cues for posture and ROM <90 HEP update + education/handout  Therapeutic Activity: UE ranger flexion x10 @34 , x10 @ 38 UE ranger scaption x10 @34 , x10 @ 38 UE ranger lateral reach @ 34 x12    [facility] Adult PT Treatment:                                                DATE: 08/02/2023  Therapeutic Exercise: Propped supine double ER 2x8 BIL RTB Supine red band horizontal abd 2x8 Shoulder shrugs x12 emphasis on scapular depression UE ranger scaption 2x10 cues for setup and comfortable ROM UE ranger lateral reach 2x10 cues for setup and comfortable ROM  Neuromuscular re-ed: Ulnar nerve test + glide 2x8  Therapeutic Activity:  Reassessment of objective measures and  subjective assessment regarding progress towards established goals and plan for extended POC to further address work-related rehab goals.     [facility] Adult PT Treatment:  DATE: 07/28/2023 Therapeutic Exercise: Propped supine double ER x10 unresisted, x10 w/ red band  Supine horizontal abd unresisted, short lever x10 Supine red band horizontal abd x8 Manually resisted scapular retraction x5 (mild provocation of numbness) Shoulder shrugs x12 emphasis on scapular depression  Neuromuscular re-ed: Ulnar nerve test + glide prescription (2x8) ; extensive time with education, monitoring, rationale for interventions, relevant anatomy/physiology   Therapeutic Activity: UE ranger scaption 2x10 cues for setup and comfortable ROM UE ranger lateral reach 2x10 cues for setup and comfortable ROM   PATIENT EDUCATION: Education details: rationale for interventions, HEP  Person educated: Patient Education method: Explanation, Demonstration, Tactile cues, Verbal cues Education comprehension: verbalized understanding, returned demonstration, verbal cues required, tactile cues required, and needs further education     HOME EXERCISE PROGRAM: Access Code: [email or web address] URL: [email or web address] Date: 08/04/2023 Prepared by: Mayme Spearman  Exercises - Shoulder Flexion Wall Slide with Towel  - 2-3 x daily - 2 sets - 10 reps - Standing Single Arm Elbow Flexion with Resistance  - 2-3 x daily - 1 sets - 8 reps - Standing Shoulder Row with Anchored Resistance  - 34 x weekly - 2 sets - 8 reps - Isometric Shoulder Flexion at Wall  - 3-4 x weekly - 1 sets - 8 reps - Isometric Shoulder Abduction at Wall  - 2-3 x weekly - 1 sets - 8 reps  ASSESSMENT:  CLINICAL IMPRESSION: 08/10/2023 Pt arrives w/o resting pain, continues to report some fluctuating numbness but overall steady improvement. Today she is able to progress quite well with bicep/tricep resistance  training, reintroduction of resisted scapular strengthening, and GH mobility work. These are accomplished w/ fatigue as expected but no increase in pain, no provocation of numbness. No adverse events. Recommend continuing along current POC in order to address relevant deficits and improve functional tolerance. Pt departs today's session in no acute distress, all voiced questions/concerns addressed appropriately from PT perspective.      OBJECTIVE IMPAIRMENTS: decreased activity tolerance, decreased endurance, decreased mobility, decreased ROM, decreased strength, impaired perceived functional ability, impaired UE functional use, and pain.   ACTIVITY LIMITATIONS: carrying, lifting, bathing, dressing, reach over head, and hygiene/grooming  PARTICIPATION LIMITATIONS: meal prep, cleaning, laundry, community activity, and occupation  PERSONAL FACTORS: Time since onset of injury/illness/exacerbation and 1-2 comorbidities: DM, HTN  are also affecting patient's functional outcome.   REHAB POTENTIAL: Good  CLINICAL DECISION MAKING: Stable/uncomplicated  EVALUATION COMPLEXITY: Low   GOALS:  SHORT TERM GOALS: Target date: 07/13/2023  Pt will demonstrate appropriate understanding and performance of initially prescribed HEP in order to facilitate improved independence with management of symptoms.  Baseline: HEP established  07/12/23: reports good HEP adherence Goal status: MET  2. Pt will report at least 25% improvement in overall pain levels over past week in order to facilitate improved tolerance to typical daily activities.   Baseline: 0-9/10 07/12/23: 0-7/10 08/02/23: 0-6/10 Goal status: NEARLY MET   LONG TERM GOALS: Target date: 08/30/2023, last updated 08/02/2023   Pt will score greater than 28 on PSFS in order to facilitate improved perception of function.   Baseline: 19   08/02/23: 40  Goal status: MET  2.  Pt will demonstrate at least 150 degrees of active R shoulder elevation in order to  demonstrate improved tolerance to functional movement patterns such as reaching overhead and into cabinets.  Baseline: see ROM chart above Goal status: MET  3.  Pt will demonstrate at least 4+/5 shoulder flex/abd MMT for improved symmetry of UE strength  and improved tolerance to functional movements.  Baseline: NT on eval 08/02/23: see objective measures  Goal status: PROGRESSING  4. Pt will report at least 50% decrease in overall pain levels in past week in order to facilitate improved tolerance to basic ADLs/mobility.   Baseline: 0-9/10  08/02/23: 0-6/10  Goal status: ONGOING   5. Pt will report ability to perform upper and lower body dressing with less than 3 pt increase in pain in order to facilitate improved tolerance to ADLs.  Baseline: inc pain with LBD/UBD  Goal status: MET  6. 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  Baseline: avoiding lifting tasks 08/02/23: Goal updated from 5- 20 repetitions; unable to tolerate lifting to shoulder height at this time  Goal status: ONGOING  7. Patient will be able to perform resisted D1/D2 Extension patterns in order to improve strength for adjusting equipment at work with no more than 3/10 pain.   Baseline: unable to tolerate d/t pain and radicular symptoms  Goal Status: INITIAL as of 08/02/23    PLAN:  PT FREQUENCY: 2x/week  PT DURATION: 4 weeks  PLANNED INTERVENTIONS: 97164- PT Re-evaluation, 97110-Therapeutic exercises, 97530- Therapeutic activity, 97112- Neuromuscular re-education, 97535- Self Care, [id number]- Manual therapy, [id number]- Electrical stimulation (unattended), Patient/Family education, Balance training, Stair training, Taping, Dry Needling, Joint mobilization, Spinal mobilization, Scar mobilization, Cryotherapy, and Moist heat  PLAN FOR NEXT SESSION:  progress functional UQ strength, mobility, including high  repetitions to improve muscular endurance.    Lovett Ruck PT, DPT 08/10/2023 4:22 PM

## 2023-08-15 ENCOUNTER — Ambulatory Visit: Admitting: Physical Therapy

## 2023-08-17 ENCOUNTER — Ambulatory Visit

## 2023-08-17 DIAGNOSIS — M6281 Muscle weakness (generalized): Secondary | ICD-10-CM

## 2023-08-17 DIAGNOSIS — M25511 Pain in right shoulder: Secondary | ICD-10-CM

## 2023-08-17 NOTE — Therapy (Signed)
 OUTPATIENT PHYSICAL THERAPY TREATMENT NOTE   Patient Name: Mackenzie Clark MRN: [id number] DOB:Mar 31, 1966, 58 y.o., female Today's Date: 08/17/2023  END OF SESSION:  PT End of Session - 08/17/23 1653     Visit Number 16    Number of Visits 17    Date for PT Re-Evaluation 08/30/23    Authorization Type workers comp    Authorization Time Period 4/15 12 visits through 5/21 (see media tab)    Authorization - Visit Number 7    Authorization - Number of Visits 12    PT Start Time 1620    PT Stop Time 1655    PT Time Calculation (min) 35 min    Activity Tolerance Patient tolerated treatment well    Behavior During Therapy WFL for tasks assessed/performed                    Past Medical History:  Diagnosis Date   Allergy    Arthritis    r shoulder   Diabetes mellitus without complication (HCC)    diet and exercise controlled   Hypertension    Past Surgical History:  Procedure Laterality Date   DILATION AND CURETTAGE OF UTERUS  04/05/2004   16 week stillbirth   IR ANGIOGRAM PELVIS SELECTIVE OR SUPRASELECTIVE  08/31/2019   IR ANGIOGRAM SELECTIVE EACH ADDITIONAL VESSEL  08/31/2019   IR EMBO TUMOR ORGAN ISCHEMIA INFARCT INC GUIDE ROADMAPPING  08/31/2019   IR FLUORO GUIDED NEEDLE PLC ASPIRATION/INJECTION LOC  08/31/2019   IR RADIOLOGIST EVAL & MGMT  07/10/2019   IR RADIOLOGIST EVAL & MGMT  09/18/2019   IR US  GUIDE VASC ACCESS RIGHT  08/31/2019   right shoulder arthroscopy Right 06/02/2023   TONSILLECTOMY     and adnoids   Patient Active Problem List   Diagnosis Date Noted   Status post shoulder surgery 07/12/2023   History of hematuria 03/01/2023   Encounter for general adult medical examination w/o abnormal findings 11/01/2022   Aortic atherosclerosis (HCC) 11/01/2022   Anxiety 11/01/2022   Vitamin D  deficiency disease 11/01/2022   Irregular menses 11/01/2022   Sprain of right ankle 11/01/2022   Pure hypercholesterolemia 05/06/2022   Overweight with body  mass index (BMI) of 29 to 29.9 in adult 01/22/2021   Dyslipidemia associated with type 2 diabetes mellitus (HCC) 01/22/2021   Fibroids 08/31/2019   Abnormal uterine bleeding due to intramural leiomyoma 08/31/2019   Abnormal uterine bleeding (AUB) 05/21/2019   Uterine leiomyoma 05/21/2019   Body mass index (BMI) of 30.0-30.9 in adult 09/03/2017   Type II diabetes mellitus, uncontrolled 09/03/2017   Obesity 09/03/2017   Hypertensive heart disease 03/05/2013   Surveillance of previously prescribed contraceptive pill 03/05/2013    PCP: Cleave Curling, MD  REFERRING PROVIDER: Sammye Cristal, MD  REFERRING DIAG: S/P RIGHT SHOULDER SAD , DCE , CUFF DEBRIDEMENT  THERAPY DIAG:  Right shoulder pain, unspecified chronicity  Muscle weakness (generalized)  Rationale for Evaluation and Treatment: Rehabilitation  ONSET DATE: R shoulder cuff debridement, SAD, DCE DOS 06/02/23  Next MD appt: Next month per pt report  SUBJECTIVE:  SUBJECTIVE STATEMENT: 08/17/2023 Patient reporting perceived 75% improvement since surgery. She would like to get back to normal, including ability to adjust machine at work without pain, and lifting in order to return to gardening.    Hand dominance: Left  PERTINENT HISTORY: HTN, DM2  PAIN:  Are you having pain: 0/10   Worst since last visit: 5/10   Per eval -  Location/description: R shoulder Best-worst over past week: 0-9/10  - aggravating factors: upper body dressing, tying shoes, washing back, doing hair, sleeping - Easing factors: brace, ice pack     PRECAUTIONS: post op R shoulder ; per paper referral from physician okay for PROM/AAROM/AROM, strengthening   RED FLAGS: None   WEIGHT BEARING RESTRICTIONS: No  FALLS:  Has patient fallen in last 6 months? No  LIVING  ENVIRONMENT: 2 story home, no issues with stairs, lives with son . 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  DIAGNOSTIC FINDINGS:  DOS 06/02/23 R shoulder SAD, DCE, cuff debridement  PATIENT SURVEYS:  Patient-specific activity scoring scheme (Point to one number):  "0" represents "unable to perform." "10" represents "able to perform at prior level. 0 1 2 3 4 5 6 7 8 9  10 (Date and Score) Activity Initial  Activity Eval   08/02/23  Fasten bra  5 8   Hair (wash)  0 8  Wash back 5 8  Tie shoes 5 9  Reach into cabinets 4 7  Total 19 40  Avg  3.8 8   Total score = sum of the activity scores/number of activities Minimum detectable change (90%CI) for average score = 2 points Minimum detectable change (90%CI) for single activity score = 3 points Score: 19  COGNITION: Overall cognitive status: Within functional limits for tasks assessed     SENSATION: Denies sensory complaints  POSTURE: Mild guarding RUE  UPPER EXTREMITY ROM:  A/PROM Right eval Left eval Right 06/28/23 Right 07/06/23 Right 07/14/23 Right 08/02/23  Shoulder flexion A: 109 deg  162 A: 120 AA: 146  A: 143 deg  A: 150 deg painful at beginning of arc A: 145 deg   Shoulder abduction A: 128 deg    A: 152 painful at beginning of arc A: 130 deg  Shoulder internal rotation         Shoulder external rotation         Elbow flexion        Elbow extension        Wrist flexion        Wrist extension         (Blank rows = not tested) (Key: WFL = within functional limits not formally assessed, * = concordant pain, s = stiffness/stretching sensation, NT = not tested)  Comments: PROM testing limited by muscle guarding  UPPER EXTREMITY MMT:  MMT  Right 08/02/23 Left 08/02/23  Shoulder flexion 4 5  Shoulder extension    Shoulder abduction 4 4+  Shoulder extension    Shoulder internal rotation 4 5  Shoulder external rotation 4+ 5  Elbow flexion    Elbow extension    Grip strength 40# 65#  (Blank rows = not tested)  (Key: WFL = within functional limits not formally assessed, * = concordant pain, s =  stiffness/stretching sensation, NT = not tested)  Comments: deferred given proximity to surgery and pain w/ active mobility  PALPATION/OBSERVATION:  3 portal incisions all well appearing - no redness, swelling, drainage                                                                                                                             TREATMENT DATE:    [facility] PT Treatment:                                                DATE: 08/17/2023  Therapeutic Exercise: Wall slide 2 x 10 flexion  Wall slide 2 x 10 abduction  YTB shoulder flexion AROM (band at wrists) 2x10 cues for posture Seated shrugs 4lb dumbbell BIL 2 x 10  Standing body blade 2 x 15 seconds each, verbal and visual cueing needed 90 deg shoulder flexion, anterior "punches"  0 deg shoulder flexion, inferior "punches"  90 deg shoulder flexion, inferior  90 deg abduction, lateral  UE ranger flexion 2 x10 @ 38 UE ranger scaption 2 x10 @ 38 UE ranger lateral reach 2 x 10   [facility] Adult PT Treatment:                                                DATE: 08/10/2023  Therapeutic Exercise: Red band bicep curl 2x8 BIL cues for elbow positioning Red band tricep push down 2x8 RUE only, contralat arm stabilizing  Wall slide x8 YTB shoulder flexion AROM (band at wrists) 2x10 cues for posture and ROM <90 Seated shrugs 3lb dumbbell BIL 2 x 10  Standing body blade 2 x 15 seconds each, verbal and visual cueing needed 90 deg shoulder flexion, anterior "punches"  0 deg shoulder flexion, inferior "punches"  UE ranger flexion 2 x10 @ 38 UE ranger scaption 2 x10 @ 38 UE ranger  lateral reach 2 x 10  [facility] Adult PT Treatment:                                                DATE: 08/04/23 Therapeutic Exercise: Red band bicep curl 2x8 BIL cues for elbow positioning Red band tricep push down x8 RUE only, contralat arm stabilizing  Wall slide x8 YTB shoulder flexion AROM (band at wrists) 2x8 cues for posture and ROM <90 HEP update + education/handout  Therapeutic Activity: UE ranger flexion x10 @34 , x10 @ 38 UE ranger scaption x10 @34 , x10 @ 38 UE ranger lateral reach @ 34 x12    [facility] Adult PT Treatment:  DATE: 08/02/2023  Therapeutic Exercise: Propped supine double ER 2x8 BIL RTB Supine red band horizontal abd 2x8 Shoulder shrugs x12 emphasis on scapular depression UE ranger scaption 2x10 cues for setup and comfortable ROM UE ranger lateral reach 2x10 cues for setup and comfortable ROM  Neuromuscular re-ed: Ulnar nerve test + glide 2x8  Therapeutic Activity:  Reassessment of objective measures and subjective assessment regarding progress towards established goals and plan for extended POC to further address work-related rehab goals.     PATIENT EDUCATION: Education details: rationale for interventions, HEP  Person educated: Patient Education method: Explanation, Demonstration, Tactile cues, Verbal cues Education comprehension: verbalized understanding, returned demonstration, verbal cues required, tactile cues required, and needs further education     HOME EXERCISE PROGRAM: Access Code: [email or web address] URL: [email or web address] Date: 08/04/2023 Prepared by: Mayme Spearman  Exercises - Shoulder Flexion Wall Slide with Towel  - 2-3 x daily - 2 sets - 10 reps - Standing Single Arm Elbow Flexion with Resistance  - 2-3 x daily - 1 sets - 8 reps - Standing Shoulder Row with Anchored Resistance  - 34 x weekly - 2 sets - 8 reps - Isometric Shoulder Flexion at Wall  - 3-4 x weekly - 1 sets - 8 reps -  Isometric Shoulder Abduction at Wall  - 2-3 x weekly - 1 sets - 8 reps  ASSESSMENT:  CLINICAL IMPRESSION: 08/17/2023 Gordan Latina had good tolerance of today's treatment session, with ongoing progression of scapular strengthening and GH mobility. She was able to tolerate additional directions using Body Blade to address dynamic stabilization. She would benefit from additional time spent with functional movements and strengthening. We will continue to progress per POC as tolerated, in order to reach established rehab goals.     OBJECTIVE IMPAIRMENTS: decreased activity tolerance, decreased endurance, decreased mobility, decreased ROM, decreased strength, impaired perceived functional ability, impaired UE functional use, and pain.   ACTIVITY LIMITATIONS: carrying, lifting, bathing, dressing, reach over head, and hygiene/grooming  PARTICIPATION LIMITATIONS: meal prep, cleaning, laundry, community activity, and occupation  PERSONAL FACTORS: Time since onset of injury/illness/exacerbation and 1-2 comorbidities: DM, HTN are also affecting patient's functional outcome.   REHAB POTENTIAL: Good  CLINICAL DECISION MAKING: Stable/uncomplicated  EVALUATION COMPLEXITY: Low   GOALS:  SHORT TERM GOALS: Target date: 07/13/2023  Pt will demonstrate appropriate understanding and performance of initially prescribed HEP in order to facilitate improved independence with management of symptoms.  Baseline: HEP established  07/12/23: reports good HEP adherence Goal status: MET  2. Pt will report at least 25% improvement in overall pain levels over past week in order to facilitate improved tolerance to typical daily activities.   Baseline: 0-9/10 07/12/23: 0-7/10 08/02/23: 0-6/10 Goal status: NEARLY MET   LONG TERM GOALS: Target date: 08/30/2023, last updated 08/02/2023   Pt will score greater than 28 on PSFS in order to facilitate improved perception of function.   Baseline: 19   08/02/23: 40  Goal status:  MET  2.  Pt will demonstrate at least 150 degrees of active R shoulder elevation in order to demonstrate improved tolerance to functional movement patterns such as reaching overhead and into cabinets.  Baseline: see ROM chart above Goal status: MET  3.  Pt will demonstrate at least 4+/5 shoulder flex/abd MMT for improved symmetry of UE strength and improved tolerance to functional movements.  Baseline: NT on eval 08/02/23: see objective measures  Goal status: PROGRESSING  4. Pt will report at least 50% decrease in overall pain levels in past week  in order to facilitate improved tolerance to basic ADLs/mobility.   Baseline: 0-9/10  08/02/23: 0-6/10  Goal status: ONGOING   5. Pt will report ability to perform upper and lower body dressing with less than 3 pt increase in pain in order to facilitate improved tolerance to ADLs.  Baseline: inc pain with LBD/UBD  Goal status: MET  6. 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  Baseline: avoiding lifting tasks 08/02/23: Goal updated from 5- 20 repetitions; unable to tolerate lifting to shoulder height at this time  Goal status: ONGOING  7. Patient will be able to perform resisted D1/D2 Extension patterns in order to improve strength for adjusting equipment at work with no more than 3/10 pain.   Baseline: unable to tolerate d/t pain and radicular symptoms  Goal Status: INITIAL as of 08/02/23    PLAN:  PT FREQUENCY: 2x/week  PT DURATION: 4 weeks  PLANNED INTERVENTIONS: 97164- PT Re-evaluation, 97110-Therapeutic exercises, 97530- Therapeutic activity, 97112- Neuromuscular re-education, 97535- Self Care, [id number]- Manual therapy, [id number]- Electrical stimulation (unattended), Patient/Family education, Balance training, Stair training, Taping, Dry Needling, Joint mobilization, Spinal mobilization, Scar mobilization, Cryotherapy, and  Moist heat  PLAN FOR NEXT SESSION:  progress functional UQ strength, mobility, including high repetitions to improve muscular endurance.    Arlester Bence, PT, DPT  08/17/2023 6:39 PM

## 2023-08-21 ENCOUNTER — Other Ambulatory Visit (HOSPITAL_COMMUNITY): Payer: Self-pay

## 2023-08-22 ENCOUNTER — Encounter: Payer: Self-pay | Admitting: Physical Therapy

## 2023-08-22 ENCOUNTER — Ambulatory Visit: Admitting: Physical Therapy

## 2023-08-22 DIAGNOSIS — M25511 Pain in right shoulder: Secondary | ICD-10-CM | POA: Diagnosis not present

## 2023-08-22 DIAGNOSIS — M6281 Muscle weakness (generalized): Secondary | ICD-10-CM

## 2023-08-22 NOTE — Therapy (Signed)
 OUTPATIENT PHYSICAL THERAPY PROGRESS NOTE + RECERTIFICATION   Patient Name: Mackenzie Clark MRN: [id number] DOB:1965/06/19, 58 y.o., female Today's Date: 08/22/2023   Progress Note Reporting Period 08/02/23 to 08/22/23  See note below for Objective Data and Assessment of Progress/Goals.      END OF SESSION:  PT End of Session - 08/22/23 1620     Visit Number 17    Number of Visits 25    Date for PT Re-Evaluation 09/19/23    Authorization Type workers comp    Authorization Time Period 4/15 12 visits through 5/21 (see media tab)    Authorization - Visit Number 8    Authorization - Number of Visits 12    PT Start Time 1621    PT Stop Time 1702    PT Time Calculation (min) 41 min    Activity Tolerance Patient tolerated treatment well                     Past Medical History:  Diagnosis Date   Allergy    Arthritis    r shoulder   Diabetes mellitus without complication (HCC)    diet and exercise controlled   Hypertension    Past Surgical History:  Procedure Laterality Date   DILATION AND CURETTAGE OF UTERUS  04/05/2004   16 week stillbirth   IR ANGIOGRAM PELVIS SELECTIVE OR SUPRASELECTIVE  08/31/2019   IR ANGIOGRAM SELECTIVE EACH ADDITIONAL VESSEL  08/31/2019   IR EMBO TUMOR ORGAN ISCHEMIA INFARCT INC GUIDE ROADMAPPING  08/31/2019   IR FLUORO GUIDED NEEDLE PLC ASPIRATION/INJECTION LOC  08/31/2019   IR RADIOLOGIST EVAL & MGMT  07/10/2019   IR RADIOLOGIST EVAL & MGMT  09/18/2019   IR US  GUIDE VASC ACCESS RIGHT  08/31/2019   right shoulder arthroscopy Right 06/02/2023   TONSILLECTOMY     and adnoids   Patient Active Problem List   Diagnosis Date Noted   Status post shoulder surgery 07/12/2023   History of hematuria 03/01/2023   Encounter for general adult medical examination w/o abnormal findings 11/01/2022   Aortic atherosclerosis (HCC) 11/01/2022   Anxiety 11/01/2022   Vitamin D  deficiency disease 11/01/2022   Irregular menses 11/01/2022   Sprain  of right ankle 11/01/2022   Pure hypercholesterolemia 05/06/2022   Overweight with body mass index (BMI) of 29 to 29.9 in adult 01/22/2021   Dyslipidemia associated with type 2 diabetes mellitus (HCC) 01/22/2021   Fibroids 08/31/2019   Abnormal uterine bleeding due to intramural leiomyoma 08/31/2019   Abnormal uterine bleeding (AUB) 05/21/2019   Uterine leiomyoma 05/21/2019   Body mass index (BMI) of 30.0-30.9 in adult 09/03/2017   Type II diabetes mellitus, uncontrolled 09/03/2017   Obesity 09/03/2017   Hypertensive heart disease 03/05/2013   Surveillance of previously prescribed contraceptive pill 03/05/2013    PCP: Cleave Curling, MD  REFERRING PROVIDER: Sammye Cristal, MD  REFERRING DIAG: S/P RIGHT SHOULDER SAD , DCE , CUFF DEBRIDEMENT  THERAPY DIAG:  Right shoulder pain, unspecified chronicity  Muscle weakness (generalized)  Rationale for Evaluation and Treatment: Rehabilitation  ONSET DATE: R shoulder cuff debridement, SAD, DCE DOS 06/02/23  Next MD appt: Next month per pt report  SUBJECTIVE:  SUBJECTIVE STATEMENT: 08/23/2023 Pt states she just got done with surgeon visit. States her numbness has resolved with practicing her nerve glides. States she still feels pain with reaching and lifting. Notes improvements in sleep and overall pain levels. States symptoms have been less irritable recently and feels she could benefit from more PT.    Hand dominance: Left  PERTINENT HISTORY: HTN, DM2  PAIN:  Are you having pain: 0/10   Worst since last visit: 4/10 transiently (updated 08/22/23)  Per eval -  Location/description: R shoulder Best-worst over past week: 0-9/10  - aggravating factors: upper body dressing, tying shoes, washing back, doing hair, sleeping - Easing factors: brace, ice pack      PRECAUTIONS: post op R shoulder ; per paper referral from physician okay for PROM/AAROM/AROM, strengthening   RED FLAGS: None   WEIGHT BEARING RESTRICTIONS: No  FALLS:  Has patient fallen in last 6 months? No  LIVING ENVIRONMENT: 2 story home, no issues with stairs, lives with son . 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  DIAGNOSTIC FINDINGS:  DOS 06/02/23 R shoulder SAD, DCE, cuff debridement  PATIENT SURVEYS:  Patient-specific activity scoring scheme (Point to one number):  "0" represents "unable to perform." "10" represents "able to perform at prior level. 0 1 2 3 4 5 6 7 8 9  10 (Date and Score) Activity Initial  Activity Eval   08/02/23 08/22/23  Fasten bra  5 8  9   Hair (wash)  0 8 9  Wash back 5 8 8   Tie shoes 5 9 10   Reach into cabinets 4 7 9   Total 19 40 45  Avg  3.8 8 9    Total score = sum of the activity scores/number of activities Minimum detectable change (90%CI) for average score = 2 points Minimum detectable change (90%CI) for single activity score = 3 points Score: 19  COGNITION: Overall cognitive status: Within functional limits for tasks assessed     SENSATION: Denies sensory complaints  POSTURE: Mild guarding RUE  UPPER EXTREMITY ROM:  A/PROM Right eval Left eval Right 06/28/23 Right 07/06/23 Right 07/14/23 Right 08/02/23 Right 08/22/23  Shoulder flexion A: 109 deg  162 A: 120 AA: 146  A: 143 deg  A: 150 deg painful at beginning of arc A: 145 deg  A: 163 deg  Shoulder abduction A: 128 deg    A: 152 painful at beginning of arc A: 130 deg A: 154 deg  Shoulder internal rotation          Shoulder external rotation          Elbow flexion         Elbow extension          Wrist flexion         Wrist extension          (Blank rows = not tested) (Key: WFL = within functional limits not formally assessed, * = concordant pain, s = stiffness/stretching sensation, NT = not tested)  Comments: PROM testing limited by muscle guarding  UPPER EXTREMITY MMT:  MMT Right 08/02/23 Left 08/02/23 R/L 08/22/23  Shoulder flexion 4 5 4+/5  Shoulder extension     Shoulder abduction 4 4+ 4+/5  Shoulder extension  Shoulder internal rotation 4 5 4+/5  Shoulder external rotation 4+ 5 4+/5  Elbow flexion     Elbow extension     Grip strength 40# 65#   (Blank rows = not tested)  (Key: WFL = within functional limits not formally assessed, * = concordant pain, s = stiffness/stretching sensation, NT = not tested)  Comments: deferred given proximity to surgery and pain w/ active mobility  PALPATION/OBSERVATION:  3 portal incisions all well appearing - no redness, swelling, drainage  FUNCTIONAL TESTING: 08/22/23:  - 2# unilateral reach, waist<>cabinet (~ eye level) 10 repetitions low RPE  - 3# unilateral reach, waist<>cabinet) 15 repetitions with moderate/hard RPE, no pain                                                                                                                              TREATMENT DATE:   [facility] Adult PT Treatment:                                                DATE: 08/22/23  Neuromuscular re-ed: D1 PNF x8 RUE, manually resisted based on pt pain free tolerance 5# BIL shrug x15 emphasis on eccentric portion Green band row 2x8 cues for reduced UT compensations  Therapeutic Activity: UE ranger flexion>abd arc 2x8 RUE 2# unilat waist<>cabinet reach x10 3# unilat waist<>cabinet reach x15  4# BIL waist<>cabinet reach x10 Education/discussion re: progress with PT, symptom behavior as it affects activity tolerance, PT goals/POC  Time spent discussing work setup, activity tolerance, strategies to simulate work movements in PT sessions     [facility]  Adult PT Treatment:                                                DATE: 08/17/2023  Therapeutic Exercise: Wall slide 2 x 10 flexion  Wall slide 2 x 10 abduction  YTB shoulder flexion AROM (band at wrists) 2x10 cues for posture Seated shrugs 4lb dumbbell BIL 2 x 10  Standing body blade 2 x 15 seconds each, verbal and visual cueing needed 90 deg shoulder flexion, anterior "punches"  0 deg shoulder flexion, inferior "punches"  90 deg shoulder flexion, inferior  90 deg abduction, lateral  UE ranger flexion 2 x10 @ 38 UE ranger scaption 2 x10 @ 38 UE ranger lateral reach 2 x 10   [facility] Adult PT Treatment:                                                DATE: 08/10/2023  Therapeutic Exercise: Red band bicep curl  2x8 BIL cues for elbow positioning Red band tricep push down 2x8 RUE only, contralat arm stabilizing  Wall slide x8 YTB shoulder flexion AROM (band at wrists) 2x10 cues for posture and ROM <90 Seated shrugs 3lb dumbbell BIL 2 x 10  Standing body blade 2 x 15 seconds each, verbal and visual cueing needed 90 deg shoulder flexion, anterior "punches"  0 deg shoulder flexion, inferior "punches"  UE ranger flexion 2 x10 @ 38 UE ranger scaption 2 x10 @ 38 UE ranger lateral reach 2 x 10    PATIENT EDUCATION: Education details: rationale for interventions, HEP  Person educated: Patient Education method: Explanation, Demonstration, Tactile cues, Verbal cues Education comprehension: verbalized understanding, returned demonstration, verbal cues required, tactile cues required, and needs further education     HOME EXERCISE PROGRAM: Access Code: [email or web address] URL: [email or web address] Date: 08/04/2023 Prepared by: Mayme Spearman  Exercises - Shoulder Flexion Wall Slide with Towel  - 2-3 x daily - 2 sets - 10 reps - Standing Single Arm Elbow Flexion with Resistance  - 2-3 x daily - 1 sets - 8 reps - Standing Shoulder Row with Anchored Resistance  - 34 x weekly - 2 sets - 8  reps - Isometric Shoulder Flexion at Wall  - 3-4 x weekly - 1 sets - 8 reps - Isometric Shoulder Abduction at Wall  - 2-3 x weekly - 1 sets - 8 reps  ASSESSMENT:  CLINICAL IMPRESSION: 08/23/2023 Pt arrives w/o resting pain, reports resolved numbness and improving pain levels in recent visits. 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 Looking at MSK assessment today pt demonstrates significant improvement in ROM and MMT. Able to reintroduce rows and progress manually resisted D1 pattern. She is able to tolerate waist<>cabinet lifting assessment without pain but does endorse limitations secondary to muscular fatigue/weakness. No adverse events, denies any pain on departure. Overall pt has made good progress compared to start of care, particularly in recent visits in regards to pain/tolerance; recommend extension of POC as described below in order to address remaining strength/activity tolerance deficits for improved tolerance to work tasks. Pt departs today's session in no acute distress, all voiced questions/concerns addressed appropriately from PT perspective.     OBJECTIVE IMPAIRMENTS: decreased activity tolerance, decreased endurance, decreased mobility, decreased ROM, decreased strength, impaired perceived functional ability, impaired UE functional use, and pain.   ACTIVITY LIMITATIONS: carrying, lifting, bathing, dressing, reach over head, and hygiene/grooming  PARTICIPATION LIMITATIONS: meal prep, cleaning, laundry, community activity, and occupation  PERSONAL FACTORS: Time since onset of injury/illness/exacerbation and 1-2 comorbidities: DM, HTN are also affecting patient's functional outcome.   REHAB POTENTIAL: Good  CLINICAL DECISION MAKING: Stable/uncomplicated  EVALUATION COMPLEXITY: Low   GOALS:  SHORT TERM GOALS: Target date: 07/13/2023  Pt will  demonstrate appropriate understanding and performance of initially prescribed HEP in order to facilitate improved independence with management of symptoms.  Baseline: HEP established  07/12/23: reports good HEP adherence Goal status: MET  2. Pt will report at least 25% improvement in overall pain levels over past week in order to facilitate improved tolerance to typical daily activities.   Baseline: 0-9/10 07/12/23: 0-7/10 08/02/23: 0-6/10 08/22/23: 0-4/10 Goal status: MET  LONG TERM GOALS: Target date: 09/19/2023  last updated 08/22/23   Pt will score greater than 28 on PSFS in order to facilitate improved perception of function.   Baseline: 19   08/02/23: 40  Goal status: MET  2.  Pt will demonstrate at least 150 degrees of active R shoulder elevation in order to demonstrate improved tolerance to functional movement patterns such as reaching overhead and into cabinets.  Baseline: see ROM chart above Goal status: MET  3.  Pt will demonstrate at least 4+/5 shoulder flex/abd MMT for improved symmetry of UE strength and improved tolerance to functional movements.  Baseline: NT on eval 08/02/23: see objective measures  08/22/23: see MMT chart  Goal status: MET  4. Pt will report at least 50% decrease in overall pain levels in past week in order to facilitate improved tolerance to basic ADLs/mobility.   Baseline: 0-9/10  08/02/23: 0-6/10  08/22/23: 0-4/10  Goal status: MET  5. Pt will report ability to perform upper and lower body dressing with less than 3 pt increase in pain in order to facilitate improved tolerance to ADLs.  Baseline: inc pain with LBD/UBD  08/22/23: does not endorse any issues  Goal status: MET  6. Pt will be able to move 5# from waist to shoulder height for 20 repetitions in order to indicate improved functional shoulder endurance for work tasks.  Baseline: avoiding lifting tasks 08/02/23: Goal updated from 5- 20 repetitions; unable to tolerate lifting to shoulder height  at this time 08/22/23: able to lift up to 3# for 15 repetitions, limited by fatigue  Goal status: PROGRESSING  7. Patient will be able to perform resisted D1/D2 Extension patterns in order to improve strength for adjusting equipment at work with no more than 3/10 pain.   Baseline: unable to tolerate d/t pain and radicular symptoms  08/22/23: moderate resistance without pain, does endorse fatigue  Goal Status: MET    PLAN: (updated 08/22/23)  PT FREQUENCY: 2x/week  PT DURATION: 4 weeks  PLANNED INTERVENTIONS: 21308- PT Re-evaluation, 97110-Therapeutic exercises, 97530- Therapeutic activity, 97112- Neuromuscular re-education, 97535- Self Care, [id number]- Manual therapy, [id number]- Electrical stimulation (unattended), Patient/Family education, Balance training, Stair training, Taping, Dry Needling, Joint mobilization, Spinal mobilization, Scar mobilization, Cryotherapy, and Moist heat  PLAN FOR NEXT SESSION:  continue progressing R GH work within pt tolerance, with emphasis on muscular strength/endurance around shoulder height. Work task simulations   Lovett Ruck PT, DPT 08/23/2023 7:40 AM

## 2023-08-24 ENCOUNTER — Encounter: Payer: Self-pay | Admitting: Physical Therapy

## 2023-08-24 ENCOUNTER — Ambulatory Visit: Admitting: Physical Therapy

## 2023-08-24 DIAGNOSIS — M25511 Pain in right shoulder: Secondary | ICD-10-CM | POA: Diagnosis not present

## 2023-08-24 DIAGNOSIS — M6281 Muscle weakness (generalized): Secondary | ICD-10-CM

## 2023-08-24 NOTE — Therapy (Signed)
 OUTPATIENT PHYSICAL THERAPY TREATMENT   Patient Name: Mackenzie Clark MRN: [id number] DOB:1965/07/19, 58 y.o., female Today's Date: 08/22/2023     END OF SESSION:  PT End of Session - 08/24/23 1625     Visit Number 18    Number of Visits 25    Date for PT Re-Evaluation 09/19/23    Authorization Type workers comp    Authorization Time Period 4/15 12 visits through 5/21 (see media tab)    Authorization - Visit Number 9    Authorization - Number of Visits 12    PT Start Time 1626   late check in   PT Stop Time 1701    PT Time Calculation (min) 35 min    Activity Tolerance Patient tolerated treatment well                      Past Medical History:  Diagnosis Date   Allergy    Arthritis    r shoulder   Diabetes mellitus without complication (HCC)    diet and exercise controlled   Hypertension    Past Surgical History:  Procedure Laterality Date   DILATION AND CURETTAGE OF UTERUS  04/05/2004   16 week stillbirth   IR ANGIOGRAM PELVIS SELECTIVE OR SUPRASELECTIVE  08/31/2019   IR ANGIOGRAM SELECTIVE EACH ADDITIONAL VESSEL  08/31/2019   IR EMBO TUMOR ORGAN ISCHEMIA INFARCT INC GUIDE ROADMAPPING  08/31/2019   IR FLUORO GUIDED NEEDLE PLC ASPIRATION/INJECTION LOC  08/31/2019   IR RADIOLOGIST EVAL & MGMT  07/10/2019   IR RADIOLOGIST EVAL & MGMT  09/18/2019   IR US  GUIDE VASC ACCESS RIGHT  08/31/2019   right shoulder arthroscopy Right 06/02/2023   TONSILLECTOMY     and adnoids   Patient Active Problem List   Diagnosis Date Noted   Status post shoulder surgery 07/12/2023   History of hematuria 03/01/2023   Encounter for general adult medical examination w/o abnormal findings 11/01/2022   Aortic atherosclerosis (HCC) 11/01/2022   Anxiety 11/01/2022   Vitamin D  deficiency disease 11/01/2022   Irregular menses 11/01/2022   Sprain of right ankle 11/01/2022   Pure hypercholesterolemia 05/06/2022   Overweight with body mass index (BMI) of 29 to 29.9 in adult  01/22/2021   Dyslipidemia associated with type 2 diabetes mellitus (HCC) 01/22/2021   Fibroids 08/31/2019   Abnormal uterine bleeding due to intramural leiomyoma 08/31/2019   Abnormal uterine bleeding (AUB) 05/21/2019   Uterine leiomyoma 05/21/2019   Body mass index (BMI) of 30.0-30.9 in adult 09/03/2017   Type II diabetes mellitus, uncontrolled 09/03/2017   Obesity 09/03/2017   Hypertensive heart disease 03/05/2013   Surveillance of previously prescribed contraceptive pill 03/05/2013    PCP: Cleave Curling, MD  REFERRING PROVIDER: Sammye Cristal, MD  REFERRING DIAG: S/P RIGHT SHOULDER SAD , DCE , CUFF DEBRIDEMENT  THERAPY DIAG:  Right shoulder pain, unspecified chronicity  Muscle weakness (generalized)  Rationale for Evaluation and Treatment: Rehabilitation  ONSET DATE: R shoulder cuff debridement, SAD, DCE DOS 06/02/23  Next MD appt: Next month per pt report  SUBJECTIVE:  SUBJECTIVE STATEMENT: 08/24/2023 No pain at present, no issues after last session.     Hand dominance: Left  PERTINENT HISTORY: HTN, DM2  PAIN:  Are you having pain: 0/10   Worst since last visit: 4/10 transiently (updated 08/22/23)  Per eval -  Location/description: R shoulder Best-worst over past week: 0-9/10  - aggravating factors: upper body dressing, tying shoes, washing back, doing hair, sleeping - Easing factors: brace, ice pack     PRECAUTIONS: post op R shoulder ; per paper referral from physician okay for PROM/AAROM/AROM, strengthening   RED FLAGS: None   WEIGHT BEARING RESTRICTIONS: No  FALLS:  Has patient fallen in last 6 months? No  LIVING ENVIRONMENT: 2 story home, no issues with stairs, lives with son . 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  DIAGNOSTIC FINDINGS:  DOS 06/02/23 R shoulder SAD, DCE, cuff debridement  PATIENT SURVEYS:  Patient-specific activity scoring scheme (Point to one number):  "0" represents "unable to perform." "10" represents "able to perform at prior level. 0 1 2 3 4 5 6 7 8 9  10 (Date and Score) Activity Initial  Activity Eval   08/02/23 08/22/23  Fasten bra  5 8  9   Hair (wash)  0 8 9  Wash back 5 8 8   Tie shoes 5 9 10   Reach into cabinets 4 7 9   Total 19 40 45  Avg  3.8 8 9    Total score = sum of the activity scores/number of activities Minimum detectable change (90%CI) for average score = 2 points Minimum detectable change (90%CI) for single activity score = 3 points Score: 19  COGNITION: Overall cognitive status: Within functional limits for tasks assessed     SENSATION: Denies sensory complaints  POSTURE: Mild guarding RUE  UPPER EXTREMITY ROM:  A/PROM Right eval Left eval Right 06/28/23 Right 07/06/23 Right 07/14/23 Right 08/02/23 Right 08/22/23  Shoulder flexion A: 109 deg  162 A: 120 AA: 146  A: 143 deg  A: 150 deg painful at beginning of arc A: 145 deg  A: 163 deg  Shoulder abduction A: 128 deg    A: 152 painful at beginning of arc A: 130 deg A: 154 deg  Shoulder internal rotation          Shoulder external rotation          Elbow flexion         Elbow extension         Wrist flexion         Wrist extension          (Blank rows = not tested) (Key: WFL = within functional limits not formally assessed, * = concordant pain, s = stiffness/stretching sensation, NT = not tested)  Comments: PROM testing limited by muscle guarding  UPPER EXTREMITY MMT:  MMT Right 08/02/23 Left 08/02/23 R/L 08/22/23  Shoulder flexion 4 5 4+/5  Shoulder extension      Shoulder abduction 4 4+ 4+/5  Shoulder extension     Shoulder internal rotation 4 5 4+/5  Shoulder external rotation 4+ 5 4+/5  Elbow flexion     Elbow extension     Grip strength 40# 65#   (Blank rows = not tested)  (Key: WFL =  within functional limits not formally assessed, * = concordant pain, s = stiffness/stretching sensation, NT = not tested)  Comments: deferred given proximity to surgery and pain w/ active mobility  PALPATION/OBSERVATION:  3 portal incisions all well appearing - no redness, swelling, drainage  FUNCTIONAL TESTING: 08/22/23:  - 2# unilateral reach, waist<>cabinet (~ eye level) 10 repetitions low RPE  - 3# unilateral reach, waist<>cabinet) 15 repetitions with moderate/hard RPE, no pain                                                                                                                              TREATMENT DATE:   [facility] Adult PT Treatment:                                                DATE: 08/24/23  Neuromuscular re-ed: Modified plank at counter 3x30 sec cues for position Mod plank shoulder taps x10 at counter BIL Green band row x10 w 5 sec hold  ER YTB iso walkouts 2x5  Therapeutic Activity: Pilates springboard hook zig zags 8-10 8 laps total, rest breaks as needed working on reaching endurance shoulder-head height Supine dowel shoulder flexion 2x12 for reaching mobility    [facility] Adult PT Treatment:                                                DATE: 08/22/23  Neuromuscular re-ed: D1 PNF x8 RUE, manually resisted based on pt pain free tolerance 5# BIL shrug x15 emphasis on eccentric portion Green band row 2x8 cues for reduced UT compensations  Therapeutic Activity: UE ranger flexion>abd arc 2x8 RUE 2# unilat waist<>cabinet reach x10 3# unilat waist<>cabinet reach x15  4# BIL waist<>cabinet reach x10 Education/discussion re: progress with PT, symptom behavior as it affects activity tolerance, PT goals/POC  Time spent discussing work  setup, activity tolerance, strategies to simulate work movements in PT sessions     [facility] Adult PT Treatment:                                                DATE: 08/17/2023  Therapeutic Exercise: Wall slide 2 x 10 flexion  Wall slide 2 x 10 abduction  YTB shoulder flexion AROM (band at wrists) 2x10 cues for posture Seated shrugs 4lb dumbbell BIL 2 x 10  Standing body blade 2 x 15 seconds each, verbal and visual cueing needed 90 deg shoulder flexion, anterior "punches"  0 deg shoulder flexion, inferior "punches"  90 deg shoulder flexion, inferior  90 deg abduction, lateral  UE ranger flexion 2  x10 @ 38 UE ranger scaption 2 x10 @ 38 UE ranger lateral reach 2 x 10   PATIENT EDUCATION: Education details: rationale for interventions, HEP  Person educated: Patient Education method: Explanation, Demonstration, Tactile cues, Verbal cues Education comprehension: verbalized understanding, returned demonstration, verbal cues required, tactile cues required, and needs further education     HOME EXERCISE PROGRAM: Access Code: [email or web address] URL: [email or web address] Date: 08/24/2023 Prepared by: Mayme Spearman  Program Notes -please do scaption w/ yellow band as done in clinic  Exercises - Standing Single Arm Elbow Flexion with Resistance  - 2-3 x daily - 1 sets - 8 reps - Standing Shoulder Row with Anchored Resistance  - 3-4 x weekly - 2 sets - 8 reps - Standing Shoulder Scaption  - 3-4 x weekly - 2 sets - 8 reps - Full Plank on Counter, Opposite Shoulder Taps  - 3-4 x weekly - 2 sets - 8 reps  ASSESSMENT:  CLINICAL IMPRESSION: 08/24/2023 Pt arrives w/o pain, continues to report improvement. Today we progress for increased emphasis on functional endurance in open and closed chain - pt endorses fatigue but no pain with this. Also progressing periscapular/RC stability training with good response. No adverse events, departs with fatigue but denies pain. Recommend continuing along  current POC in order to address relevant deficits and improve functional tolerance. Pt departs today's session in no acute distress, all voiced questions/concerns addressed appropriately from PT perspective.    OBJECTIVE IMPAIRMENTS: decreased activity tolerance, decreased endurance, decreased mobility, decreased ROM, decreased strength, impaired perceived functional ability, impaired UE functional use, and pain.   ACTIVITY LIMITATIONS: carrying, lifting, bathing, dressing, reach over head, and hygiene/grooming  PARTICIPATION LIMITATIONS: meal prep, cleaning, laundry, community activity, and occupation  PERSONAL FACTORS: Time since onset of injury/illness/exacerbation and 1-2 comorbidities: DM, HTN are also affecting patient's functional outcome.   REHAB POTENTIAL: Good  CLINICAL DECISION MAKING: Stable/uncomplicated  EVALUATION COMPLEXITY: Low   GOALS:  SHORT TERM GOALS: Target date: 07/13/2023  Pt will demonstrate appropriate understanding and performance of initially prescribed HEP in order to facilitate improved independence with management of symptoms.  Baseline: HEP established  07/12/23: reports good HEP adherence Goal status: MET  2. Pt will report at least 25% improvement in overall pain levels over past week in order to facilitate improved tolerance to typical daily activities.   Baseline: 0-9/10 07/12/23: 0-7/10 08/02/23: 0-6/10 08/22/23: 0-4/10 Goal status: MET  LONG TERM GOALS: Target date: 09/19/2023  last updated 08/22/23   Pt will score greater than 28 on PSFS in order to facilitate improved perception of function.   Baseline: 19   08/02/23: 40  Goal status: MET  2.  Pt will demonstrate at least 150 degrees of active R shoulder elevation in order to demonstrate improved tolerance to functional movement patterns such as reaching overhead and into cabinets.  Baseline: see ROM chart above Goal status: MET  3.  Pt will demonstrate at least 4+/5 shoulder flex/abd MMT for  improved symmetry of UE strength and improved tolerance to functional movements.  Baseline: NT on eval 08/02/23: see objective measures  08/22/23: see MMT chart  Goal status: MET  4. Pt will report at least 50% decrease in overall pain levels in past week in order to facilitate improved tolerance to basic ADLs/mobility.   Baseline: 0-9/10  08/02/23: 0-6/10  08/22/23: 0-4/10  Goal status: MET  5. Pt will report ability to perform upper and lower body dressing with less than 3 pt increase in pain in order to  facilitate improved tolerance to ADLs.  Baseline: inc pain with LBD/UBD  08/22/23: does not endorse any issues  Goal status: MET  6. Pt will be able to move 5# from waist to shoulder height for 20 repetitions in order to indicate improved functional shoulder endurance for work tasks.  Baseline: avoiding lifting tasks 08/02/23: Goal updated from 5- 20 repetitions; unable to tolerate lifting to shoulder height at this time 08/22/23: able to lift up to 3# for 15 repetitions, limited by fatigue  Goal status: PROGRESSING  7. Patient will be able to perform resisted D1/D2 Extension patterns in order to improve strength for adjusting equipment at work with no more than 3/10 pain.   Baseline: unable to tolerate d/t pain and radicular symptoms  08/22/23: moderate resistance without pain, does endorse fatigue  Goal Status: MET    PLAN: (updated 08/22/23)  PT FREQUENCY: 2x/week  PT DURATION: 4 weeks  PLANNED INTERVENTIONS: 16109- PT Re-evaluation, 97110-Therapeutic exercises, 97530- Therapeutic activity, 97112- Neuromuscular re-education, 97535- Self Care, [id number]- Manual therapy, [id number]- Electrical stimulation (unattended), Patient/Family education, Balance training, Stair training, Taping, Dry Needling, Joint mobilization, Spinal mobilization, Scar mobilization, Cryotherapy, and Moist heat  PLAN FOR NEXT SESSION:  continue progressing R GH work within pt tolerance, with emphasis on muscular  strength/endurance around shoulder height. Work task simulations   Lovett Ruck PT, DPT 08/24/2023 5:07 PM

## 2023-08-30 ENCOUNTER — Ambulatory Visit: Payer: Self-pay | Attending: Internal Medicine | Admitting: Physical Therapy

## 2023-08-30 ENCOUNTER — Encounter: Payer: Self-pay | Admitting: Physical Therapy

## 2023-08-30 DIAGNOSIS — M25511 Pain in right shoulder: Secondary | ICD-10-CM | POA: Diagnosis present

## 2023-08-30 DIAGNOSIS — M6281 Muscle weakness (generalized): Secondary | ICD-10-CM | POA: Diagnosis present

## 2023-08-30 NOTE — Therapy (Signed)
 OUTPATIENT PHYSICAL THERAPY TREATMENT   Patient Name: Mackenzie Clark MRN: [id number] DOB:06/13/1965, 58 y.o., female Today's Date: 08/22/2023     END OF SESSION:  PT End of Session - 08/30/23 1530     Visit Number 19    Number of Visits 25    Date for PT Re-Evaluation 09/19/23    Authorization Type workers comp    PT Start Time 1530    PT Stop Time 1610    PT Time Calculation (min) 40 min    Activity Tolerance Patient tolerated treatment well                       Past Medical History:  Diagnosis Date   Allergy    Arthritis    r shoulder   Diabetes mellitus without complication (HCC)    diet and exercise controlled   Hypertension    Past Surgical History:  Procedure Laterality Date   DILATION AND CURETTAGE OF UTERUS  04/05/2004   16 week stillbirth   IR ANGIOGRAM PELVIS SELECTIVE OR SUPRASELECTIVE  08/31/2019   IR ANGIOGRAM SELECTIVE EACH ADDITIONAL VESSEL  08/31/2019   IR EMBO TUMOR ORGAN ISCHEMIA INFARCT INC GUIDE ROADMAPPING  08/31/2019   IR FLUORO GUIDED NEEDLE PLC ASPIRATION/INJECTION LOC  08/31/2019   IR RADIOLOGIST EVAL & MGMT  07/10/2019   IR RADIOLOGIST EVAL & MGMT  09/18/2019   IR US  GUIDE VASC ACCESS RIGHT  08/31/2019   right shoulder arthroscopy Right 06/02/2023   TONSILLECTOMY     and adnoids   Patient Active Problem List   Diagnosis Date Noted   Status post shoulder surgery 07/12/2023   History of hematuria 03/01/2023   Encounter for general adult medical examination w/o abnormal findings 11/01/2022   Aortic atherosclerosis (HCC) 11/01/2022   Anxiety 11/01/2022   Vitamin D  deficiency disease 11/01/2022   Irregular menses 11/01/2022   Sprain of right ankle 11/01/2022   Pure hypercholesterolemia 05/06/2022   Overweight with body mass index (BMI) of 29 to 29.9 in adult 01/22/2021   Dyslipidemia associated with type 2 diabetes mellitus (HCC) 01/22/2021   Fibroids 08/31/2019   Abnormal uterine bleeding due to intramural leiomyoma  08/31/2019   Abnormal uterine bleeding (AUB) 05/21/2019   Uterine leiomyoma 05/21/2019   Body mass index (BMI) of 30.0-30.9 in adult 09/03/2017   Type II diabetes mellitus, uncontrolled 09/03/2017   Obesity 09/03/2017   Hypertensive heart disease 03/05/2013   Surveillance of previously prescribed contraceptive pill 03/05/2013    PCP: Cleave Curling, MD  REFERRING PROVIDER: Sammye Cristal, MD  REFERRING DIAG: S/P RIGHT SHOULDER SAD , DCE , CUFF DEBRIDEMENT  THERAPY DIAG:  Right shoulder pain, unspecified chronicity  Muscle weakness (generalized)  Rationale for Evaluation and Treatment: Rehabilitation  ONSET DATE: R shoulder cuff debridement, SAD, DCE DOS 06/02/23  Next MD appt: Next month per pt report  SUBJECTIVE:  SUBJECTIVE STATEMENT: 08/30/2023 Pt states evening after last session she was very sore, improving with time but still a bit sore today. Gestures to deltoid as site of soreness. No numbness. 3/10 with movement today. She does note she has been working on some of her own banded exercises as well.  Hand dominance: Left  PERTINENT HISTORY: HTN, DM2  PAIN:  Are you having pain: 0/10  at rest, 3/10 with movement  Worst since last visit: 4/10 transiently (updated 08/22/23)  Per eval -  Location/description: R shoulder Best-worst over past week: 0-9/10  - aggravating factors: upper body dressing, tying shoes, washing back, doing hair, sleeping - Easing factors: brace, ice pack     PRECAUTIONS: post op R shoulder ; per paper referral from physician okay for PROM/AAROM/AROM, strengthening   RED FLAGS: None   WEIGHT BEARING RESTRICTIONS: No  FALLS:  Has patient fallen in last 6 months? No  LIVING ENVIRONMENT: 2 story home, no issues with stairs, lives with son . 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  DIAGNOSTIC FINDINGS:  DOS 06/02/23 R shoulder SAD, DCE, cuff debridement  PATIENT SURVEYS:  Patient-specific activity scoring scheme (Point to one number):  "0" represents "unable to perform." "10" represents "able to perform at prior level. 0 1 2 3 4 5 6 7 8 9  10 (Date and Score) Activity Initial  Activity Eval   08/02/23 08/22/23  Fasten bra  5 8  9   Hair (wash)  0 8 9  Wash back 5 8 8   Tie shoes 5 9 10   Reach into cabinets 4 7 9   Total 19 40 45  Avg  3.8 8 9    Total score = sum of the activity scores/number of activities Minimum detectable change (90%CI) for average score = 2 points Minimum detectable change (90%CI) for single activity score = 3 points Score: 19  COGNITION: Overall cognitive status: Within functional limits for tasks assessed     SENSATION: Denies sensory complaints  POSTURE: Mild guarding RUE  UPPER EXTREMITY ROM:  A/PROM Right eval Left eval Right 06/28/23 Right 07/06/23 Right 07/14/23 Right 08/02/23 Right 08/22/23  Shoulder flexion A: 109 deg  162 A: 120 AA: 146  A: 143 deg  A: 150 deg painful at beginning of arc A: 145 deg  A: 163 deg  Shoulder abduction A: 128 deg    A: 152 painful at beginning of arc A: 130 deg A: 154 deg  Shoulder internal rotation          Shoulder external rotation          Elbow flexion         Elbow extension         Wrist flexion         Wrist extension          (Blank rows = not tested) (Key: WFL = within functional limits not formally assessed, * = concordant pain, s = stiffness/stretching sensation, NT = not tested)  Comments: PROM testing limited by muscle guarding  UPPER EXTREMITY MMT:  MMT Right 08/02/23  Left 08/02/23 R/L 08/22/23  Shoulder flexion 4 5 4+/5  Shoulder extension     Shoulder abduction 4 4+ 4+/5  Shoulder extension  Shoulder internal rotation 4 5 4+/5  Shoulder external rotation 4+ 5 4+/5  Elbow flexion     Elbow extension     Grip strength 40# 65#   (Blank rows = not tested)  (Key: WFL = within functional limits not formally assessed, * = concordant pain, s = stiffness/stretching sensation, NT = not tested)  Comments: deferred given proximity to surgery and pain w/ active mobility  PALPATION/OBSERVATION:  3 portal incisions all well appearing - no redness, swelling, drainage  FUNCTIONAL TESTING: 08/22/23:  - 2# unilateral reach, waist<>cabinet (~ eye level) 10 repetitions low RPE  - 3# unilateral reach, waist<>cabinet) 15 repetitions with moderate/hard RPE, no pain                                                                                                                              TREATMENT DATE:   [facility] Adult PT Treatment:                                                DATE: 08/30/23 Therapeutic Exercise: Supine blue band bicep curl 2x12 towel prop to reduce deltoid compensations PT resisted scapular retraction isometric 2x8  HEP education/discussion  Neuromuscular re-ed: Modified plank shoulder taps at counter 2x5 cues for pacing  Green band row 2x5 w 5 sec hold  Therapeutic Activity: UE ranger flexion/abduction arcs 2x8  Pilates springboard hook zig zags 6-9 4 laps total   [facility] Adult PT Treatment:                                                DATE: 08/24/23  Neuromuscular re-ed: Modified plank at counter 3x30 sec cues for position Mod plank shoulder taps x10 at counter BIL Green band row x10 w 5 sec hold  ER YTB iso walkouts 2x5  Therapeutic Activity: Pilates springboard hook zig zags 8-10 8 laps total, rest breaks as needed working on reaching endurance shoulder-head height Supine dowel shoulder flexion 2x12 for reaching mobility    [facility]  Adult PT Treatment:                                                DATE: 08/22/23  Neuromuscular re-ed: D1 PNF x8 RUE, manually resisted based on pt pain free tolerance 5# BIL shrug x15 emphasis on eccentric portion Green band row 2x8 cues for reduced UT compensations  Therapeutic Activity: UE ranger flexion>abd arc 2x8 RUE 2# unilat waist<>cabinet reach x10 3# unilat waist<>cabinet reach x15  4# BIL waist<>cabinet reach x10 Education/discussion re: progress with PT, symptom behavior  as it affects activity tolerance, PT goals/POC  Time spent discussing work setup, activity tolerance, strategies to simulate work movements in PT sessions     [facility] Adult PT Treatment:                                                DATE: 08/17/2023  Therapeutic Exercise: Wall slide 2 x 10 flexion  Wall slide 2 x 10 abduction  YTB shoulder flexion AROM (band at wrists) 2x10 cues for posture Seated shrugs 4lb dumbbell BIL 2 x 10  Standing body blade 2 x 15 seconds each, verbal and visual cueing needed 90 deg shoulder flexion, anterior "punches"  0 deg shoulder flexion, inferior "punches"  90 deg shoulder flexion, inferior  90 deg abduction, lateral  UE ranger flexion 2 x10 @ 38 UE ranger scaption 2 x10 @ 38 UE ranger lateral reach 2 x 10   PATIENT EDUCATION: Education details: rationale for interventions, HEP  Person educated: Patient Education method: Explanation, Demonstration, Tactile cues, Verbal cues Education comprehension: verbalized understanding, returned demonstration, verbal cues required, tactile cues required, and needs further education     HOME EXERCISE PROGRAM: Access Code: [email or web address] URL: [email or web address] Date: 08/24/2023 Prepared by: Mayme Spearman  Program Notes -please do scaption w/ yellow band as done in clinic  Exercises - Standing Single Arm Elbow Flexion with Resistance  - 2-3 x daily - 1 sets - 8 reps - Standing Shoulder Row with Anchored Resistance   - 3-4 x weekly - 2 sets - 8 reps - Standing Shoulder Scaption  - 3-4 x weekly - 2 sets - 8 reps - Full Plank on Counter, Opposite Shoulder Taps  - 3-4 x weekly - 2 sets - 8 reps  ASSESSMENT:  CLINICAL IMPRESSION: 08/30/2023 Pt arrives w/ report of fairly significant deltoid soreness after last session. Given this we continue working on [facility] mobility, deltoid/RC endurance with reduced volume and increased rest breaks to improve tolerance. Of note, she does tend to endorse deltoid fatigue with exercises that are not specifically targeting deltoid, so we spend time working on modifications to mitigate this. HEP discussion accordingly. No adverse events, denies any change in pain on departure. Recommend continuing along current POC in order to address relevant deficits and improve functional tolerance. Pt departs today's session in no acute distress, all voiced questions/concerns addressed appropriately from PT perspective.      OBJECTIVE IMPAIRMENTS: decreased activity tolerance, decreased endurance, decreased mobility, decreased ROM, decreased strength, impaired perceived functional ability, impaired UE functional use, and pain.   ACTIVITY LIMITATIONS: carrying, lifting, bathing, dressing, reach over head, and hygiene/grooming  PARTICIPATION LIMITATIONS: meal prep, cleaning, laundry, community activity, and occupation  PERSONAL FACTORS: Time since onset of injury/illness/exacerbation and 1-2 comorbidities: DM, HTN are also affecting patient's functional outcome.   REHAB POTENTIAL: Good  CLINICAL DECISION MAKING: Stable/uncomplicated  EVALUATION COMPLEXITY: Low   GOALS:  SHORT TERM GOALS: Target date: 07/13/2023  Pt will demonstrate appropriate understanding and performance of initially prescribed HEP in order to facilitate improved independence with management of symptoms.  Baseline: HEP established  07/12/23: reports good HEP adherence Goal status: MET  2. Pt will report at least 25%  improvement in overall pain levels over past week in order to facilitate improved tolerance to typical daily activities.   Baseline: 0-9/10 07/12/23: 0-7/10 08/02/23: 0-6/10 08/22/23: 0-4/10 Goal status: MET  LONG  TERM GOALS: Target date: 09/19/2023  last updated 08/22/23   Pt will score greater than 28 on PSFS in order to facilitate improved perception of function.   Baseline: 19   08/02/23: 40  Goal status: MET  2.  Pt will demonstrate at least 150 degrees of active R shoulder elevation in order to demonstrate improved tolerance to functional movement patterns such as reaching overhead and into cabinets.  Baseline: see ROM chart above Goal status: MET  3.  Pt will demonstrate at least 4+/5 shoulder flex/abd MMT for improved symmetry of UE strength and improved tolerance to functional movements.  Baseline: NT on eval 08/02/23: see objective measures  08/22/23: see MMT chart  Goal status: MET  4. Pt will report at least 50% decrease in overall pain levels in past week in order to facilitate improved tolerance to basic ADLs/mobility.   Baseline: 0-9/10  08/02/23: 0-6/10  08/22/23: 0-4/10  Goal status: MET  5. Pt will report ability to perform upper and lower body dressing with less than 3 pt increase in pain in order to facilitate improved tolerance to ADLs.  Baseline: inc pain with LBD/UBD  08/22/23: does not endorse any issues  Goal status: MET  6. Pt will be able to move 5# from waist to shoulder height for 20 repetitions in order to indicate improved functional shoulder endurance for work tasks.  Baseline: avoiding lifting tasks 08/02/23: Goal updated from 5- 20 repetitions; unable to tolerate lifting to shoulder height at this time 08/22/23: able to lift up to 3# for 15 repetitions, limited by fatigue  Goal status: PROGRESSING  7. Patient will be able to perform resisted D1/D2 Extension patterns in order to improve strength for adjusting equipment at work with no more than 3/10 pain.    Baseline: unable to tolerate d/t pain and radicular symptoms  08/22/23: moderate resistance without pain, does endorse fatigue  Goal Status: MET    PLAN: (updated 08/22/23)  PT FREQUENCY: 2x/week  PT DURATION: 4 weeks  PLANNED INTERVENTIONS: 40981- PT Re-evaluation, 97110-Therapeutic exercises, 97530- Therapeutic activity, 97112- Neuromuscular re-education, 97535- Self Care, [id number]- Manual therapy, [id number]- Electrical stimulation (unattended), Patient/Family education, Balance training, Stair training, Taping, Dry Needling, Joint mobilization, Spinal mobilization, Scar mobilization, Cryotherapy, and Moist heat  PLAN FOR NEXT SESSION:  continue progressing R GH work within pt tolerance, with emphasis on muscular strength/endurance around shoulder height. Work [employer]. Try to mitigate deltoid compensations.   Lovett Ruck PT, DPT 08/30/2023 5:09 PM

## 2023-09-01 ENCOUNTER — Other Ambulatory Visit: Payer: Self-pay | Admitting: Internal Medicine

## 2023-09-01 ENCOUNTER — Other Ambulatory Visit (HOSPITAL_BASED_OUTPATIENT_CLINIC_OR_DEPARTMENT_OTHER): Payer: Self-pay

## 2023-09-01 DIAGNOSIS — I119 Hypertensive heart disease without heart failure: Secondary | ICD-10-CM

## 2023-09-05 ENCOUNTER — Other Ambulatory Visit (HOSPITAL_BASED_OUTPATIENT_CLINIC_OR_DEPARTMENT_OTHER): Payer: Self-pay

## 2023-09-05 MED ORDER — OLMESARTAN MEDOXOMIL-HCTZ 40-25 MG PO TABS
1.0000 | ORAL_TABLET | Freq: Every day | ORAL | 2 refills | Status: DC
Start: 2023-09-05 — End: 2023-11-21
  Filled 2023-09-05: qty 90, 90d supply, fill #0

## 2023-09-07 ENCOUNTER — Ambulatory Visit: Payer: Self-pay | Attending: Internal Medicine

## 2023-09-07 DIAGNOSIS — M6281 Muscle weakness (generalized): Secondary | ICD-10-CM | POA: Diagnosis present

## 2023-09-07 DIAGNOSIS — M25511 Pain in right shoulder: Secondary | ICD-10-CM | POA: Insufficient documentation

## 2023-09-07 NOTE — Therapy (Signed)
 OUTPATIENT PHYSICAL THERAPY TREATMENT   Patient Name: Mackenzie Clark MRN: [id number] DOB:Oct 30, 1965, 58 y.o., female Today's Date: 08/22/2023     END OF SESSION:  PT End of Session - 09/07/23 1630     Visit Number 20    Number of Visits 25    Date for PT Re-Evaluation 09/19/23    Authorization Type workers comp    Authorization Time Period 4/15 12 visits through 5/21 (see media tab)    Authorization - Visit Number 10    Authorization - Number of Visits 12    PT Start Time 1620    PT Stop Time 1658    PT Time Calculation (min) 38 min    Activity Tolerance Patient tolerated treatment well    Behavior During Therapy WFL for tasks assessed/performed                 Past Medical History:  Diagnosis Date   Allergy    Arthritis    r shoulder   Diabetes mellitus without complication (HCC)    diet and exercise controlled   Hypertension    Past Surgical History:  Procedure Laterality Date   DILATION AND CURETTAGE OF UTERUS  04/05/2004   16 week stillbirth   IR ANGIOGRAM PELVIS SELECTIVE OR SUPRASELECTIVE  08/31/2019   IR ANGIOGRAM SELECTIVE EACH ADDITIONAL VESSEL  08/31/2019   IR EMBO TUMOR ORGAN ISCHEMIA INFARCT INC GUIDE ROADMAPPING  08/31/2019   IR FLUORO GUIDED NEEDLE PLC ASPIRATION/INJECTION LOC  08/31/2019   IR RADIOLOGIST EVAL & MGMT  07/10/2019   IR RADIOLOGIST EVAL & MGMT  09/18/2019   IR US  GUIDE VASC ACCESS RIGHT  08/31/2019   right shoulder arthroscopy Right 06/02/2023   TONSILLECTOMY     and adnoids   Patient Active Problem List   Diagnosis Date Noted   Status post shoulder surgery 07/12/2023   History of hematuria 03/01/2023   Encounter for general adult medical examination w/o abnormal findings 11/01/2022   Aortic atherosclerosis (HCC) 11/01/2022   Anxiety 11/01/2022   Vitamin D  deficiency disease 11/01/2022   Irregular menses 11/01/2022   Sprain of right ankle 11/01/2022   Pure hypercholesterolemia 05/06/2022   Overweight with body mass  index (BMI) of 29 to 29.9 in adult 01/22/2021   Dyslipidemia associated with type 2 diabetes mellitus (HCC) 01/22/2021   Fibroids 08/31/2019   Abnormal uterine bleeding due to intramural leiomyoma 08/31/2019   Abnormal uterine bleeding (AUB) 05/21/2019   Uterine leiomyoma 05/21/2019   Body mass index (BMI) of 30.0-30.9 in adult 09/03/2017   Type II diabetes mellitus, uncontrolled 09/03/2017   Obesity 09/03/2017   Hypertensive heart disease 03/05/2013   Surveillance of previously prescribed contraceptive pill 03/05/2013    PCP: Cleave Curling, MD  REFERRING PROVIDER: Sammye Cristal, MD  REFERRING DIAG: S/P RIGHT SHOULDER SAD , DCE , CUFF DEBRIDEMENT  THERAPY DIAG:  Right shoulder pain, unspecified chronicity  Muscle weakness (generalized)  Acute pain of right shoulder  Rationale for Evaluation and Treatment: Rehabilitation  ONSET DATE: R shoulder cuff debridement, SAD, DCE DOS 06/02/23  Next MD appt: Next month per pt report  SUBJECTIVE:  SUBJECTIVE STATEMENT: 09/07/2023 Pt states  that she had some nerve pain following last visit, that resolved quickly with use of nerve gliding activity.   Hand dominance: Left  PERTINENT HISTORY: HTN, DM2  PAIN:  Are you having pain: 0/10  at rest, 3/10 with movement  Worst since last visit: 4/10 transiently (updated 08/22/23)  Per eval -  Location/description: R shoulder Best-worst over past week: 0-9/10  - aggravating factors: upper body dressing, tying shoes, washing back, doing hair, sleeping - Easing factors: brace, ice pack     PRECAUTIONS: post op R shoulder ; per paper referral from physician okay for PROM/AAROM/AROM, strengthening   RED FLAGS: None   WEIGHT BEARING RESTRICTIONS: No  FALLS:  Has patient fallen in last 6 months? No  LIVING  ENVIRONMENT: 2 story home, no issues with stairs, lives with son . 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  DIAGNOSTIC FINDINGS:  DOS 06/02/23 R shoulder SAD, DCE, cuff debridement  PATIENT SURVEYS:  Patient-specific activity scoring scheme (Point to one number):  "0" represents "unable to perform." "10" represents "able to perform at prior level. 0 1 2 3 4 5 6 7 8 9  10 (Date and Score) Activity Initial  Activity Eval   08/02/23 08/22/23  Fasten bra  5 8  9   Hair (wash)  0 8 9  Wash back 5 8 8   Tie shoes 5 9 10   Reach into cabinets 4 7 9   Total 19 40 45  Avg  3.8 8 9    Total score = sum of the activity scores/number of activities Minimum detectable change (90%CI) for average score = 2 points Minimum detectable change (90%CI) for single activity score = 3 points Score: 19  COGNITION: Overall cognitive status: Within functional limits for tasks assessed     SENSATION: Denies sensory complaints  POSTURE: Mild guarding RUE  UPPER EXTREMITY ROM:  A/PROM Right eval Left eval Right 06/28/23 Right 07/06/23 Right 07/14/23 Right 08/02/23 Right 08/22/23  Shoulder flexion A: 109 deg  162 A: 120 AA: 146  A: 143 deg  A: 150 deg painful at beginning of arc A: 145 deg  A: 163 deg  Shoulder abduction A: 128 deg    A: 152 painful at beginning of arc A: 130 deg A: 154 deg  Shoulder internal rotation          Shoulder external rotation          Elbow flexion         Elbow extension         Wrist flexion         Wrist extension          (Blank rows = not tested) (Key: WFL = within functional limits not formally assessed, * = concordant pain, s = stiffness/stretching sensation, NT = not tested)  Comments: PROM  testing limited by muscle guarding  UPPER EXTREMITY MMT:  MMT Right 08/02/23 Left 08/02/23 R/L 08/22/23  Shoulder flexion 4 5 4+/5  Shoulder extension     Shoulder abduction 4 4+ 4+/5  Shoulder extension     Shoulder internal rotation 4 5 4+/5  Shoulder external rotation 4+ 5 4+/5  Elbow flexion     Elbow extension  Grip strength 40# 65#   (Blank rows = not tested)  (Key: WFL = within functional limits not formally assessed, * = concordant pain, s = stiffness/stretching sensation, NT = not tested)  Comments: deferred given proximity to surgery and pain w/ active mobility  PALPATION/OBSERVATION:  3 portal incisions all well appearing - no redness, swelling, drainage  FUNCTIONAL TESTING: 08/22/23:  - 2# unilateral reach, waist<>cabinet (~ eye level) 10 repetitions low RPE  - 3# unilateral reach, waist<>cabinet) 15 repetitions with moderate/hard RPE, no pain                                                                                                                              TREATMENT DATE:   [facility] Adult PT Treatment:                                                DATE: 09/07/2023   Neuromuscular re-ed: Modified plank shoulder taps at counter 2x5 cues for pacing  Green band row 3x5 w 5 sec hold ER to neutral with green band 2 x 10  Supine blue band bicep curl 2x12 cueing to reduce deltoid compensations Digi-flex (5.0lb) resisted finger flexion x 2 minutes with breaks as needed  Therapeutic Activity: UE ranger flexion/abduction arcs 2x8  Pilates springboard hook zig zags 6-9 4 laps total  [facility] Adult PT Treatment:                                                DATE: 08/30/23 Therapeutic Exercise: Supine blue band bicep curl 2x12 towel prop to reduce deltoid compensations PT resisted scapular retraction isometric 2x8  HEP education/discussion  Neuromuscular re-ed: Modified plank shoulder taps at counter 2x5 cues for pacing  Green band row 2x5 w 5 sec  hold  Therapeutic Activity: UE ranger flexion/abduction arcs 2x8  Pilates springboard hook zig zags 6-9 4 laps total   [facility] Adult PT Treatment:                                                DATE: 08/24/23  Neuromuscular re-ed: Modified plank at counter 3x30 sec cues for position Mod plank shoulder taps x10 at counter BIL Green band row x10 w 5 sec hold  ER YTB iso walkouts 2x5  Therapeutic Activity: Pilates springboard hook zig zags 8-10 8 laps total, rest breaks as needed working on reaching endurance shoulder-head height Supine dowel shoulder flexion 2x12 for reaching mobility    [facility] Adult PT Treatment:  DATE: 08/22/23  Neuromuscular re-ed: D1 PNF x8 RUE, manually resisted based on pt pain free tolerance 5# BIL shrug x15 emphasis on eccentric portion Green band row 2x8 cues for reduced UT compensations  Therapeutic Activity: UE ranger flexion>abd arc 2x8 RUE 2# unilat waist<>cabinet reach x10 3# unilat waist<>cabinet reach x15  4# BIL waist<>cabinet reach x10 Education/discussion re: progress with PT, symptom behavior as it affects activity tolerance, PT goals/POC  Time spent discussing work setup, activity tolerance, strategies to simulate work movements in PT sessions      PATIENT EDUCATION: Education details: rationale for interventions, HEP  Person educated: Patient Education method: Programmer, multimedia, Facilities manager, Actor cues, Verbal cues Education comprehension: verbalized understanding, returned demonstration, verbal cues required, tactile cues required, and needs further education     HOME EXERCISE PROGRAM: Access Code: [email or web address] URL: [email or web address] Date: 08/24/2023 Prepared by: Mayme Spearman  Program Notes -please do scaption w/ yellow band as done in clinic  Exercises - Standing Single Arm Elbow Flexion with Resistance  - 2-3 x daily - 1 sets - 8 reps - Standing Shoulder Row with  Anchored Resistance  - 3-4 x weekly - 2 sets - 8 reps - Standing Shoulder Scaption  - 3-4 x weekly - 2 sets - 8 reps - Full Plank on Counter, Opposite Shoulder Taps  - 3-4 x weekly - 2 sets - 8 reps  ASSESSMENT:  CLINICAL IMPRESSION: 09/07/2023 Mackenzie Clark had good tolerance of today's treatment session, which focused on UQ mm endurance and strengthening. Time spent improved mechanics to decrease deltoid compensations. Initiated finger flexion strengthening d/t increased nerve pain with fine motor component of pilates springboard activity. We will continue to progress per POC as tolerated, in order to reach established rehab goals.     OBJECTIVE IMPAIRMENTS: decreased activity tolerance, decreased endurance, decreased mobility, decreased ROM, decreased strength, impaired perceived functional ability, impaired UE functional use, and pain.   ACTIVITY LIMITATIONS: carrying, lifting, bathing, dressing, reach over head, and hygiene/grooming  PARTICIPATION LIMITATIONS: meal prep, cleaning, laundry, community activity, and occupation  PERSONAL FACTORS: Time since onset of injury/illness/exacerbation and 1-2 comorbidities: DM, HTN are also affecting patient's functional outcome.   REHAB POTENTIAL: Good  CLINICAL DECISION MAKING: Stable/uncomplicated  EVALUATION COMPLEXITY: Low   GOALS:  SHORT TERM GOALS: Target date: 07/13/2023  Pt will demonstrate appropriate understanding and performance of initially prescribed HEP in order to facilitate improved independence with management of symptoms.  Baseline: HEP established  07/12/23: reports good HEP adherence Goal status: MET  2. Pt will report at least 25% improvement in overall pain levels over past week in order to facilitate improved tolerance to typical daily activities.   Baseline: 0-9/10 07/12/23: 0-7/10 08/02/23: 0-6/10 08/22/23: 0-4/10 Goal status: MET  LONG TERM GOALS: Target date: 09/19/2023  last updated 08/22/23   Pt will score greater  than 28 on PSFS in order to facilitate improved perception of function.   Baseline: 19   08/02/23: 40  Goal status: MET  2.  Pt will demonstrate at least 150 degrees of active R shoulder elevation in order to demonstrate improved tolerance to functional movement patterns such as reaching overhead and into cabinets.  Baseline: see ROM chart above Goal status: MET  3.  Pt will demonstrate at least 4+/5 shoulder flex/abd MMT for improved symmetry of UE strength and improved tolerance to functional movements.  Baseline: NT on eval 08/02/23: see objective measures  08/22/23: see MMT chart  Goal status: MET  4. Pt will report at least 50% decrease in  overall pain levels in past week in order to facilitate improved tolerance to basic ADLs/mobility.   Baseline: 0-9/10  08/02/23: 0-6/10  08/22/23: 0-4/10  Goal status: MET  5. Pt will report ability to perform upper and lower body dressing with less than 3 pt increase in pain in order to facilitate improved tolerance to ADLs.  Baseline: inc pain with LBD/UBD  08/22/23: does not endorse any issues  Goal status: MET  6. Pt will be able to move 5# from waist to shoulder height for 20 repetitions in order to indicate improved functional shoulder endurance for work tasks.  Baseline: avoiding lifting tasks 08/02/23: Goal updated from 5- 20 repetitions; unable to tolerate lifting to shoulder height at this time 08/22/23: able to lift up to 3# for 15 repetitions, limited by fatigue  Goal status: PROGRESSING  7. Patient will be able to perform resisted D1/D2 Extension patterns in order to improve strength for adjusting equipment at work with no more than 3/10 pain.   Baseline: unable to tolerate d/t pain and radicular symptoms  08/22/23: moderate resistance without pain, does endorse fatigue  Goal Status: MET    PLAN: (updated 08/22/23)  PT FREQUENCY: 2x/week  PT DURATION: 4 weeks  PLANNED INTERVENTIONS: 86578- PT Re-evaluation, 97110-Therapeutic  exercises, 97530- Therapeutic activity, 97112- Neuromuscular re-education, 97535- Self Care, [id number]- Manual therapy, [id number]- Electrical stimulation (unattended), Patient/Family education, Balance training, Stair training, Taping, Dry Needling, Joint mobilization, Spinal mobilization, Scar mobilization, Cryotherapy, and Moist heat  PLAN FOR NEXT SESSION:  continue progressing R GH work within pt tolerance, with emphasis on muscular strength/endurance around shoulder height. Work Hotel manager. Try to mitigate deltoid compensations.   Arlester Bence, PT, DPT  09/07/2023 10:04 PM

## 2023-09-08 ENCOUNTER — Ambulatory Visit: Payer: Self-pay | Admitting: Physical Therapy

## 2023-09-08 ENCOUNTER — Encounter: Payer: Self-pay | Admitting: Physical Therapy

## 2023-09-08 DIAGNOSIS — M25511 Pain in right shoulder: Secondary | ICD-10-CM

## 2023-09-08 DIAGNOSIS — M6281 Muscle weakness (generalized): Secondary | ICD-10-CM

## 2023-09-08 NOTE — Therapy (Signed)
 OUTPATIENT PHYSICAL THERAPY TREATMENT   Patient Name: Mackenzie Clark MRN: [id number] DOB:27-Sep-1965, 58 y.o., female Today's Date: 08/22/2023     END OF SESSION:  PT End of Session - 09/08/23 1619     Visit Number 21    Number of Visits 25    Date for PT Re-Evaluation 09/19/23    Authorization Type workers comp    PT Start Time 1619    PT Stop Time 1700    PT Time Calculation (min) 41 min    Activity Tolerance Patient tolerated treatment well              Past Medical History:  Diagnosis Date   Allergy    Arthritis    r shoulder   Diabetes mellitus without complication (HCC)    diet and exercise controlled   Hypertension    Past Surgical History:  Procedure Laterality Date   DILATION AND CURETTAGE OF UTERUS  04/05/2004   16 week stillbirth   IR ANGIOGRAM PELVIS SELECTIVE OR SUPRASELECTIVE  08/31/2019   IR ANGIOGRAM SELECTIVE EACH ADDITIONAL VESSEL  08/31/2019   IR EMBO TUMOR ORGAN ISCHEMIA INFARCT INC GUIDE ROADMAPPING  08/31/2019   IR FLUORO GUIDED NEEDLE PLC ASPIRATION/INJECTION LOC  08/31/2019   IR RADIOLOGIST EVAL & MGMT  07/10/2019   IR RADIOLOGIST EVAL & MGMT  09/18/2019   IR US  GUIDE VASC ACCESS RIGHT  08/31/2019   right shoulder arthroscopy Right 06/02/2023   TONSILLECTOMY     and adnoids   Patient Active Problem List   Diagnosis Date Noted   Status post shoulder surgery 07/12/2023   History of hematuria 03/01/2023   Encounter for general adult medical examination w/o abnormal findings 11/01/2022   Aortic atherosclerosis (HCC) 11/01/2022   Anxiety 11/01/2022   Vitamin D  deficiency disease 11/01/2022   Irregular menses 11/01/2022   Sprain of right ankle 11/01/2022   Pure hypercholesterolemia 05/06/2022   Overweight with body mass index (BMI) of 29 to 29.9 in adult 01/22/2021   Dyslipidemia associated with type 2 diabetes mellitus (HCC) 01/22/2021   Fibroids 08/31/2019   Abnormal uterine bleeding due to intramural leiomyoma 08/31/2019    Abnormal uterine bleeding (AUB) 05/21/2019   Uterine leiomyoma 05/21/2019   Body mass index (BMI) of 30.0-30.9 in adult 09/03/2017   Type II diabetes mellitus, uncontrolled 09/03/2017   Obesity 09/03/2017   Hypertensive heart disease 03/05/2013   Surveillance of previously prescribed contraceptive pill 03/05/2013    PCP: Cleave Curling, MD  REFERRING PROVIDER: Sammye Cristal, MD  REFERRING DIAG: S/P RIGHT SHOULDER SAD , DCE , CUFF DEBRIDEMENT  THERAPY DIAG:  Right shoulder pain, unspecified chronicity  Muscle weakness (generalized)  Rationale for Evaluation and Treatment: Rehabilitation  ONSET DATE: R shoulder cuff debridement, SAD, DCE DOS 06/02/23  Next MD appt: Next month per pt report  SUBJECTIVE:  SUBJECTIVE STATEMENT: 09/08/2023 Pt states she had a bit of pain in deltoid last night, mild soreness persisting today but improved. About of numbness as well resolved w/ nerve glides.    Hand dominance: Left  PERTINENT HISTORY: HTN, DM2  PAIN:  Are you having pain: 0/10  at rest, mild soreness nonpainful w/ movement  Worst since last visit: 3/10 transiently (updated 09/08/23)  Per eval -  Location/description: R shoulder Best-worst over past week: 0-9/10  - aggravating factors: upper body dressing, tying shoes, washing back, doing hair, sleeping - Easing factors: brace, ice pack     PRECAUTIONS: post op R shoulder ; per paper referral from physician okay for PROM/AAROM/AROM, strengthening   RED FLAGS: None   WEIGHT BEARING RESTRICTIONS: No  FALLS:  Has patient fallen in last 6 months? No  LIVING ENVIRONMENT: 2 story home, no issues with stairs, lives with son . 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  DIAGNOSTIC FINDINGS:  DOS 06/02/23 R shoulder SAD, DCE, cuff debridement  PATIENT SURVEYS:  Patient-specific activity scoring scheme (Point to one number):  "0" represents "unable to perform." "10" represents "able to perform at prior level. 0 1 2 3 4 5 6 7 8 9  10 (Date and Score) Activity Initial  Activity Eval   08/02/23 08/22/23  Fasten bra  5 8  9   Hair (wash)  0 8 9  Wash back 5 8 8   Tie shoes 5 9 10   Reach into cabinets 4 7 9   Total 19 40 45  Avg  3.8 8 9    Total score = sum of the activity scores/number of activities Minimum detectable change (90%CI) for average score = 2 points Minimum detectable change (90%CI) for single activity score = 3 points Score: 19  COGNITION: Overall cognitive status: Within functional limits for tasks assessed     SENSATION: Denies sensory complaints  POSTURE: Mild guarding RUE  UPPER EXTREMITY ROM:  A/PROM Right eval Left eval Right 06/28/23 Right 07/06/23 Right 07/14/23 Right 08/02/23 Right 08/22/23  Shoulder flexion A: 109 deg  162 A: 120 AA: 146  A: 143 deg  A: 150 deg painful at beginning of arc A: 145 deg  A: 163 deg  Shoulder abduction A: 128 deg    A: 152 painful at beginning of arc A: 130 deg A: 154 deg  Shoulder internal rotation          Shoulder external rotation          Elbow flexion         Elbow extension         Wrist flexion         Wrist extension          (Blank rows = not tested) (Key: WFL = within functional limits not formally assessed, * = concordant pain, s = stiffness/stretching sensation, NT = not tested)  Comments: PROM testing limited by muscle guarding  UPPER EXTREMITY MMT:  MMT Right 08/02/23 Left 08/02/23 R/L 08/22/23  Shoulder flexion 4 5 4+/5  Shoulder extension     Shoulder abduction 4 4+ 4+/5   Shoulder extension     Shoulder internal rotation 4 5 4+/5  Shoulder external rotation 4+ 5 4+/5  Elbow  flexion     Elbow extension     Grip strength 40# 65#   (Blank rows = not tested)  (Key: WFL = within functional limits not formally assessed, * = concordant pain, s = stiffness/stretching sensation, NT = not tested)  Comments: deferred given proximity to surgery and pain w/ active mobility  PALPATION/OBSERVATION:  3 portal incisions all well appearing - no redness, swelling, drainage  FUNCTIONAL TESTING: 08/22/23:  - 2# unilateral reach, waist<>cabinet (~ eye level) 10 repetitions low RPE  - 3# unilateral reach, waist<>cabinet) 15 repetitions with moderate/hard RPE, no pain                                                                                                                              TREATMENT DATE:   [facility] Adult PT Treatment:                                                DATE: 09/08/23 Neuromuscular Re-education: 1# DB flex/abd 2x10 of each  5# RUE bent row x8  Blue band row x10  Green band ER walkout 2x5  Ulnar nerve glides x10   Therapeutic Activity: Finger ladder abduction x8 (able to reach 28) Finger ladder flexion x8 (able to reach 28 )  Digi flex 5# opposition RUE x56min Yellow T bar sup/pron x5 each way     [facility] Adult PT Treatment:                                                DATE: 09/07/2023   Neuromuscular re-ed: Modified plank shoulder taps at counter 2x5 cues for pacing  Green band row 3x5 w 5 sec hold ER to neutral with green band 2 x 10  Supine blue band bicep curl 2x12 cueing to reduce deltoid compensations Digi-flex (5.0lb) resisted finger flexion x 2 minutes with breaks as needed  Therapeutic Activity: UE ranger flexion/abduction arcs 2x8  Pilates springboard hook zig zags 6-9 4 laps total  [facility] Adult PT Treatment:                                                DATE: 08/30/23 Therapeutic Exercise: Supine blue band bicep curl 2x12  towel prop to reduce deltoid compensations PT resisted scapular retraction isometric 2x8  HEP education/discussion  Neuromuscular re-ed: Modified plank shoulder taps at counter 2x5 cues for pacing  Green band row 2x5 w 5 sec hold  Therapeutic Activity: UE ranger flexion/abduction arcs 2x8  Pilates springboard hook zig zags 6-9 4 laps total   [facility] Adult  PT Treatment:                                                DATE: 08/24/23  Neuromuscular re-ed: Modified plank at counter 3x30 sec cues for position Mod plank shoulder taps x10 at counter BIL Green band row x10 w 5 sec hold  ER YTB iso walkouts 2x5  Therapeutic Activity: Pilates springboard hook zig zags 8-10 8 laps total, rest breaks as needed working on reaching endurance shoulder-head height Supine dowel shoulder flexion 2x12 for reaching mobility      PATIENT EDUCATION: Education details: rationale for interventions, HEP  Person educated: Patient Education method: Programmer, multimedia, Demonstration, Actor cues, Verbal cues Education comprehension: verbalized understanding, returned demonstration, verbal cues required, tactile cues required, and needs further education     HOME EXERCISE PROGRAM: Access Code: [email or web address] URL: [email or web address] Date: 08/24/2023 Prepared by: Mayme Spearman  Program Notes -please do scaption w/ yellow band as done in clinic  Exercises - Standing Single Arm Elbow Flexion with Resistance  - 2-3 x daily - 1 sets - 8 reps - Standing Shoulder Row with Anchored Resistance  - 3-4 x weekly - 2 sets - 8 reps - Standing Shoulder Scaption  - 3-4 x weekly - 2 sets - 8 reps - Full Plank on Counter, Opposite Shoulder Taps  - 3-4 x weekly - 2 sets - 8 reps  ASSESSMENT:  CLINICAL IMPRESSION: 09/08/2023 ; Pt arrives w/ soreness but no pain. Today continuing to progress functional reaching tasks, deltoid activation/stability training, and functional gripping tasks. No adverse events, some  intermittent numbness that resolves w/ nerve glides at end of session. No adverse events, no increase in pain. Recommend continuing along current POC in order to address relevant deficits and improve functional tolerance. Pt departs today's session in no acute distress, all voiced questions/concerns addressed appropriately from PT perspective.     OBJECTIVE IMPAIRMENTS: decreased activity tolerance, decreased endurance, decreased mobility, decreased ROM, decreased strength, impaired perceived functional ability, impaired UE functional use, and pain.   ACTIVITY LIMITATIONS: carrying, lifting, bathing, dressing, reach over head, and hygiene/grooming  PARTICIPATION LIMITATIONS: meal prep, cleaning, laundry, community activity, and occupation  PERSONAL FACTORS: Time since onset of injury/illness/exacerbation and 1-2 comorbidities: DM, HTN are also affecting patient's functional outcome.   REHAB POTENTIAL: Good  CLINICAL DECISION MAKING: Stable/uncomplicated  EVALUATION COMPLEXITY: Low   GOALS:  SHORT TERM GOALS: Target date: 07/13/2023  Pt will demonstrate appropriate understanding and performance of initially prescribed HEP in order to facilitate improved independence with management of symptoms.  Baseline: HEP established  07/12/23: reports good HEP adherence Goal status: MET  2. Pt will report at least 25% improvement in overall pain levels over past week in order to facilitate improved tolerance to typical daily activities.   Baseline: 0-9/10 07/12/23: 0-7/10 08/02/23: 0-6/10 08/22/23: 0-4/10 Goal status: MET  LONG TERM GOALS: Target date: 09/19/2023  last updated 08/22/23   Pt will score greater than 28 on PSFS in order to facilitate improved perception of function.   Baseline: 19   08/02/23: 40  Goal status: MET  2.  Pt will demonstrate at least 150 degrees of active R shoulder elevation in order to demonstrate improved tolerance to functional movement patterns such as reaching  overhead and into cabinets.  Baseline: see ROM chart above Goal status: MET  3.  Pt will demonstrate at  least 4+/5 shoulder flex/abd MMT for improved symmetry of UE strength and improved tolerance to functional movements.  Baseline: NT on eval 08/02/23: see objective measures  08/22/23: see MMT chart  Goal status: MET  4. Pt will report at least 50% decrease in overall pain levels in past week in order to facilitate improved tolerance to basic ADLs/mobility.   Baseline: 0-9/10  08/02/23: 0-6/10  08/22/23: 0-4/10  Goal status: MET  5. Pt will report ability to perform upper and lower body dressing with less than 3 pt increase in pain in order to facilitate improved tolerance to ADLs.  Baseline: inc pain with LBD/UBD  08/22/23: does not endorse any issues  Goal status: MET  6. Pt will be able to move 5# from waist to shoulder height for 20 repetitions in order to indicate improved functional shoulder endurance for work tasks.  Baseline: avoiding lifting tasks 08/02/23: Goal updated from 5- 20 repetitions; unable to tolerate lifting to shoulder height at this time 08/22/23: able to lift up to 3# for 15 repetitions, limited by fatigue  Goal status: PROGRESSING  7. Patient will be able to perform resisted D1/D2 Extension patterns in order to improve strength for adjusting equipment at work with no more than 3/10 pain.   Baseline: unable to tolerate d/t pain and radicular symptoms  08/22/23: moderate resistance without pain, does endorse fatigue  Goal Status: MET    PLAN: (updated 08/22/23)  PT FREQUENCY: 2x/week  PT DURATION: 4 weeks  PLANNED INTERVENTIONS: 44034- PT Re-evaluation, 97110-Therapeutic exercises, 97530- Therapeutic activity, 97112- Neuromuscular re-education, 97535- Self Care, [id number]- Manual therapy, [id number]- Electrical stimulation (unattended), Patient/Family education, Balance training, Stair training, Taping, Dry Needling, Joint mobilization, Spinal mobilization, Scar  mobilization, Cryotherapy, and Moist heat  PLAN FOR NEXT SESSION:  continue progressing R GH work within pt tolerance, with emphasis on muscular strength/endurance around shoulder height. Work [employer] manager. Try to mitigate deltoid compensations.   Lovett Ruck PT, DPT 09/08/2023 5:05 PM

## 2023-09-14 NOTE — Therapy (Signed)
 OUTPATIENT PHYSICAL THERAPY TREATMENT   Patient Name: Mackenzie Clark MRN: [id number] DOB:Jun 18, 1965, 58 y.o., female Today's Date: 08/22/2023     END OF SESSION:  PT End of Session - 09/15/23 1624     Visit Number 22    Number of Visits 25    Date for PT Re-Evaluation 09/19/23    Authorization Type workers comp    PT Start Time 1624    PT Stop Time 1704    PT Time Calculation (min) 40 min    Activity Tolerance Patient tolerated treatment well            Past Medical History:  Diagnosis Date   Allergy    Arthritis    r shoulder   Diabetes mellitus without complication (HCC)    diet and exercise controlled   Hypertension    Past Surgical History:  Procedure Laterality Date   DILATION AND CURETTAGE OF UTERUS  04/05/2004   16 week stillbirth   IR ANGIOGRAM PELVIS SELECTIVE OR SUPRASELECTIVE  08/31/2019   IR ANGIOGRAM SELECTIVE EACH ADDITIONAL VESSEL  08/31/2019   IR EMBO TUMOR ORGAN ISCHEMIA INFARCT INC GUIDE ROADMAPPING  08/31/2019   IR FLUORO GUIDED NEEDLE PLC ASPIRATION/INJECTION LOC  08/31/2019   IR RADIOLOGIST EVAL & MGMT  07/10/2019   IR RADIOLOGIST EVAL & MGMT  09/18/2019   IR US  GUIDE VASC ACCESS RIGHT  08/31/2019   right shoulder arthroscopy Right 06/02/2023   TONSILLECTOMY     and adnoids   Patient Active Problem List   Diagnosis Date Noted   Status post shoulder surgery 07/12/2023   History of hematuria 03/01/2023   Encounter for general adult medical examination w/o abnormal findings 11/01/2022   Aortic atherosclerosis (HCC) 11/01/2022   Anxiety 11/01/2022   Vitamin D  deficiency disease 11/01/2022   Irregular menses 11/01/2022   Sprain of right ankle 11/01/2022   Pure hypercholesterolemia 05/06/2022   Overweight with body mass index (BMI) of 29 to 29.9 in adult 01/22/2021   Dyslipidemia associated with type 2 diabetes mellitus (HCC) 01/22/2021   Fibroids 08/31/2019   Abnormal uterine bleeding due to intramural leiomyoma 08/31/2019   Abnormal  uterine bleeding (AUB) 05/21/2019   Uterine leiomyoma 05/21/2019   Body mass index (BMI) of 30.0-30.9 in adult 09/03/2017   Type II diabetes mellitus, uncontrolled 09/03/2017   Obesity 09/03/2017   Hypertensive heart disease 03/05/2013   Surveillance of previously prescribed contraceptive pill 03/05/2013    PCP: Cleave Curling, MD  REFERRING PROVIDER: Sammye Cristal, MD  REFERRING DIAG: S/P RIGHT SHOULDER SAD , DCE , CUFF DEBRIDEMENT  THERAPY DIAG:  Right shoulder pain, unspecified chronicity  Muscle weakness (generalized)  Rationale for Evaluation and Treatment: Rehabilitation  ONSET DATE: R shoulder cuff debridement, SAD, DCE DOS 06/02/23  Next MD appt: Next month per pt report  SUBJECTIVE:  SUBJECTIVE STATEMENT: 09/15/2023: states she has been having burning pain superior/lateral shoulder since last week, seems to be fairly consistent. Not really having numbness. Diclofenac  will relieve transiently. Hasn't done HEP due to pain and being busy outside of PT    Hand dominance: Left  PERTINENT HISTORY: HTN, DM2  PAIN:  Are you having pain: 4/10 currently, lateral/superior shoulder Worst since last visit: 6/10 (updated6/12/25)  Per eval -  Location/description: R shoulder Best-worst over past week: 0-9/10  - aggravating factors: upper body dressing, tying shoes, washing back, doing hair, sleeping - Easing factors: brace, ice pack     PRECAUTIONS: post op R shoulder ; per paper referral from physician okay for PROM/AAROM/AROM, strengthening   RED FLAGS: None   WEIGHT BEARING RESTRICTIONS: No  FALLS:  Has patient fallen in last 6 months? No  LIVING ENVIRONMENT: 2 story home, no issues with stairs, lives with son . 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  DIAGNOSTIC FINDINGS:  DOS 06/02/23 R shoulder SAD, DCE, cuff debridement  PATIENT SURVEYS:  Patient-specific activity scoring scheme (Point to one number):  0 represents "unable to perform." 10 represents "able to perform at prior level. 0 1 2 3 4 5 6 7 8 9  10 (Date and Score) Activity Initial  Activity Eval   08/02/23 08/22/23  Fasten bra  5 8  9   Hair (wash)  0 8 9  Wash back 5 8 8   Tie shoes 5 9 10   Reach into cabinets 4 7 9   Total 19 40 45  Avg  3.8 8 9    Total score = sum of the activity scores/number of activities Minimum detectable change (90%CI) for average score = 2 points Minimum detectable change (90%CI) for single activity score = 3 points Score: 19  COGNITION: Overall cognitive status: Within functional limits for tasks assessed     SENSATION: Denies sensory complaints  POSTURE: Mild guarding RUE  UPPER EXTREMITY ROM:  A/PROM Right eval Left eval Right 06/28/23 Right 07/06/23 Right 07/14/23 Right 08/02/23 Right 08/22/23 Right 09/15/23  Shoulder flexion A: 109 deg  162 A: 120 AA: 146  A: 143 deg  A: 150 deg painful at beginning of arc A: 145 deg  A: 163 deg A: 160 deg   Shoulder abduction A: 128 deg    A: 152 painful at beginning of arc A: 130 deg A: 154 deg A: 164 deg  Shoulder internal rotation           Shoulder external rotation           Elbow flexion          Elbow extension          Wrist flexion          Wrist extension           (Blank rows = not tested) (Key: WFL = within functional limits not formally assessed, * = concordant pain, s = stiffness/stretching sensation, NT = not tested)  Comments: PROM testing limited by muscle guarding  UPPER EXTREMITY MMT:  MMT Right 08/02/23 Left 08/02/23 R/L  08/22/23  Shoulder flexion 4 5 4+/5  Shoulder extension     Shoulder abduction 4 4+ 4+/5  Shoulder extension  Shoulder internal rotation 4 5 4+/5  Shoulder external rotation 4+ 5 4+/5  Elbow flexion     Elbow extension     Grip strength 40# 65#   (Blank rows = not tested)  (Key: WFL = within functional limits not formally assessed, * = concordant pain, s = stiffness/stretching sensation, NT = not tested)  Comments: deferred given proximity to surgery and pain w/ active mobility  PALPATION/OBSERVATION:  3 portal incisions all well appearing - no redness, swelling, drainage  FUNCTIONAL TESTING: 08/22/23:  - 2# unilateral reach, waist<>cabinet (~ eye level) 10 repetitions low RPE  - 3# unilateral reach, waist<>cabinet) 15 repetitions with moderate/hard RPE, no pain                                                                                                                              TREATMENT DATE:   [facility] Adult PT Treatment:                                                DATE: 09/15/23 Therapeutic Exercise: Shoulder flexion walkback at counter x10 Doorway posterior shoulder stretch 3x30sec  HEP discussion/education  Manual Therapy: Seated STM + trigger point release R deltoid/tricep  Therapeutic Activity: UE ranger flexion 3x5  UE ranger scaption 2x5 Education/discussion re: relevant anatomy/physiology as related to functional tolerance and symptom behavior   [facility] Adult PT Treatment:                                                DATE: 09/08/23 Neuromuscular Re-education: 1# DB flex/abd 2x10 of each  5# RUE bent row x8  Blue band row x10  Green band ER walkout 2x5  Ulnar nerve glides x10   Therapeutic Activity: Finger ladder abduction x8 (able to reach 28) Finger ladder flexion x8 (able to reach 28 )  Digi flex 5# opposition RUE x31min Yellow T bar sup/pron x5 each way     [facility] Adult PT Treatment:                                                DATE:  09/07/2023   Neuromuscular re-ed: Modified plank shoulder taps at counter 2x5 cues for pacing  Green band row 3x5 w 5 sec hold ER to neutral with green band 2 x 10  Supine blue band bicep curl 2x12 cueing to reduce deltoid compensations Digi-flex (5.0lb) resisted finger flexion x 2 minutes with breaks as needed  Therapeutic Activity: UE ranger flexion/abduction arcs 2x8  Pilates springboard hook zig zags 6-9 4 laps total  PATIENT EDUCATION: Education details: rationale for interventions, HEP  Person educated: Patient Education method: Explanation, Demonstration, Tactile cues, Verbal cues Education comprehension: verbalized understanding, returned demonstration, verbal cues required, tactile cues required, and needs further education     HOME EXERCISE PROGRAM: Access Code: [email or web address] URL: [email or web address] Date: 08/24/2023 Prepared by: Mayme Spearman  Program Notes -please do scaption w/ yellow band as done in clinic  Exercises - Standing Single Arm Elbow Flexion with Resistance  - 2-3 x daily - 1 sets - 8 reps - Standing Shoulder Row with Anchored Resistance  - 3-4 x weekly - 2 sets - 8 reps - Standing Shoulder Scaption  - 3-4 x weekly - 2 sets - 8 reps - Full Plank on Counter, Opposite Shoulder Taps  - 3-4 x weekly - 2 sets - 8 reps  ASSESSMENT:  CLINICAL IMPRESSION: 09/15/2023: Pt arrives w/ increase in pain over past week. Focusing on gentle mobility w/ aim of symptom modification and improved reaching tolerance w/ minimal change. Mild relief with posterior shoulder stretch but not concordant. She has significant TTP around posterior deltoid and proximal triceps which elicits distal symptoms, no infraspinatus/teres or supraspinatus tenderness. Aforementioned TTP improves significant w/ STM/trigger point release as above. No adverse events, pt reports modest improvement in symptoms (3/10) on departure. Recommend assessing POC next week after her reported surgical  follow up on Monday. Pt departs today's session in no acute distress, all voiced questions/concerns addressed appropriately from PT perspective.      OBJECTIVE IMPAIRMENTS: decreased activity tolerance, decreased endurance, decreased mobility, decreased ROM, decreased strength, impaired perceived functional ability, impaired UE functional use, and pain.   ACTIVITY LIMITATIONS: carrying, lifting, bathing, dressing, reach over head, and hygiene/grooming  PARTICIPATION LIMITATIONS: meal prep, cleaning, laundry, community activity, and occupation  PERSONAL FACTORS: Time since onset of injury/illness/exacerbation and 1-2 comorbidities: DM, HTN are also affecting patient's functional outcome.   REHAB POTENTIAL: Good  CLINICAL DECISION MAKING: Stable/uncomplicated  EVALUATION COMPLEXITY: Low   GOALS:  SHORT TERM GOALS: Target date: 07/13/2023  Pt will demonstrate appropriate understanding and performance of initially prescribed HEP in order to facilitate improved independence with management of symptoms.  Baseline: HEP established  07/12/23: reports good HEP adherence Goal status: MET  2. Pt will report at least 25% improvement in overall pain levels over past week in order to facilitate improved tolerance to typical daily activities.   Baseline: 0-9/10 07/12/23: 0-7/10 08/02/23: 0-6/10 08/22/23: 0-4/10 Goal status: MET  LONG TERM GOALS: Target date: 09/19/2023  last updated 08/22/23   Pt will score greater than 28 on PSFS in order to facilitate improved perception of function.   Baseline: 19   08/02/23: 40  Goal status: MET  2.  Pt will demonstrate at least 150 degrees of active R shoulder elevation in order to demonstrate improved tolerance to functional movement patterns such as reaching overhead and into cabinets.  Baseline: see ROM chart above Goal status: MET  3.  Pt will demonstrate at least 4+/5 shoulder flex/abd MMT for improved symmetry of UE strength and improved tolerance to  functional movements.  Baseline: NT on eval 08/02/23: see objective measures  08/22/23: see MMT chart  Goal status: MET  4. Pt will report at least 50% decrease in overall pain levels in past week in order to facilitate improved tolerance to basic ADLs/mobility.   Baseline: 0-9/10  08/02/23: 0-6/10  08/22/23: 0-4/10  Goal status: MET  5. Pt will report ability to perform upper and lower body dressing with less than  3 pt increase in pain in order to facilitate improved tolerance to ADLs.  Baseline: inc pain with LBD/UBD  08/22/23: does not endorse any issues  Goal status: MET  6. Pt will be able to move 5# from waist to shoulder height for 20 repetitions in order to indicate improved functional shoulder endurance for work tasks.  Baseline: avoiding lifting tasks 08/02/23: Goal updated from 5- 20 repetitions; unable to tolerate lifting to shoulder height at this time 08/22/23: able to lift up to 3# for 15 repetitions, limited by fatigue  Goal status: PROGRESSING  7. Patient will be able to perform resisted D1/D2 Extension patterns in order to improve strength for adjusting equipment at work with no more than 3/10 pain.   Baseline: unable to tolerate d/t pain and radicular symptoms  08/22/23: moderate resistance without pain, does endorse fatigue  Goal Status: MET    PLAN: (updated 08/22/23)  PT FREQUENCY: 2x/week  PT DURATION: 4 weeks  PLANNED INTERVENTIONS: 16109- PT Re-evaluation, 97110-Therapeutic exercises, 97530- Therapeutic activity, 97112- Neuromuscular re-education, 97535- Self Care, [id number]- Manual therapy, [id number]- Electrical stimulation (unattended), Patient/Family education, Balance training, Stair training, Taping, Dry Needling, Joint mobilization, Spinal mobilization, Scar mobilization, Cryotherapy, and Moist heat  PLAN FOR NEXT SESSION:  continue progressing R GH work within pt tolerance, with emphasis on muscular strength/endurance around shoulder height. Work [employer].  Try to mitigate deltoid compensations.   Lovett Ruck PT, DPT 09/15/2023 5:09 PM

## 2023-09-15 ENCOUNTER — Ambulatory Visit: Admitting: Physical Therapy

## 2023-09-15 ENCOUNTER — Encounter: Payer: Self-pay | Admitting: Physical Therapy

## 2023-09-15 DIAGNOSIS — M25511 Pain in right shoulder: Secondary | ICD-10-CM | POA: Diagnosis not present

## 2023-09-15 DIAGNOSIS — M6281 Muscle weakness (generalized): Secondary | ICD-10-CM

## 2023-09-19 ENCOUNTER — Other Ambulatory Visit: Payer: Self-pay

## 2023-09-19 ENCOUNTER — Other Ambulatory Visit: Payer: Self-pay | Admitting: Internal Medicine

## 2023-09-19 ENCOUNTER — Other Ambulatory Visit (HOSPITAL_COMMUNITY): Payer: Self-pay

## 2023-09-20 ENCOUNTER — Encounter: Payer: Self-pay | Admitting: Physical Therapy

## 2023-09-20 ENCOUNTER — Ambulatory Visit: Admitting: Physical Therapy

## 2023-09-20 ENCOUNTER — Other Ambulatory Visit: Payer: Self-pay

## 2023-09-20 ENCOUNTER — Other Ambulatory Visit (HOSPITAL_COMMUNITY): Payer: Self-pay

## 2023-09-20 DIAGNOSIS — M6281 Muscle weakness (generalized): Secondary | ICD-10-CM

## 2023-09-20 DIAGNOSIS — M25511 Pain in right shoulder: Secondary | ICD-10-CM

## 2023-09-20 MED ORDER — MOUNJARO 15 MG/0.5ML ~~LOC~~ SOAJ
15.0000 mg | SUBCUTANEOUS | 1 refills | Status: DC
  Filled 2023-09-20: qty 2, 28d supply, fill #0
  Filled 2023-10-20: qty 2, 28d supply, fill #1

## 2023-09-20 NOTE — Therapy (Signed)
 OUTPATIENT PHYSICAL THERAPY DISCHARGE + RECERTIFICATION   Patient Name: Mackenzie Clark MRN: [id number] DOB:Aug 30, 1965, 58 y.o., female Today's Date: 08/22/2023   PHYSICAL THERAPY DISCHARGE SUMMARY  Visits from Start of Care: 22  Current functional level related to goals / functional outcomes: Able to perform majority of daily/work tasks with pain   Remaining deficits: Pain, mild GH weakness   Education / Equipment: HEP, discharge education, follow up with provider   Patient agrees to discharge. Patient goals were met. Patient is being discharged due to meeting the stated rehab goals.      END OF SESSION:  PT End of Session - 09/20/23 1619     Visit Number 23    Number of Visits 25    Authorization Type workers comp    Authorization - Visit Number 11    Authorization - Number of Visits 12    PT Start Time 1620   late check in   PT Stop Time 1659    PT Time Calculation (min) 39 min             Past Medical History:  Diagnosis Date   Allergy    Arthritis    r shoulder   Diabetes mellitus without complication (HCC)    diet and exercise controlled   Hypertension    Past Surgical History:  Procedure Laterality Date   DILATION AND CURETTAGE OF UTERUS  04/05/2004   16 week stillbirth   IR ANGIOGRAM PELVIS SELECTIVE OR SUPRASELECTIVE  08/31/2019   IR ANGIOGRAM SELECTIVE EACH ADDITIONAL VESSEL  08/31/2019   IR EMBO TUMOR ORGAN ISCHEMIA INFARCT INC GUIDE ROADMAPPING  08/31/2019   IR FLUORO GUIDED NEEDLE PLC ASPIRATION/INJECTION LOC  08/31/2019   IR RADIOLOGIST EVAL & MGMT  07/10/2019   IR RADIOLOGIST EVAL & MGMT  09/18/2019   IR US  GUIDE VASC ACCESS RIGHT  08/31/2019   right shoulder arthroscopy Right 06/02/2023   TONSILLECTOMY     and adnoids   Patient Active Problem List   Diagnosis Date Noted   Status post shoulder surgery 07/12/2023   History of hematuria 03/01/2023   Encounter for general adult medical examination w/o abnormal findings 11/01/2022    Aortic atherosclerosis (HCC) 11/01/2022   Anxiety 11/01/2022   Vitamin D  deficiency disease 11/01/2022   Irregular menses 11/01/2022   Sprain of right ankle 11/01/2022   Pure hypercholesterolemia 05/06/2022   Overweight with body mass index (BMI) of 29 to 29.9 in adult 01/22/2021   Dyslipidemia associated with type 2 diabetes mellitus (HCC) 01/22/2021   Fibroids 08/31/2019   Abnormal uterine bleeding due to intramural leiomyoma 08/31/2019   Abnormal uterine bleeding (AUB) 05/21/2019   Uterine leiomyoma 05/21/2019   Body mass index (BMI) of 30.0-30.9 in adult 09/03/2017   Type II diabetes mellitus, uncontrolled 09/03/2017   Obesity 09/03/2017   Hypertensive heart disease 03/05/2013   Surveillance of previously prescribed contraceptive pill 03/05/2013    PCP: Cleave Curling, MD  REFERRING PROVIDER: Sammye Cristal, MD  REFERRING DIAG: S/P RIGHT SHOULDER SAD , DCE , CUFF DEBRIDEMENT  THERAPY DIAG:  Right shoulder pain, unspecified chronicity  Muscle weakness (generalized)  Rationale for Evaluation and Treatment: Rehabilitation  ONSET DATE: R shoulder cuff debridement, SAD, DCE DOS 06/02/23  Next MD appt: Next month per pt report  SUBJECTIVE:  SUBJECTIVE STATEMENT: 09/20/2023: pt states symptoms feel back to baseline, settled down around Saturday. Felt manual last session was helpful. Saw surgeon yesterday and was released. Still has pain with reaching movements, prolonged UE use, quick movements; she denies overt limitations. States she feels confident discharging at this time.    Hand dominance: Left  PERTINENT HISTORY: HTN, DM2  PAIN:  Are you having pain: 0/10 at rest, 1-2/10 with movement Worst since last visit: 4/10 (updated 09/20/23)   Per eval -  Location/description: R  shoulder Best-worst over past week: 0-9/10  - aggravating factors: upper body dressing, tying shoes, washing back, doing hair, sleeping - Easing factors: brace, ice pack     PRECAUTIONS: post op R shoulder ; per paper referral from physician okay for PROM/AAROM/AROM, strengthening   RED FLAGS: None   WEIGHT BEARING RESTRICTIONS: No  FALLS:  Has patient fallen in last 6 months? No  LIVING ENVIRONMENT: 2 story home, no issues with stairs, lives with son . 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  DIAGNOSTIC FINDINGS:  DOS 06/02/23 R shoulder SAD, DCE, cuff debridement  PATIENT SURVEYS:  Patient-specific activity scoring scheme (Point to one number):  0 represents "unable to perform." 10 represents "able to perform at prior level. 0 1 2 3 4 5 6 7 8 9  10 (Date and Score) Activity Initial  Activity Eval   08/02/23 08/22/23  Fasten bra  5 8  9   Hair (wash)  0 8 9  Wash back 5 8 8   Tie shoes 5 9 10   Reach into cabinets 4 7 9   Total 19 40 45  Avg  3.8 8 9    Total score = sum of the activity scores/number of activities Minimum detectable change (90%CI) for average score = 2 points Minimum detectable change (90%CI) for single activity score = 3 points Score: 19  COGNITION: Overall cognitive status: Within functional limits for tasks assessed     SENSATION: Denies sensory complaints  POSTURE: Mild guarding RUE  UPPER EXTREMITY ROM:  A/PROM Right eval Left eval Right 06/28/23 Right 07/06/23 Right 07/14/23 Right 08/02/23 Right 08/22/23 Right 09/15/23 Right 09/20/23  Shoulder flexion A: 109 deg  162 A: 120 AA: 146  A: 143 deg  A: 150 deg painful at beginning of arc A: 145 deg  A: 163 deg A: 160 deg   A: 162 deg s  Shoulder abduction A: 128 deg    A: 152 painful at beginning of arc A: 130 deg A: 154 deg A: 164 deg A: 164 deg s  Shoulder internal rotation            Shoulder external rotation            Elbow flexion           Elbow extension           Wrist flexion           Wrist extension            (Blank rows = not tested) (Key: WFL = within functional limits not formally assessed, * = concordant pain, s = stiffness/stretching sensation, NT = not tested)  Comments: PROM testing limited by muscle guarding  UPPER EXTREMITY MMT:  MMT Right  08/02/23 Left 08/02/23 R/L 08/22/23 R/L 09/20/23   Shoulder flexion 4 5 4+/5 4+/5  Shoulder extension      Shoulder abduction 4 4+ 4+/5 4+/5  Shoulder extension      Shoulder internal rotation 4 5 4+/5 5/5  Shoulder external rotation 4+ 5 4+/5 5/5  Elbow flexion      Elbow extension      Grip strength 40# 65#  50#/ 55#  (Blank rows = not tested)  (Key: WFL = within functional limits not formally assessed, * = concordant pain, s = stiffness/stretching sensation, NT = not tested)  Comments: deferred given proximity to surgery and pain w/ active mobility  PALPATION/OBSERVATION:  3 portal incisions all well appearing - no redness, swelling, drainage  FUNCTIONAL TESTING: 08/22/23:  - 2# unilateral reach, waist<>cabinet (~ eye level) 10 repetitions low RPE  - 3# unilateral reach, waist<>cabinet) 15 repetitions with moderate/hard RPE, no pain   09/20/23:  - waist<>cabinet 2# x10 unilat,3#x10 unilat, 5# x20 total (2 brief rest breaks after 10 reps)                                                                                                                             TREATMENT DATE:   [facility] Adult PT Treatment:                                                DATE: 09/20/23 Therapeutic Exercise: Modified plank counter taps 2x10 BIL HEP discussion/education + handout, pt politely declines additional practice w/ HEP, states she feels confident  performing at home. Education provided on appropriate progression/regression principles  Therapeutic Activity: MSK assessment + education Reaching/lifting assessment + education Education/discussion re: progress with PT, symptom behavior as it affects activity tolerance, PT goals/POC, discharge education, follow up with provider, strategies to progress/regress activity as indicated based on symptom behavior     PATIENT EDUCATION: Education details: PT POC, PT goals, progress with PT thus far, discharge planning, HEP, follow up with provider as needed Person educated: Patient Education method: Explanation, Demonstration, Verbal cues Education comprehension: verbalized understanding, returned demonstration    HOME EXERCISE PROGRAM: Access Code: [email or web address] URL: [email or web address] Date: 09/20/2023 Prepared by: Mayme Spearman  Exercises - Standing Single Arm Elbow Flexion with Resistance  - 3-4 x weekly - 2-3 sets - 8-12 reps - Standing Shoulder Row with Anchored Resistance  - 3-4 x weekly - 2-3 sets - 8-12 reps - Standing Shoulder Scaption  - 3-4 x weekly - 2-3 sets - 8-12 reps - Full Plank on Counter, Opposite Shoulder Taps  - 3-4 x weekly - 2-3 sets - 8-12 reps  ASSESSMENT:  CLINICAL IMPRESSION: 09/20/2023: Pt arrives w/ report of improved pain after recent exacerbation, states she saw surgeon yesterday and was released from their care. Overall she has made good progress in regards to  ROM and strength compared to start of care, but continues to have intermittent fluctuations in pain that are limiting progress w/ strengthening. In discussion w/ pt, mutual decision to d/c to independent HEP at this time. Discharge education is provided as above, thorough education to increase self efficacy with management of symptoms and program. No adverse events, pt verbalizes agreement/understanding w/ plan to discharge to independent HEP and provider follow up PRN. Pt departs today's  session in no acute distress, all voiced questions/concerns addressed appropriately from PT perspective.        OBJECTIVE IMPAIRMENTS: decreased activity tolerance, decreased endurance, decreased strength, impaired UE functional use, and pain.   ACTIVITY LIMITATIONS: carrying, lifting, bathing, dressing, reach over head, and hygiene/grooming  PARTICIPATION LIMITATIONS: meal prep, cleaning, laundry, community activity, and occupation  PERSONAL FACTORS: Time since onset of injury/illness/exacerbation and 1-2 comorbidities: DM, HTN are also affecting patient's functional outcome.   REHAB POTENTIAL: Good  CLINICAL DECISION MAKING: Stable/uncomplicated  EVALUATION COMPLEXITY: Low   GOALS:  SHORT TERM GOALS: Target date: 07/13/2023  Pt will demonstrate appropriate understanding and performance of initially prescribed HEP in order to facilitate improved independence with management of symptoms.  Baseline: HEP established  07/12/23: reports good HEP adherence Goal status: MET  2. Pt will report at least 25% improvement in overall pain levels over past week in order to facilitate improved tolerance to typical daily activities.   Baseline: 0-9/10 07/12/23: 0-7/10 08/02/23: 0-6/10 08/22/23: 0-4/10 Goal status: MET  LONG TERM GOALS: Target date: 09/19/2023  last updated 08/22/23   Pt will score greater than 28 on PSFS in order to facilitate improved perception of function.   Baseline: 19   08/02/23: 40  Goal status: MET  2.  Pt will demonstrate at least 150 degrees of active R shoulder elevation in order to demonstrate improved tolerance to functional movement patterns such as reaching overhead and into cabinets.  Baseline: see ROM chart above Goal status: MET  3.  Pt will demonstrate at least 4+/5 shoulder flex/abd MMT for improved symmetry of UE strength and improved tolerance to functional movements.  Baseline: NT on eval 08/02/23: see objective measures  08/22/23: see MMT chart  Goal  status: MET  4. Pt will report at least 50% decrease in overall pain levels in past week in order to facilitate improved tolerance to basic ADLs/mobility.   Baseline: 0-9/10  08/02/23: 0-6/10  08/22/23: 0-4/10  Goal status: MET  5. Pt will report ability to perform upper and lower body dressing with less than 3 pt increase in pain in order to facilitate improved tolerance to ADLs.  Baseline: inc pain with LBD/UBD  08/22/23: does not endorse any issues  Goal status: MET  6. Pt will be able to move 5# from waist to shoulder height for 20 repetitions in order to indicate improved functional shoulder endurance for work tasks.  Baseline: avoiding lifting tasks 08/02/23: Goal updated from 5- 20 repetitions; unable to tolerate lifting to shoulder height at this time 08/22/23: able to lift up to 3# for 15 repetitions, limited by fatigue 09/20/23: 5# 10 repetitions limited by fatigue, able to complete 20 reps total after break  Goal status: MET  7. Patient will be able to perform resisted D1/D2 Extension patterns in order to improve strength for adjusting equipment at work with no more than 3/10 pain.   Baseline: unable to tolerate d/t pain and radicular symptoms  08/22/23: moderate resistance without pain, does endorse fatigue  Goal Status: MET    PLAN:  DISCHARGE 09/20/23   Lovett Ruck PT, DPT 09/20/2023 5:07 PM

## 2023-10-04 ENCOUNTER — Other Ambulatory Visit: Payer: Self-pay

## 2023-10-20 ENCOUNTER — Other Ambulatory Visit: Payer: Self-pay

## 2023-10-27 ENCOUNTER — Encounter: Payer: Self-pay | Admitting: Internal Medicine

## 2023-11-21 ENCOUNTER — Ambulatory Visit (INDEPENDENT_AMBULATORY_CARE_PROVIDER_SITE_OTHER): Admitting: Internal Medicine

## 2023-11-21 ENCOUNTER — Encounter: Payer: Self-pay | Admitting: Internal Medicine

## 2023-11-21 ENCOUNTER — Ambulatory Visit: Payer: Self-pay | Admitting: Internal Medicine

## 2023-11-21 ENCOUNTER — Other Ambulatory Visit: Payer: Self-pay

## 2023-11-21 ENCOUNTER — Other Ambulatory Visit (HOSPITAL_COMMUNITY): Payer: Self-pay

## 2023-11-21 VITALS — BP 120/82 | HR 93 | Temp 98.4°F | Ht 67.0 in | Wt 174.0 lb

## 2023-11-21 DIAGNOSIS — I119 Hypertensive heart disease without heart failure: Secondary | ICD-10-CM

## 2023-11-21 DIAGNOSIS — Z0000 Encounter for general adult medical examination without abnormal findings: Secondary | ICD-10-CM

## 2023-11-21 DIAGNOSIS — L989 Disorder of the skin and subcutaneous tissue, unspecified: Secondary | ICD-10-CM

## 2023-11-21 DIAGNOSIS — E785 Hyperlipidemia, unspecified: Secondary | ICD-10-CM

## 2023-11-21 DIAGNOSIS — I700 Atherosclerosis of aorta: Secondary | ICD-10-CM | POA: Diagnosis not present

## 2023-11-21 DIAGNOSIS — E1169 Type 2 diabetes mellitus with other specified complication: Secondary | ICD-10-CM

## 2023-11-21 DIAGNOSIS — Z1159 Encounter for screening for other viral diseases: Secondary | ICD-10-CM

## 2023-11-21 DIAGNOSIS — Z1211 Encounter for screening for malignant neoplasm of colon: Secondary | ICD-10-CM

## 2023-11-21 DIAGNOSIS — E876 Hypokalemia: Secondary | ICD-10-CM

## 2023-11-21 DIAGNOSIS — E039 Hypothyroidism, unspecified: Secondary | ICD-10-CM | POA: Diagnosis not present

## 2023-11-21 LAB — POCT URINALYSIS DIP (CLINITEK)
Bilirubin, UA: NEGATIVE
Glucose, UA: NEGATIVE mg/dL
Ketones, POC UA: NEGATIVE mg/dL
Leukocytes, UA: NEGATIVE
Nitrite, UA: NEGATIVE
POC PROTEIN,UA: NEGATIVE
Spec Grav, UA: 1.02 (ref 1.010–1.025)
Urobilinogen, UA: 0.2 U/dL
pH, UA: 7 (ref 5.0–8.0)

## 2023-11-21 MED ORDER — OLMESARTAN MEDOXOMIL-HCTZ 40-25 MG PO TABS
1.0000 | ORAL_TABLET | Freq: Every day | ORAL | 2 refills | Status: AC
  Filled 2023-11-21: qty 90, 90d supply, fill #0
  Filled 2024-02-24: qty 90, 90d supply, fill #1

## 2023-11-21 MED ORDER — MOUNJARO 15 MG/0.5ML ~~LOC~~ SOAJ
15.0000 mg | SUBCUTANEOUS | 1 refills | Status: AC
  Filled 2023-11-21: qty 6, 84d supply, fill #0
  Filled 2024-02-24: qty 6, 84d supply, fill #1

## 2023-11-21 NOTE — Assessment & Plan Note (Signed)

## 2023-11-21 NOTE — Assessment & Plan Note (Signed)
 cChronic, diabetic foot exam was performed.  Type 2 diabetes mellitus, currently managed with Mounjaro . - Refill Mounjaro , attempt a 90-day supply if possible. - Follow up in four months - I DISCUSSED WITH THE PATIENT AT LENGTH REGARDING THE GOALS OF GLYCEMIC CONTROL AND POSSIBLE LONG-TERM COMPLICATIONS.  I  ALSO STRESSED THE IMPORTANCE OF COMPLIANCE WITH HOME GLUCOSE MONITORING, DIETARY RESTRICTIONS INCLUDING AVOIDANCE OF SUGARY DRINKS/PROCESSED FOODS,  ALONG WITH REGULAR EXERCISE.  I  ALSO STRESSED THE IMPORTANCE OF ANNUAL EYE EXAMS, SELF FOOT CARE AND COMPLIANCE WITH OFFICE VISITS.

## 2023-11-21 NOTE — Progress Notes (Unsigned)
 I,Victoria T Emmitt, CMA,acting as a Neurosurgeon for Catheryn LOISE Slocumb, MD.,have documented all relevant documentation on the behalf of Catheryn LOISE Slocumb, MD,as directed by  Catheryn LOISE Slocumb, MD while in the presence of Catheryn LOISE Slocumb, MD.  Subjective:    Patient ID: Mackenzie Clark , female    DOB: [birth year] , 58 y.o.   MRN: [id number]  Chief Complaint  Patient presents with  . Annual Exam    Patient presents today for annual exam. She reports compliance with medications. Denies headache, chest pain & sob. GYN: Dr Fredirick  . Hypertension  . Diabetes    HPI Discussed the use of AI scribe software for clinical note transcription with the patient, who gave verbal consent to proceed.  History of Present Illness Mackenzie Clark is a 58 year old female with hypertension and diabetes who presents for a physical exam and diabetes check.  She manages her hypertension with amlodipine  but has not filled her prescription since February, as she had a new bottle at home. She is on rosuvastatin  for hyperlipidemia, though she sometimes forgets to take it at bedtime despite keeping it on her nightstand as a reminder. She previously had difficulty with atorvastatin  and was advised to take it with plenty of water and Coenzyme Q10.  For diabetes management, she uses Mounjaro  and is due for a refill. She has an eye exam scheduled for later in the day as part of her diabetes care routine. She previously set a goal weight of 180 pounds, then 170 pounds, and is considering setting a new goal weight.  She takes Wellbutrin  for depression and anxiety, which also helps with attention deficit disorder related to her previous job and school. She considered stopping it but has decided to continue for now due to its benefits.  She experiences bowel movements every other day and uses Miralax in her coffee about three times a week to aid regularity. She also has Linzess samples for occasional use if needed. No significant changes  in her bowel habits.   Diabetes She presents for her follow-up diabetic visit. She has type 2 diabetes mellitus. Her disease course has been stable. Pertinent negatives for diabetes include no blurred vision. There are no hypoglycemic complications. Risk factors for coronary artery disease include diabetes mellitus, hypertension and obesity. She is compliant with treatment most of the time. She participates in exercise intermittently. An ACE inhibitor/angiotensin II receptor blocker is being taken. Eye exam is current.  Hypertension This is a chronic problem. The current episode started more than 1 year ago. The problem has been gradually improving since onset. The problem is controlled. Pertinent negatives include no blurred vision. Past treatments include angiotensin blockers.     Past Medical History:  Diagnosis Date  . Allergy   . Arthritis    r shoulder  . Diabetes mellitus without complication (HCC)    diet and exercise controlled  . Hypertension      Family History  Problem Relation Age of Onset  . Hypertension Mother   . Hyperlipidemia Mother   . Hypertension Father   . Diabetes Father   . Heart disease Father        heart attack age 58  . Hypertension Brother   . Cancer Maternal Grandmother 52       Breast  . Obesity Other   . Colon cancer Neg Hx   . Colon polyps Neg Hx   . Esophageal cancer Neg Hx   . Rectal cancer Neg Hx   .  Stomach cancer Neg Hx      Current Outpatient Medications:  .  amLODipine  (NORVASC ) 10 MG tablet, Take 1 tablet (10 mg total) by mouth daily., Disp: 90 tablet, Rfl: 2 .  Bioflavonoid Products (VITAMIN C) CHEW, Chew 1 tablet by mouth 3 (three) times a week., Disp: , Rfl:  .  buPROPion  (WELLBUTRIN  XL) 150 MG 24 hr tablet, Take 1 tablet (150 mg total) by mouth every morning., Disp: 90 tablet, Rfl: 1 .  diclofenac  Sodium (VOLTAREN  ARTHRITIS PAIN) 1 % GEL, Apply 2 g topically 4 (four) times daily. (Patient taking differently: Apply 2 g topically 4  (four) times daily as needed (pain).), Disp: 100 g, Rfl: 0 .  fluticasone  (FLONASE ) 50 MCG/ACT nasal spray, Place 2 sprays into both nostrils daily. (Patient taking differently: Place 2 sprays into both nostrils daily as needed for allergies.), Disp: 54 g, Rfl: 2 .  glucose blood test strip, 1 each by Other route 2 (two) times daily. Use as instructed, Disp: , Rfl:  .  rosuvastatin  (CRESTOR ) 5 MG tablet, Take 1 tablet (5 mg total) by mouth daily., Disp: 90 tablet, Rfl: 3 .  valACYclovir  (VALTREX ) 1000 MG tablet, Take 1 tablet (1,000 mg total) by mouth 2 (two) times daily as needed., Disp: 30 tablet, Rfl: 0 .  olmesartan -hydrochlorothiazide  (BENICAR  HCT) 40-25 MG tablet, Take 1 tablet by mouth daily., Disp: 90 tablet, Rfl: 2 .  tirzepatide  (MOUNJARO ) 15 MG/0.5ML Pen, Inject 15 mg into the skin once a week., Disp: 6 mL, Rfl: 1   Allergies  Allergen Reactions  . Shellfish Allergy Swelling      The patient states she uses none for birth control. No LMP recorded (exact date). Patient is perimenopausal.. Negative for Dysmenorrhea. Negative for: breast discharge, breast lump(s), breast pain and breast self exam. Associated symptoms include abnormal vaginal bleeding. Pertinent negatives include abnormal bleeding (hematology), anxiety, decreased libido, depression, difficulty falling sleep, dyspareunia, history of infertility, nocturia, sexual dysfunction, sleep disturbances, urinary incontinence, urinary urgency, vaginal discharge and vaginal itching. Diet regular.The patient states her exercise level is    . The patient's tobacco use is:  Social History   Tobacco Use  Smoking Status Never  Smokeless Tobacco Never  . She has been exposed to passive smoke. The patient's alcohol use is:  Social History   Substance and Sexual Activity  Alcohol Use Yes   Comment: occasionally    Review of Systems  Constitutional: Negative.   HENT: Negative.    Eyes: Negative.  Negative for blurred vision.   Respiratory: Negative.    Cardiovascular: Negative.   Gastrointestinal: Negative.   Endocrine: Negative.   Genitourinary: Negative.   Musculoskeletal: Negative.   Skin: Negative.   Allergic/Immunologic: Negative.   Neurological: Negative.   Hematological: Negative.   Psychiatric/Behavioral: Negative.       Today's Vitals   11/21/23 1408  BP: 120/82  Pulse: 93  Temp: 98.4 F (36.9 C)  SpO2: 98%  Weight: 174 lb (78.9 kg)  Height: 5' 7 (1.702 m)   Body mass index is 27.25 kg/m.  Wt Readings from Last 3 Encounters:  11/21/23 174 lb (78.9 kg)  07/11/23 175 lb (79.4 kg)  06/02/23 176 lb (79.8 kg)     Objective:  Physical Exam Vitals and nursing note reviewed.  Constitutional:      Appearance: Normal appearance.  HENT:     Head: Normocephalic and atraumatic.     Right Ear: Tympanic membrane, ear canal and external ear normal.  Left Ear: Tympanic membrane, ear canal and external ear normal.     Nose: Nose normal.     Mouth/Throat:     Mouth: Mucous membranes are moist.     Pharynx: Oropharynx is clear.  Eyes:     Extraocular Movements: Extraocular movements intact.     Conjunctiva/sclera: Conjunctivae normal.     Pupils: Pupils are equal, round, and reactive to light.  Cardiovascular:     Rate and Rhythm: Normal rate and regular rhythm.     Pulses: Normal pulses.     Heart sounds: Normal heart sounds.  Pulmonary:     Effort: Pulmonary effort is normal.     Breath sounds: Normal breath sounds.  Chest:  Breasts:    Tanner Score is 5.     Right: Normal.     Left: Normal.  Abdominal:     General: Abdomen is flat. Bowel sounds are normal.     Palpations: Abdomen is soft.  Genitourinary:    Comments: deferred Musculoskeletal:        General: Normal range of motion.     Cervical back: Normal range of motion and neck supple.  Skin:    General: Skin is warm and dry.  Neurological:     General: No focal deficit present.     Mental Status: She is alert and  oriented to person, place, and time.  Psychiatric:        Mood and Affect: Mood normal.        Behavior: Behavior normal.        Assessment And Plan:     Encounter for general adult medical examination w/o abnormal findings Assessment & Plan: A full exam was performed.  Importance of monthly self breast exams was discussed with the patient.  She is advised to get 30-45 minutes of regular exercise, no less than four to five days per week. Both weight-bearing and aerobic exercises are recommended.  She is advised to follow a healthy diet with at least six fruits/veggies per day, decrease intake of red meat and other saturated fats and to increase fish intake to twice weekly.  Meats/fish should not be fried -- baked, boiled or broiled is preferable. It is also important to cut back on your sugar intake.  Be sure to read labels - try to avoid anything with added sugar, high fructose corn syrup or other sweeteners.  If you must use a sweetener, you can try stevia or monkfruit.  It is also important to avoid artificially sweetened foods/beverages and diet drinks. Lastly, wear SPF 50 sunscreen on exposed skin and when in direct sunlight for an extended period of time.  Be sure to avoid fast food restaurants and aim for at least 60 ounces of water daily.      Orders: -     CBC -     CMP14+EGFR -     Hemoglobin A1c  Dyslipidemia associated with type 2 diabetes mellitus (HCC) Assessment & Plan: Chronic, diabetic foot exam was performed.     Hypertensive heart disease without heart failure Assessment & Plan: Chronic, controlled.  EKG performed, NSR w/o acute changes.  Blood pressure managed with amlodipine  and olmesartan  with HCT. Low potassium possibly dietary. Discussed diuretic adjustment if necessary. - Recheck potassium levels. - Consider removing the diuretic component from olmesartan  if potassium remains low.  Orders: -     POCT URINALYSIS DIP (CLINITEK) -     Microalbumin / creatinine  urine ratio -     EKG  12-Lead -     Olmesartan  Medoxomil-HCTZ; Take 1 tablet by mouth daily.  Dispense: 90 tablet; Refill: 2  Aortic atherosclerosis (HCC)  Skin lesion -     Ambulatory referral to Dermatology  Need for hepatitis B screening test -     Hepatitis B surface antibody,qualitative  Colon cancer screening  Other orders -     Mounjaro ; Inject 15 mg into the skin once a week.  Dispense: 6 mL; Refill: 1   Assessment & Plan Hyperlipidemia Hyperlipidemia, currently managed with rosuvastatin . Previous issues with atorvastatin  noted. LDL was 91 in April, goal is less than 70. She sometimes forgets to take medication. Advised to take it at dinner if bedtime doses are missed. - Check liver and kidney function. - Check cholesterol levels next visit.  Constipation Chronic constipation, managed with Miralax and occasional use of Linzess. Bowel movements occur every other day with Miralax use. Linzess is effective when used. - Consider 90-day prescription for Linzess for $30.  Depression Depression, currently managed with Wellbutrin . She considered stopping but decided to continue due to potential benefits for anxiety and ADD. Discussed that stopping Wellbutrin  is not recommended without a proper weaning process. - Continue Wellbutrin . Return for 1 YR HM, 4 MONTH DM F/U.SABRA Patient was given opportunity to ask questions. Patient verbalized understanding of the plan and was able to repeat key elements of the plan. All questions were answered to their satisfaction.   I, Catheryn LOISE Slocumb, MD, have reviewed all documentation for this visit. The documentation on 11/21/23 for the exam, diagnosis, procedures, and orders are all accurate and complete.

## 2023-11-21 NOTE — Patient Instructions (Signed)

## 2023-11-22 ENCOUNTER — Other Ambulatory Visit: Payer: Self-pay

## 2023-11-22 ENCOUNTER — Encounter: Payer: Self-pay | Admitting: Internal Medicine

## 2023-11-22 LAB — CMP14+EGFR
ALT: 12 IU/L (ref 0–32)
AST: 18 IU/L (ref 0–40)
Albumin: 4.5 g/dL (ref 3.8–4.9)
Alkaline Phosphatase: 87 IU/L (ref 44–121)
BUN/Creatinine Ratio: 12 (ref 9–23)
BUN: 11 mg/dL (ref 6–24)
Bilirubin Total: 0.8 mg/dL (ref 0.0–1.2)
CO2: 23 mmol/L (ref 20–29)
Calcium: 9.6 mg/dL (ref 8.7–10.2)
Chloride: 100 mmol/L (ref 96–106)
Creatinine, Ser: 0.91 mg/dL (ref 0.57–1.00)
Globulin, Total: 2.4 g/dL (ref 1.5–4.5)
Glucose: 83 mg/dL (ref 70–99)
Potassium: 3 mmol/L — ABNORMAL LOW (ref 3.5–5.2)
Sodium: 140 mmol/L (ref 134–144)
Total Protein: 6.9 g/dL (ref 6.0–8.5)
eGFR: 75 mL/min/1.73 (ref >59)

## 2023-11-22 LAB — CBC
Hematocrit: 38.9 % (ref 34.0–46.6)
Hemoglobin: 12.5 g/dL (ref 11.1–15.9)
MCH: 29.2 pg (ref 26.6–33.0)
MCHC: 32.1 g/dL (ref 31.5–35.7)
MCV: 91 fL (ref 79–97)
Platelets: 244 x10E3/uL (ref 150–450)
RBC: 4.28 x10E6/uL (ref 3.77–5.28)
RDW: 13.7 % (ref 11.7–15.4)
WBC: 4.2 x10E3/uL (ref 3.4–10.8)

## 2023-11-22 LAB — MICROALBUMIN / CREATININE URINE RATIO
Creatinine, Urine: 72.5 mg/dL
Microalb/Creat Ratio: 5 mg/g{creat} (ref 0–29)
Microalbumin, Urine: 3.6 ug/mL

## 2023-11-22 LAB — HEMOGLOBIN A1C
Est. average glucose Bld gHb Est-mCnc: 108 mg/dL
Hgb A1c MFr Bld: 5.4 % (ref 4.8–5.6)

## 2023-11-22 LAB — HEPATITIS B SURFACE ANTIBODY,QUALITATIVE: Hep B Surface Ab, Qual: REACTIVE

## 2023-11-22 NOTE — Assessment & Plan Note (Signed)
 Chronic, LDL goal < 70.  She will continue with rosuvastatin  and ASA 81mg  daily.  - Encouraged to follow heart healthy lifestyle

## 2023-11-22 NOTE — Assessment & Plan Note (Signed)
 Chronic, controlled.  EKG performed, NSR w/o acute changes.  Blood pressure managed with amlodipine  and olmesartan  with HCT. Low potassium possibly dietary. Discussed diuretic adjustment if necessary. - Recheck potassium levels. - Consider removing the diuretic component from olmesartan  if potassium remains low.

## 2023-11-28 LAB — MAGNESIUM: Magnesium: 2 mg/dL (ref 1.6–2.3)

## 2023-11-28 LAB — SPECIMEN STATUS REPORT

## 2023-12-08 ENCOUNTER — Other Ambulatory Visit: Payer: Self-pay | Admitting: Internal Medicine

## 2023-12-09 ENCOUNTER — Other Ambulatory Visit (HOSPITAL_COMMUNITY): Payer: Self-pay

## 2023-12-09 ENCOUNTER — Other Ambulatory Visit: Payer: Self-pay

## 2023-12-09 MED ORDER — VALACYCLOVIR HCL 1 G PO TABS
1000.0000 mg | ORAL_TABLET | Freq: Two times a day (BID) | ORAL | 0 refills | Status: AC | PRN
  Filled 2023-12-09: qty 30, 15d supply, fill #0

## 2023-12-15 ENCOUNTER — Other Ambulatory Visit: Payer: Self-pay | Admitting: Internal Medicine

## 2023-12-15 ENCOUNTER — Other Ambulatory Visit (HOSPITAL_COMMUNITY): Payer: Self-pay

## 2023-12-15 ENCOUNTER — Other Ambulatory Visit: Payer: Self-pay

## 2023-12-15 ENCOUNTER — Other Ambulatory Visit

## 2023-12-15 DIAGNOSIS — R8290 Unspecified abnormal findings in urine: Secondary | ICD-10-CM

## 2023-12-15 DIAGNOSIS — E876 Hypokalemia: Secondary | ICD-10-CM

## 2023-12-15 MED ORDER — BUPROPION HCL ER (XL) 150 MG PO TB24
150.0000 mg | ORAL_TABLET | ORAL | 1 refills | Status: AC
  Filled 2023-12-15: qty 90, 90d supply, fill #0
  Filled 2024-03-13: qty 90, 90d supply, fill #1

## 2023-12-15 NOTE — Progress Notes (Signed)
 I,Mackenzie Clark, CMA,acting as a Neurosurgeon for [place], CMA.,have documented all relevant documentation on the behalf of Mackenzie Clark, [place] directed by  Mackenzie Clark, CMA while in the presence of Mackenzie Clark, [place].  Subjective:  Patient ID: Mackenzie Clark , female    DOB: [birth year] , 58 y.o.   MRN: [id number]  No chief complaint on file.   HPI  HPI   Past Medical History:  Diagnosis Date   Allergy    Arthritis    r shoulder   Diabetes mellitus without complication (HCC)    diet and exercise controlled   Hypertension      Family History  Problem Relation Age of Onset   Hypertension Mother    Hyperlipidemia Mother    Hypertension Father    Diabetes Father    Heart disease Father        heart attack age 37   Hypertension Brother    Cancer Maternal Grandmother 20       Breast   Obesity Other    Colon cancer Neg Hx    Colon polyps Neg Hx    Esophageal cancer Neg Hx    Rectal cancer Neg Hx    Stomach cancer Neg Hx      Current Outpatient Medications:    amLODipine  (NORVASC ) 10 MG tablet, Take 1 tablet (10 mg total) by mouth daily., Disp: 90 tablet, Rfl: 2   Bioflavonoid Products (VITAMIN C) CHEW, Chew 1 tablet by mouth 3 (three) times a week., Disp: , Rfl:    buPROPion  (WELLBUTRIN  XL) 150 MG 24 hr tablet, Take 1 tablet (150 mg total) by mouth every morning., Disp: 90 tablet, Rfl: 1   diclofenac  Sodium (VOLTAREN  ARTHRITIS PAIN) 1 % GEL, Apply 2 g topically 4 (four) times daily. (Patient taking differently: Apply 2 g topically 4 (four) times daily as needed (pain).), Disp: 100 g, Rfl: 0   fluticasone  (FLONASE ) 50 MCG/ACT nasal spray, Place 2 sprays into both nostrils daily. (Patient taking differently: Place 2 sprays into both nostrils daily as needed for allergies.), Disp: 54 g, Rfl: 2   glucose blood test strip, 1 each by Other route 2 (two) times daily. Use as instructed, Disp: , Rfl:    olmesartan -hydrochlorothiazide  (BENICAR  HCT)  40-25 MG tablet, Take 1 tablet by mouth daily., Disp: 90 tablet, Rfl: 2   rosuvastatin  (CRESTOR ) 5 MG tablet, Take 1 tablet (5 mg total) by mouth daily., Disp: 90 tablet, Rfl: 3   tirzepatide  (MOUNJARO ) 15 MG/0.5ML Pen, Inject 15 mg into the skin once a week., Disp: 6 mL, Rfl: 1   valACYclovir  (VALTREX ) 1000 MG tablet, Take 1 tablet (1,000 mg total) by mouth 2 (two) times daily as needed., Disp: 30 tablet, Rfl: 0   Allergies  Allergen Reactions   Shellfish Allergy Swelling     Review of Systems   There were no vitals filed for this visit. There is no height or weight on file to calculate BMI.  Wt Readings from Last 3 Encounters:  11/21/23 174 lb (78.9 kg)  07/11/23 175 lb (79.4 kg)  06/02/23 176 lb (79.8 kg)     Objective:  Physical Exam      Assessment And Plan:  There are no diagnoses linked to this encounter.   No follow-ups on file.  Patient was given opportunity to ask questions. Patient verbalized understanding of the plan and was able to repeat key elements of the plan. All questions were answered to their satisfaction.  Mackenzie Clark, CMA  I, Mackenzie Clark, CMA, have reviewed all documentation for this visit. The documentation on 12/15/23 for the exam, diagnosis, procedures, and orders are all accurate and complete.   IF YOU HAVE BEEN REFERRED TO A SPECIALIST, IT MAY TAKE 1-2 WEEKS TO SCHEDULE/PROCESS THE REFERRAL. IF YOU HAVE NOT HEARD FROM US /SPECIALIST IN TWO WEEKS, PLEASE GIVE US  A CALL AT [phone].   THE PATIENT IS ENCOURAGED TO PRACTICE SOCIAL DISTANCING DUE TO THE COVID-19 PANDEMIC.

## 2023-12-16 ENCOUNTER — Other Ambulatory Visit (HOSPITAL_COMMUNITY): Payer: Self-pay

## 2023-12-16 LAB — MICROSCOPIC EXAMINATION
Bacteria, UA: NONE SEEN
Casts: NONE SEEN /LPF
Epithelial Cells (non renal): NONE SEEN /HPF (ref 0–10)
RBC, Urine: NONE SEEN /HPF (ref 0–2)

## 2023-12-16 LAB — URINALYSIS, COMPLETE
Bilirubin, UA: NEGATIVE
Glucose, UA: NEGATIVE
Ketones, UA: NEGATIVE
Leukocytes,UA: NEGATIVE
Nitrite, UA: NEGATIVE
Protein,UA: NEGATIVE
RBC, UA: NEGATIVE
Specific Gravity, UA: 1.017 (ref 1.005–1.030)
Urobilinogen, Ur: 0.2 mg/dL (ref 0.2–1.0)
pH, UA: 7 (ref 5.0–7.5)

## 2023-12-19 ENCOUNTER — Ambulatory Visit: Payer: Self-pay | Admitting: Internal Medicine

## 2024-02-24 ENCOUNTER — Other Ambulatory Visit: Payer: Self-pay

## 2024-02-24 ENCOUNTER — Other Ambulatory Visit (HOSPITAL_COMMUNITY): Payer: Self-pay

## 2024-03-13 ENCOUNTER — Other Ambulatory Visit: Payer: Self-pay

## 2024-03-13 ENCOUNTER — Other Ambulatory Visit: Payer: Self-pay | Admitting: Cardiology

## 2024-03-13 ENCOUNTER — Other Ambulatory Visit: Payer: Self-pay | Admitting: Internal Medicine

## 2024-03-13 ENCOUNTER — Other Ambulatory Visit (HOSPITAL_COMMUNITY): Payer: Self-pay

## 2024-03-21 ENCOUNTER — Encounter: Payer: Self-pay | Admitting: Internal Medicine

## 2024-04-02 ENCOUNTER — Other Ambulatory Visit (HOSPITAL_COMMUNITY): Payer: Self-pay

## 2024-04-02 ENCOUNTER — Other Ambulatory Visit: Payer: Self-pay | Admitting: Cardiology

## 2024-04-02 MED ORDER — ROSUVASTATIN CALCIUM 5 MG PO TABS
5.0000 mg | ORAL_TABLET | Freq: Every day | ORAL | 0 refills | Status: AC
  Filled 2024-04-02: qty 30, 30d supply, fill #0

## 2024-04-03 ENCOUNTER — Other Ambulatory Visit (HOSPITAL_COMMUNITY): Payer: Self-pay

## 2024-04-03 ENCOUNTER — Other Ambulatory Visit: Payer: Self-pay | Admitting: Internal Medicine

## 2024-04-03 MED ORDER — DICLOFENAC SODIUM 1 % EX GEL
2.0000 g | Freq: Four times a day (QID) | CUTANEOUS | 0 refills | Status: AC
  Filled 2024-04-03: qty 100, 13d supply, fill #0

## 2024-04-03 MED ORDER — AMLODIPINE BESYLATE 10 MG PO TABS
10.0000 mg | ORAL_TABLET | Freq: Every day | ORAL | 2 refills | Status: AC
  Filled 2024-04-03: qty 90, 90d supply, fill #0

## 2024-04-04 ENCOUNTER — Other Ambulatory Visit: Payer: Self-pay

## 2024-04-12 ENCOUNTER — Encounter: Payer: Self-pay | Admitting: Dermatology

## 2024-04-12 ENCOUNTER — Ambulatory Visit: Admitting: Dermatology

## 2024-04-12 VITALS — BP 138/91

## 2024-04-12 DIAGNOSIS — L918 Other hypertrophic disorders of the skin: Secondary | ICD-10-CM | POA: Diagnosis not present

## 2024-04-12 DIAGNOSIS — L738 Other specified follicular disorders: Secondary | ICD-10-CM

## 2024-04-12 DIAGNOSIS — L821 Other seborrheic keratosis: Secondary | ICD-10-CM | POA: Diagnosis not present

## 2024-04-12 DIAGNOSIS — L720 Epidermal cyst: Secondary | ICD-10-CM

## 2024-04-12 NOTE — Progress Notes (Signed)
" ° °  New Patient Visit   Subjective  Mackenzie Clark is a 59 y.o. female who presents for a NEW PATIENT appointment to be examined for the concerns as listed below.   Skin Tags: Located under the L breast that has presented about 2 years ago. They are itchy - rating it 5/10.  Spot Check: Pt has 2 spots on the L cheek that she would like evaluated that presented about 3mo ago.  Pt has not seen a dermatologist previously.    The following portions of the chart were reviewed this encounter and updated as appropriate: medications, allergies, medical history  Review of Systems:  No other skin or systemic complaints except as noted in HPI or Assessment and Plan.  Objective  Well appearing patient in no apparent distress; mood and affect are within normal limits.   A focused examination was performed of the following areas: Face & L breast   Relevant exam findings are noted in the Assessment and Plan.         Assessment & Plan   SEBORRHEIC KERATOSIS - Stuck-on, waxy, tan-brown papules and/or plaques  - Benign-appearing - Discussed benign etiology and prognosis. - Observe - Call for any changes  Sebaceous Hyperplasia - Small yellow papules with a central dell - Benign-appearing - Observe. Call for changes. - Recommend Avene Retinol - samples provided  Milia - tiny firm white papules - type of cyst - benign - sometimes these will clear with nightly OTC adapalene/Differin 0.1% gel or retinol. - may be extracted if symptomatic - observe  Acrochordons (Skin Tags) - Fleshy, skin-colored pedunculated papules - Benign appearing.  - Observe. - If desired, they can be removed with an in office procedure that is not covered by insurance. - Please call the clinic if you notice any new or changing lesions.    No follow-ups on file.   Documentation: I have reviewed the above documentation for accuracy and completeness, and I agree with the above.  I, Shirron Maranda,  CMA II, am acting as scribe for:   Delon Lenis, DO        "

## 2024-04-12 NOTE — Patient Instructions (Signed)

## 2024-04-14 ENCOUNTER — Telehealth: Admitting: Family Medicine

## 2024-04-14 DIAGNOSIS — H109 Unspecified conjunctivitis: Secondary | ICD-10-CM | POA: Diagnosis not present

## 2024-04-14 MED ORDER — POLYMYXIN B-TRIMETHOPRIM 10000-0.1 UNIT/ML-% OP SOLN: 1.0000 [drp] | OPHTHALMIC | 0 refills | Status: AC

## 2024-04-14 NOTE — Patient Instructions (Signed)
 " Mackenzie Clark, thank you for joining Roosvelt Mater, PA-C for today's virtual visit.  While this provider is not your primary care provider (PCP), if your PCP is located in our provider database this encounter information will be shared with them immediately following your visit.   A [facility] MyChart account gives you access to today's visit and all your visits, tests, and labs performed at [facility]  click here if you don't have a [facility] MyChart account or go to [email or web address]  Consent: (Patient) Mackenzie Clark provided verbal consent for this virtual visit at the beginning of the encounter.  Current Medications:  Current Outpatient Medications:    trimethoprim -polymyxin b  (POLYTRIM ) ophthalmic solution, Place 1 drop into the left eye every 4 (four) hours for 7 days., Disp: 10 mL, Rfl: 0   amLODipine  (NORVASC ) 10 MG tablet, Take 1 tablet (10 mg total) by mouth daily., Disp: 90 tablet, Rfl: 2   Bioflavonoid Products (VITAMIN C) CHEW, Chew 1 tablet by mouth 3 (three) times a week., Disp: , Rfl:    buPROPion  (WELLBUTRIN  XL) 150 MG 24 hr tablet, Take 1 tablet (150 mg total) by mouth every morning., Disp: 90 tablet, Rfl: 1   diclofenac  Sodium (VOLTAREN  ARTHRITIS PAIN) 1 % GEL, Apply 2 g topically 4 (four) times daily., Disp: 100 g, Rfl: 0   fluticasone  (FLONASE ) 50 MCG/ACT nasal spray, Place 2 sprays into both nostrils daily. (Patient taking differently: Place 2 sprays into both nostrils daily as needed for allergies.), Disp: 54 g, Rfl: 2   glucose blood test strip, 1 each by Other route 2 (two) times daily. Use as instructed, Disp: , Rfl:    olmesartan -hydrochlorothiazide  (BENICAR  HCT) 40-25 MG tablet, Take 1 tablet by mouth daily., Disp: 90 tablet, Rfl: 2   rosuvastatin  (CRESTOR ) 5 MG tablet, Take 1 tablet (5 mg total) by mouth daily., Disp: 30 tablet, Rfl: 0   tirzepatide  (MOUNJARO ) 15 MG/0.5ML Pen, Inject 15 mg into the skin once a week., Disp: 6 mL, Rfl: 1    valACYclovir  (VALTREX ) 1000 MG tablet, Take 1 tablet (1,000 mg total) by mouth 2 (two) times daily as needed., Disp: 30 tablet, Rfl: 0   Medications ordered in this encounter:  Meds ordered this encounter  Medications   trimethoprim -polymyxin b  (POLYTRIM ) ophthalmic solution    Sig: Place 1 drop into the left eye every 4 (four) hours for 7 days.    Dispense:  10 mL    Refill:  0     *If you need refills on other medications prior to your next appointment, please contact your pharmacy*  Follow-Up: Call back or seek an in-person evaluation if the symptoms worsen or if the condition fails to improve as anticipated.  [facility] [phone]  Other Instructions Bacterial Conjunctivitis, Adult Bacterial conjunctivitis is an infection of the clear membrane that covers the white part of the eye and the inner surface of the eyelid (conjunctiva). When the blood vessels in the conjunctiva become inflamed, the eye becomes red or pink. The eye often feels irritated or itchy. Bacterial conjunctivitis spreads easily from person to person (is contagious). It also spreads easily from one eye to the other eye. What are the causes? This condition is caused by bacteria. You may get the infection if you come into close contact with: A person who is infected with the bacteria. Items that are contaminated with the bacteria, such as a face towel, contact lens solution, or eye makeup. What increases the  risk? You are more likely to develop this condition if: You are exposed to other people who have the infection. You wear contact lenses. You have a sinus infection. You have had a recent eye injury or surgery. You have a weak body defense system (immune system). You have a medical condition that causes dry eyes. What are the signs or symptoms? Symptoms of this condition include: Thick, yellowish discharge from the eye. This may turn into a crust on the eyelid overnight and cause your  eyelids to stick together. Tearing or watery eyes. Itchy eyes. Burning feeling in your eyes. Eye redness. Swollen eyelids. Blurred vision. How is this diagnosed? This condition is diagnosed based on your symptoms and medical history. Your health care provider may also take a sample of discharge from your eye to find the cause of your infection. How is this treated? This condition may be treated with: Antibiotic eye drops or ointment to clear the infection more quickly and prevent the spread of infection to others. Antibiotic medicines taken by mouth (orally) to treat infections that do not respond to drops or ointments or that last longer than 10 days. Cool, wet cloths (cool compresses) placed on the eyes. Artificial tears applied 2-6 times a day. Follow these instructions at home: Medicines Take or apply your antibiotic medicine as told by your health care provider. Do not stop using the antibiotic, even if your condition improves, unless directed by your health care provider. Take or apply over-the-counter and prescription medicines only as told by your health care provider. Be very careful to avoid touching the edge of your eyelid with the eye-drop bottle or the ointment tube when you apply medicines to the affected eye. This will keep you from spreading the infection to your other eye or to other people. Managing discomfort Gently wipe away any drainage from your eye with a warm, wet washcloth or a cotton ball. Apply a clean, cool compress to your eye for 10-20 minutes, 3-4 times a day. General instructions Do not wear contact lenses until the inflammation is gone and your health care provider says it is safe to wear them again. Ask your health care provider how to sterilize or replace your contact lenses before you use them again. Wear glasses until you can resume wearing contact lenses. Avoid wearing eye makeup until the inflammation is gone. Throw away any old eye cosmetics that may  be contaminated. Change or wash your pillowcase every day. Do not share towels or washcloths. This may spread the infection. Wash your hands often with soap and water for at least 20 seconds and especially before touching your face or eyes. Use paper towels to dry your hands. Avoid touching or rubbing your eyes. Do not drive or use heavy machinery if your vision is blurred. Contact a health care provider if: You have a fever. Your symptoms do not get better after 10 days. Get help right away if: You have a fever and your symptoms suddenly get worse. You have severe pain when you move your eye. You have facial pain, redness, or swelling. You have a sudden loss of vision. Summary Bacterial conjunctivitis is an infection of the clear membrane that covers the white part of the eye and the inner surface of the eyelid (conjunctiva). Bacterial conjunctivitis spreads easily from eye to eye and from person to person (is contagious). Wash your hands often with soap and water for at least 20 seconds and especially before touching your face or eyes. Use paper towels  to dry your hands. Take or apply your antibiotic medicine as told by your health care provider. Do not stop using the antibiotic even if your condition improves. Contact a health care provider if you have a fever or if your symptoms do not get better after 10 days. Get help right away if you have a sudden loss of vision. This information is not intended to replace advice given to you by your health care provider. Make sure you discuss any questions you have with your health care provider. Document Revised: 07/02/2020 Document Reviewed: 07/02/2020 Elsevier Patient Education  2024 [employer]   If you have been instructed to have an in-person evaluation today at a local Urgent Care facility, please use the link below. It will take you to a list of all of our available [facility], including address, phone number and hours of  operation. Please do not delay care.  [facility]  If you or a family member do not have a primary care provider, use the link below to schedule a visit and establish care. When you choose a [facility] primary care physician or advanced practice provider, you gain a long-term partner in health. Find a Primary Care Provider  Learn more about [facility]'s in-office and virtual care options: [facility] Get [facility] Now  "

## 2024-04-14 NOTE — Progress Notes (Signed)
 " Virtual Visit Consent   Mackenzie Clark, you are scheduled for a virtual visit with a [facility] provider today. Just as with appointments in the office, your consent must be obtained to participate. Your consent will be active for this visit and any virtual visit you may have with one of our providers in the next 365 days. If you have a MyChart account, a copy of this consent can be sent to you electronically.  As this is a virtual visit, video technology does not allow for your provider to perform a traditional examination. This may limit your provider's ability to fully assess your condition. If your provider identifies any concerns that need to be evaluated in person or the need to arrange testing (such as labs, EKG, etc.), we will make arrangements to do so. Although advances in technology are sophisticated, we cannot ensure that it will always work on either your end or our end. If the connection with a video visit is poor, the visit may have to be switched to a telephone visit. With either a video or telephone visit, we are not always able to ensure that we have a secure connection.  By engaging in this virtual visit, you consent to the provision of healthcare and authorize for your insurance to be billed (if applicable) for the services provided during this visit. Depending on your insurance coverage, you may receive a charge related to this service.  I need to obtain your verbal consent now. Are you willing to proceed with your visit today? Mackenzie Clark has provided verbal consent on 04/14/2024 for a virtual visit (video or telephone). Roosvelt Mater, [place]  Date: 04/14/2024 12:07 PM   Virtual Visit via Video Note   I, Roosvelt Mater, connected with  Mackenzie Clark  ([id number], 12-Jul-1965) on 04/14/2024 at 12:00 PM EST by a video-enabled telemedicine application and verified that I am speaking with the correct person using two identifiers.  Location: Patient: Virtual Visit Location  Patient: Home Provider: Virtual Visit Location Provider: Home Office   I discussed the limitations of evaluation and management by telemedicine and the availability of in person appointments. The patient expressed understanding and agreed to proceed.    History of Present Illness: Mackenzie Clark is a 59 y.o. who identifies as a female who was assigned female at birth, and is being seen today for c/o wearing contacts and had a pain soaking in 3% peroxide solution and placed the contact lense into her eye and immediately removed it.  Pt denies vision changes.  Pt denies eye pain but sore to the touch.  Pt states it is just her left eye.  Pt states has discharge from left eye and work up this morning with her eye matted shut.   HPI: HPI  Problems:  Patient Active Problem List   Diagnosis Date Noted   Status post shoulder surgery 07/12/2023   History of hematuria 03/01/2023   Encounter for general adult medical examination w/o abnormal findings 11/01/2022   Aortic atherosclerosis 11/01/2022   Anxiety 11/01/2022   Vitamin D  deficiency disease 11/01/2022   Irregular menses 11/01/2022   Sprain of right ankle 11/01/2022   Pure hypercholesterolemia 05/06/2022   Overweight with body mass index (BMI) of 29 to 29.9 in adult 01/22/2021   Dyslipidemia associated with type 2 diabetes mellitus (HCC) 01/22/2021   Fibroids 08/31/2019   Abnormal uterine bleeding due to intramural leiomyoma 08/31/2019   Abnormal uterine bleeding (AUB) 05/21/2019   Uterine leiomyoma 05/21/2019  Body mass index (BMI) of 30.0-30.9 in adult 09/03/2017   Type II diabetes mellitus, uncontrolled 09/03/2017   Obesity 09/03/2017   Hypertensive heart disease 03/05/2013   Surveillance of previously prescribed contraceptive pill 03/05/2013    Allergies: Allergies[1] Medications: Current Medications[2]  Observations/Objective: Patient is well-developed, well-nourished in no acute distress.  Resting comfortably  at home.   Head is normocephalic, atraumatic.  No labored breathing.  Speech is clear and coherent with logical content.  Patient is alert and oriented at baseline.    Assessment and Plan: 1. Bacterial conjunctivitis of left eye (Primary) - trimethoprim -polymyxin b  (POLYTRIM ) ophthalmic solution; Place 1 drop into the left eye every 4 (four) hours for 7 days.  Dispense: 10 mL; Refill: 0  -Pt advised to follow up with in person urgent care or ophthalmologist for worsening symptoms.   Follow Up Instructions: I discussed the assessment and treatment plan with the patient. The patient was provided an opportunity to ask questions and all were answered. The patient agreed with the plan and demonstrated an understanding of the instructions.  A copy of instructions were sent to the patient via MyChart unless otherwise noted below.    The patient was advised to call back or seek an in-person evaluation if the symptoms worsen or if the condition fails to improve as anticipated.    Latarshia [place], PA-C    [1]  Allergies Allergen Reactions   Shellfish Allergy Swelling  [2]  Current Outpatient Medications:    trimethoprim -polymyxin b  (POLYTRIM ) ophthalmic solution, Place 1 drop into the left eye every 4 (four) hours for 7 days., Disp: 10 mL, Rfl: 0   amLODipine  (NORVASC ) 10 MG tablet, Take 1 tablet (10 mg total) by mouth daily., Disp: 90 tablet, Rfl: 2   Bioflavonoid Products (VITAMIN C) CHEW, Chew 1 tablet by mouth 3 (three) times a week., Disp: , Rfl:    buPROPion  (WELLBUTRIN  XL) 150 MG 24 hr tablet, Take 1 tablet (150 mg total) by mouth every morning., Disp: 90 tablet, Rfl: 1   diclofenac  Sodium (VOLTAREN  ARTHRITIS PAIN) 1 % GEL, Apply 2 g topically 4 (four) times daily., Disp: 100 g, Rfl: 0   fluticasone  (FLONASE ) 50 MCG/ACT nasal spray, Place 2 sprays into both nostrils daily. (Patient taking differently: Place 2 sprays into both nostrils daily as needed for allergies.), Disp: 54 g, Rfl: 2   glucose  blood test strip, 1 each by Other route 2 (two) times daily. Use as instructed, Disp: , Rfl:    olmesartan -hydrochlorothiazide  (BENICAR  HCT) 40-25 MG tablet, Take 1 tablet by mouth daily., Disp: 90 tablet, Rfl: 2   rosuvastatin  (CRESTOR ) 5 MG tablet, Take 1 tablet (5 mg total) by mouth daily., Disp: 30 tablet, Rfl: 0   tirzepatide  (MOUNJARO ) 15 MG/0.5ML Pen, Inject 15 mg into the skin once a week., Disp: 6 mL, Rfl: 1   valACYclovir  (VALTREX ) 1000 MG tablet, Take 1 tablet (1,000 mg total) by mouth 2 (two) times daily as needed., Disp: 30 tablet, Rfl: 0  "

## 2024-11-27 ENCOUNTER — Encounter: Payer: Self-pay | Admitting: Internal Medicine
